# Patient Record
Sex: Female | Born: 1965 | Race: White | Hispanic: No | Marital: Married | Smoking: Former smoker
Health system: Southern US, Academic
[De-identification: ages and names within clinical notes are randomized; demographics above are authoritative.]

## PROBLEM LIST (undated history)

## (undated) DIAGNOSIS — E079 Disorder of thyroid, unspecified: Secondary | ICD-10-CM

## (undated) DIAGNOSIS — N649 Disorder of breast, unspecified: Secondary | ICD-10-CM

## (undated) DIAGNOSIS — N6009 Solitary cyst of unspecified breast: Secondary | ICD-10-CM

## (undated) DIAGNOSIS — M3500 Sicca syndrome, unspecified: Secondary | ICD-10-CM

## (undated) DIAGNOSIS — K219 Gastro-esophageal reflux disease without esophagitis: Secondary | ICD-10-CM

## (undated) DIAGNOSIS — Z658 Other specified problems related to psychosocial circumstances: Secondary | ICD-10-CM

## (undated) DIAGNOSIS — I38 Endocarditis, valve unspecified: Secondary | ICD-10-CM

## (undated) DIAGNOSIS — M1990 Unspecified osteoarthritis, unspecified site: Secondary | ICD-10-CM

## (undated) DIAGNOSIS — E039 Hypothyroidism, unspecified: Secondary | ICD-10-CM

## (undated) HISTORY — DX: Solitary cyst of unspecified breast: N60.09

## (undated) HISTORY — DX: Disorder of breast, unspecified: N64.9

## (undated) HISTORY — DX: Hypothyroidism, unspecified: E03.9

## (undated) HISTORY — DX: Other specified problems related to psychosocial circumstances: Z65.8

## (undated) HISTORY — DX: Sjogren syndrome, unspecified (CMS HCC): M35.00

## (undated) HISTORY — PX: HX DILATION AND CURETTAGE: SHX78

## (undated) HISTORY — PX: HX CYST INCISION AND DRAINAGE: SHX14

## (undated) HISTORY — PX: HX BREAST BIOPSY: SHX20

---
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
## 1994-08-27 ENCOUNTER — Ambulatory Visit (HOSPITAL_COMMUNITY): Payer: Self-pay | Admitting: Internal Medicine

## 1996-12-21 ENCOUNTER — Other Ambulatory Visit: Payer: Self-pay

## 1999-10-22 DIAGNOSIS — N879 Dysplasia of cervix uteri, unspecified: Secondary | ICD-10-CM

## 1999-10-22 HISTORY — DX: Dysplasia of cervix uteri, unspecified: N87.9

## 1999-11-29 ENCOUNTER — Ambulatory Visit (INDEPENDENT_AMBULATORY_CARE_PROVIDER_SITE_OTHER): Payer: Self-pay

## 1999-12-04 ENCOUNTER — Ambulatory Visit (INDEPENDENT_AMBULATORY_CARE_PROVIDER_SITE_OTHER): Payer: Self-pay | Admitting: RHEUMATOLOGY

## 1999-12-27 ENCOUNTER — Ambulatory Visit (INDEPENDENT_AMBULATORY_CARE_PROVIDER_SITE_OTHER): Payer: Self-pay | Admitting: RHEUMATOLOGY

## 2000-06-19 ENCOUNTER — Ambulatory Visit (HOSPITAL_BASED_OUTPATIENT_CLINIC_OR_DEPARTMENT_OTHER): Payer: Self-pay

## 2001-06-01 ENCOUNTER — Ambulatory Visit (HOSPITAL_BASED_OUTPATIENT_CLINIC_OR_DEPARTMENT_OTHER): Payer: Self-pay

## 2001-06-01 ENCOUNTER — Other Ambulatory Visit: Payer: Self-pay

## 2001-06-01 ENCOUNTER — Ambulatory Visit (INDEPENDENT_AMBULATORY_CARE_PROVIDER_SITE_OTHER): Payer: Self-pay

## 2001-06-11 ENCOUNTER — Ambulatory Visit (HOSPITAL_BASED_OUTPATIENT_CLINIC_OR_DEPARTMENT_OTHER): Payer: Self-pay

## 2002-03-22 ENCOUNTER — Ambulatory Visit (INDEPENDENT_AMBULATORY_CARE_PROVIDER_SITE_OTHER): Payer: Self-pay

## 2002-03-23 ENCOUNTER — Other Ambulatory Visit: Payer: Self-pay

## 2002-03-23 ENCOUNTER — Ambulatory Visit (HOSPITAL_BASED_OUTPATIENT_CLINIC_OR_DEPARTMENT_OTHER): Payer: Self-pay

## 2002-04-26 ENCOUNTER — Ambulatory Visit (HOSPITAL_BASED_OUTPATIENT_CLINIC_OR_DEPARTMENT_OTHER): Payer: Self-pay

## 2002-04-29 ENCOUNTER — Ambulatory Visit (HOSPITAL_BASED_OUTPATIENT_CLINIC_OR_DEPARTMENT_OTHER): Payer: Self-pay

## 2003-05-27 ENCOUNTER — Ambulatory Visit (INDEPENDENT_AMBULATORY_CARE_PROVIDER_SITE_OTHER): Payer: Self-pay

## 2003-06-06 ENCOUNTER — Ambulatory Visit (INDEPENDENT_AMBULATORY_CARE_PROVIDER_SITE_OTHER): Payer: Self-pay

## 2003-06-15 ENCOUNTER — Ambulatory Visit (INDEPENDENT_AMBULATORY_CARE_PROVIDER_SITE_OTHER): Payer: Self-pay

## 2003-09-29 ENCOUNTER — Ambulatory Visit (INDEPENDENT_AMBULATORY_CARE_PROVIDER_SITE_OTHER): Payer: Self-pay

## 2003-10-12 ENCOUNTER — Ambulatory Visit (INDEPENDENT_AMBULATORY_CARE_PROVIDER_SITE_OTHER): Payer: Self-pay

## 2003-10-12 HISTORY — PX: BIOPSY BREAST: PRO8

## 2004-06-06 ENCOUNTER — Ambulatory Visit (INDEPENDENT_AMBULATORY_CARE_PROVIDER_SITE_OTHER): Payer: Self-pay

## 2004-12-10 ENCOUNTER — Other Ambulatory Visit (INDEPENDENT_AMBULATORY_CARE_PROVIDER_SITE_OTHER): Payer: Self-pay

## 2004-12-28 ENCOUNTER — Other Ambulatory Visit (INDEPENDENT_AMBULATORY_CARE_PROVIDER_SITE_OTHER): Payer: Self-pay

## 2005-09-09 ENCOUNTER — Ambulatory Visit (INDEPENDENT_AMBULATORY_CARE_PROVIDER_SITE_OTHER): Payer: Self-pay

## 2006-08-25 ENCOUNTER — Ambulatory Visit (HOSPITAL_COMMUNITY): Payer: Self-pay

## 2006-09-04 ENCOUNTER — Ambulatory Visit (HOSPITAL_BASED_OUTPATIENT_CLINIC_OR_DEPARTMENT_OTHER): Payer: Self-pay

## 2007-08-12 ENCOUNTER — Other Ambulatory Visit (INDEPENDENT_AMBULATORY_CARE_PROVIDER_SITE_OTHER): Payer: Self-pay

## 2007-09-28 ENCOUNTER — Ambulatory Visit (HOSPITAL_BASED_OUTPATIENT_CLINIC_OR_DEPARTMENT_OTHER): Payer: No Typology Code available for payment source

## 2007-09-28 ENCOUNTER — Ambulatory Visit (INDEPENDENT_AMBULATORY_CARE_PROVIDER_SITE_OTHER)
Admission: RE | Admit: 2007-09-28 | Discharge: 2007-09-28 | Disposition: A | Discharge status: Home / Self Care | Payer: No Typology Code available for payment source | Source: Ambulatory Visit

## 2007-09-28 ENCOUNTER — Ambulatory Visit (INDEPENDENT_AMBULATORY_CARE_PROVIDER_SITE_OTHER): Payer: No Typology Code available for payment source

## 2007-09-28 ENCOUNTER — Ambulatory Visit
Admission: RE | Admit: 2007-09-28 | Discharge: 2007-09-28 | Disposition: A | Discharge status: Home / Self Care | Payer: No Typology Code available for payment source

## 2007-09-28 DIAGNOSIS — Z124 Encounter for screening for malignant neoplasm of cervix: Secondary | ICD-10-CM

## 2007-09-28 DIAGNOSIS — N6009 Solitary cyst of unspecified breast: Secondary | ICD-10-CM

## 2007-09-28 DIAGNOSIS — Z01419 Encounter for gynecological examination (general) (routine) without abnormal findings: Secondary | ICD-10-CM

## 2007-10-01 LAB — CYTOPATHOLOGY, GYN +/- HIGH RISK HPV

## 2007-11-03 ENCOUNTER — Ambulatory Visit (HOSPITAL_BASED_OUTPATIENT_CLINIC_OR_DEPARTMENT_OTHER): Payer: No Typology Code available for payment source | Admitting: GENERAL SURGERY

## 2008-01-27 ENCOUNTER — Encounter (EMERGENCY_DEPARTMENT_HOSPITAL): Payer: No Typology Code available for payment source | Admitting: Internal Medicine

## 2008-01-27 ENCOUNTER — Observation Stay
Admission: EM | Admit: 2008-01-27 | Discharge: 2008-01-28 | Disposition: A | Discharge status: Home / Self Care | Payer: No Typology Code available for payment source | Source: Emergency Department | Attending: Internal Medicine | Admitting: Internal Medicine

## 2008-01-27 ENCOUNTER — Encounter (HOSPITAL_COMMUNITY): Payer: Self-pay

## 2008-01-27 ENCOUNTER — Emergency Department (EMERGENCY_DEPARTMENT_HOSPITAL): Payer: No Typology Code available for payment source

## 2008-01-27 DIAGNOSIS — Z87891 Personal history of nicotine dependence: Secondary | ICD-10-CM | POA: Insufficient documentation

## 2008-01-27 DIAGNOSIS — I471 Supraventricular tachycardia, unspecified: Secondary | ICD-10-CM | POA: Insufficient documentation

## 2008-01-27 DIAGNOSIS — E039 Hypothyroidism, unspecified: Secondary | ICD-10-CM | POA: Insufficient documentation

## 2008-01-27 DIAGNOSIS — M3500 Sicca syndrome, unspecified: Secondary | ICD-10-CM | POA: Insufficient documentation

## 2008-01-27 DIAGNOSIS — Z7982 Long term (current) use of aspirin: Secondary | ICD-10-CM | POA: Insufficient documentation

## 2008-01-27 DIAGNOSIS — E785 Hyperlipidemia, unspecified: Secondary | ICD-10-CM | POA: Insufficient documentation

## 2008-01-27 DIAGNOSIS — R072 Precordial pain: Principal | ICD-10-CM | POA: Insufficient documentation

## 2008-01-27 LAB — CBC/DIFF
BASOPHILS: 1 % (ref 0–1)
BASOS ABS: 0.054 THOU/uL (ref 0.0–0.2)
EOS ABS: 0.112 THOU/uL (ref 0.1–0.3)
EOSINOPHIL: 2 % (ref 1–6)
HCT: 39.8 % (ref 33.5–45.2)
HGB: 13.9 g/dL (ref 11.2–15.2)
LYMPHOCYTES: 24 % (ref 20–45)
LYMPHS ABS: 1.27 THOU/uL (ref 1.0–4.8)
MCH: 30.5 pg (ref 27.4–33.0)
MCHC: 34.8 g/dL (ref 31.6–35.5)
MCV: 87.6 fL (ref 78–100)
MONOCYTES: 9 % (ref 4–13)
MONOS ABS: 0.508 10*3/uL (ref 0.1–0.9)
MPV: 9.1 FL (ref 7.4–10.4)
PLATELET COUNT: 212 10*3/uL (ref 140–450)
PMN ABS: 3.42 THOU/uL (ref 1.3–7.7)
PMN'S: 64 % (ref 40–75)
RBC: 4.55 MIL/uL (ref 3.84–5.04)
RDW: 11.8 % (ref 11.5–14.5)
WBC: 5.4 THOU/UL (ref 3.5–11.0)

## 2008-01-27 LAB — THYROID STIMULATING HORMONE (SENSITIVE TSH): TSH: 6.597 u[IU]/mL — ABNORMAL HIGH (ref 0.300–5.900)

## 2008-01-27 LAB — BASIC METABOLIC PANEL
BUN/CREAT RATIO: 13 (ref 6–22)
BUN: 11 mg/dL (ref 6–20)
POTASSIUM: 3 mmol/L — ABNORMAL LOW (ref 3.5–5.1)

## 2008-01-27 LAB — PT/INR
INR: 0.9 (ref 0.8–1.2)
PROTHROMBIN TIME: 9.6 s (ref 9.1–11.2)

## 2008-01-27 LAB — CREATINE KINASE (CK), TOTAL, SERUM OR PLASMA: CREATINE KINASE (CK): 47 U/L (ref 24–170)

## 2008-01-27 LAB — PTT (PARTIAL THROMBOPLASTIN TIME): APTT: 24.7 s (ref 22.5–32.0)

## 2008-01-27 LAB — LIPID PANEL
CHOLESTEROL: 223 mg/dL — ABNORMAL HIGH (ref <200)
LDL (CALCULATED): 149 mg/dL — ABNORMAL HIGH (ref <100)
TRIGLYCERIDES: 97 mg/dL (ref <150)

## 2008-01-27 LAB — CREATINE KINASE (CK), MB FRACTION, SERUM
CK-MB: 0.6 ng/mL (ref <6.4)
MB INDEX: 1.3 % (ref 0.0–5.0)

## 2008-01-27 LAB — TROPONIN-I (FOR ED ONLY): TROPONIN-I: <0.01 ng/mL (ref <0.050)

## 2008-01-27 MED ORDER — ACETAMINOPHEN 325 MG TABLET
325.00 mg | ORAL_TABLET | ORAL | Status: DC | PRN
Start: 2008-01-27 — End: 2008-01-29

## 2008-01-27 MED ORDER — ASPIRIN 81 MG CHEWABLE TABLET
81.0000 mg | CHEWABLE_TABLET | Freq: Every day | ORAL | Status: DC
Start: 2008-01-28 — End: 2008-01-29
  Administered 2008-01-28: 81 mg via ORAL
  Filled 2008-01-27: qty 1

## 2008-01-27 MED ORDER — CYCLOSPORINE 0.05 % EYE DROPS IN A DROPPERETTE
1.0000 [drp] | Freq: Two times a day (BID) | OPHTHALMIC | Status: DC
Start: 2008-01-27 — End: 2008-01-29
  Administered 2008-01-28 (×2): 1 [drp] via OPHTHALMIC
  Filled 2008-01-27 (×3): qty 32

## 2008-01-27 MED ORDER — SODIUM CHLORIDE 0.9 % INTRAVENOUS SOLUTION
INTRAVENOUS | Status: DC
Start: 2008-01-27 — End: 2008-01-29

## 2008-01-27 MED ORDER — LEVOTHYROXINE 25 MCG TABLET
25.0000 ug | ORAL_TABLET | Freq: Every morning | ORAL | Status: DC
Start: 2008-01-28 — End: 2008-01-28
  Administered 2008-01-28 (×2): 0 ug via ORAL
  Filled 2008-01-27 (×2): qty 1

## 2008-01-27 MED ORDER — SIMVASTATIN 20 MG TABLET
40.0000 mg | ORAL_TABLET | Freq: Every evening | ORAL | Status: DC
Start: 2008-01-28 — End: 2008-01-29
  Administered 2008-01-28: 0 mg via ORAL
  Filled 2008-01-27 (×2): qty 2

## 2008-01-27 MED ORDER — POTASSIUM CHLORIDE ER 20 MEQ TABLET,EXTENDED RELEASE(PART/CRYST)
60.00 meq | ORAL_TABLET | Freq: Once | ORAL | Status: AC
Start: 2008-01-27 — End: 2008-01-27
  Administered 2008-01-27: 60 meq via ORAL
  Filled 2008-01-27: qty 3

## 2008-01-27 MED ORDER — ESOMEPRAZOLE MAGNESIUM 40 MG CAPSULE,DELAYED RELEASE
40.0000 mg | DELAYED_RELEASE_CAPSULE | Freq: Every morning | ORAL | Status: DC
Start: 2008-01-28 — End: 2008-01-29
  Administered 2008-01-28: 40 mg via ORAL
  Filled 2008-01-27 (×2): qty 1

## 2008-01-27 MED ORDER — FLECAINIDE 100 MG TABLET
50.0000 mg | ORAL_TABLET | Freq: Two times a day (BID) | ORAL | Status: DC
Start: 2008-01-28 — End: 2008-01-29
  Administered 2008-01-28: 50 mg via ORAL
  Filled 2008-01-27 (×2): qty 0.5

## 2008-01-27 MED ORDER — NITROGLYCERIN 0.4 MG SUBLINGUAL TABLET
0.4000 mg | SUBLINGUAL_TABLET | SUBLINGUAL | Status: DC | PRN
Start: 2008-01-27 — End: 2008-01-29

## 2008-01-27 MED ORDER — PILOCARPINE 5 MG TABLET
7.50 mg | ORAL_TABLET | Freq: Two times a day (BID) | ORAL | Status: DC
Start: 2008-01-28 — End: 2008-01-29
  Administered 2008-01-28 (×2): 7.5 mg via ORAL
  Filled 2008-01-27 (×4): qty 5

## 2008-01-27 MED ORDER — HEPARIN (PORCINE) 5,000 UNIT/ML INJECTION SOLUTION
5000.0000 [IU] | Freq: Three times a day (TID) | INTRAMUSCULAR | Status: DC
Start: 2008-01-28 — End: 2008-01-29
  Administered 2008-01-28: 5000 [IU] via SUBCUTANEOUS
  Administered 2008-01-28: 0 [IU] via SUBCUTANEOUS
  Filled 2008-01-27 (×4): qty 1

## 2008-01-27 MED ORDER — ALUMINUM-MAG HYDROXIDE-SIMETHICONE 400 MG-400 MG-40 MG/5 ML ORAL SUSP
10.00 mL | ORAL | Status: DC | PRN
Start: 2008-01-27 — End: 2008-01-29

## 2008-01-27 MED ORDER — VITAMIN E 268 MG (400 UNIT) CAPSULE
400.0000 [IU] | ORAL_CAPSULE | Freq: Two times a day (BID) | ORAL | Status: DC
Start: 2008-01-28 — End: 2008-01-29
  Administered 2008-01-28: 11:00:00 400 [IU] via ORAL
  Administered 2008-01-28: 0 [IU] via ORAL
  Filled 2008-01-27 (×3): qty 1

## 2008-01-27 NOTE — ED Attending Handoff Note (Signed)
40 female CP    Admittted to medicine

## 2008-01-27 NOTE — ED Nurses Note (Signed)
2000 #20 heplock inserted in LAC.  Labs drawn and urine specimen obtained and sent to lab.  States shaking started but now no longer shaking.  Some numbness on left jaw starting at 1630.  Now going away.  Had 2 81 mg ASA.  Family at bsd.  Also had dry cough, started when numbness started.  Has non-producing tear ducts, saliva glands.    2100  Dr in to evaluate pt who is awaiting further disposition.  Family with pt.  No further shaking and pt denies distress or pain at this time.    2300  Awaiting transfer to floor.    Note Created by Lorri Frederick, RN - Pended Note filed incomplete by provider for HIM Michelyn Scullin.

## 2008-01-27 NOTE — ED Resident Handoff Note (Signed)
CP left sided with associated syx  No previous work up  Flecainide for arrythmia  Most likely CP admission  (find out PCP)    Medicine consult in progress      Admitted to medicine    I have monitored the events of this patient with no acute events.  Management plan executed as indicated from primary resident plan.

## 2008-01-27 NOTE — ED Provider Notes (Signed)
HPI  CC: 42 y.o. female presents to the ED on 01/27/2008 w/ Chest Pain (Angina)      BP 140/80   Pulse 56   Temp 36.8 C (98.2 F)   Resp 18   Ht 1.702 m (5\' 7" )   Wt 74.844 kg (165 lb)   SpO2 100%   LMP 01/27/2008     HPI: Patient states that today while she was at work at 4:30 PM she developed a left-sided chest pressure with some sharp component going back into her left scapula as well as in her left arm. She also developed some nausea and some lightheadedness. She took some baby aspirin at home and took a flecainide pill that she is prescribed for a heart arrhythmia. Her husband then brought her to the emergency department. She has had the pressure component her chest before but never the sharp component. She does have a strong family history of coronary artery disease at young age mostly in the men.  He just states at one point she developed some numbness in her left face. She wasn't sure if this was related to her TMJ or Sjogren's syndrome.        ROS  Constitutional: no fever, no chills.   Skin: Negative for rash and itching.   HENT: no headaches, no nosebleeds.    Eyes: no blurred vision, no double vision.   Cardiovascular: + chest pain, no palpitations.   Respiratory: + shortness of breath, no wheezing.   Gastrointestinal: + nausea, no vomiting, no abdominal pain.   Musculoskeletal: no neck pain, no back pain.   All other systems reviewed and are negative, except those noted in HPI.        History:  Past Medical History   Diagnosis Date    Chest Pain 01/28/2008     Some sort of arrhythmia which she cannot identify.    Past Surgical History   Procedure Date    Hx tonsillectomy           MEDS: Previous Medications    ALPHA-LIPOIC ACID PO    take by mouth.     AMILORIDE-HYDROCHLOROTHIAZIDE 5-50 MG TAB    take 1 Tab by mouth Once a day.     ASPIRIN 81 MG CHEW    take 81 mg by mouth Once a day.     FLECAINIDE (TAMBOCOR) 50 MG TAB    take 50 mg by mouth Twice daily.     KLOR-CON M20 ORAL    take by mouth.     PILOCARPINE HCL 7.5 MG TAB    take by mouth.     SUPER B COMPLEX PO    take by mouth.     VITAMIN E (AQUASOL E) 400 UNIT CAP    take 400 Units by mouth Once a day.     ZINC 50 MG TAB    take by mouth.             ALLERGIES: Tetracycline     SocHx: History   Substance Use Topics    Tobacco Use: Never    Alcohol Use: No                  Physical Exam  Nursing chart/notes reviewed.    Blood pressure 91/53, pulse 58, temperature 36.6 C (97.9 F), resp. rate 20, height 1.753 m (5\' 9" ), weight 76.9 kg (169 lb 8.5 oz), last menstrual period 01/27/2008, SpO2 99%.   Constitutional: Pt is oriented. Pt appears well-developed and well-nourished.  HENT:    Head: Normocephalic and atraumatic.     Ears: External ears normal.    Eyes: Conjunctivae and extraocular motions are normal. Pupils are equal, round, and   reactive to light.   Neck: Normal range of motion. Neck supple.   Cardiovascular: Normal rate, regular rhythm, normal heart sounds and intact distal pulses.    NoO. murmur heard.  Pulmonary/Chest: Pt has no respiratory distress. Pt has no wheezes. Pt has no rales.   Abdominal: Bowel sounds are normal. Pt exhibits no distension.  no tenderness to palpation. no rebound/guarding.  Musculoskeletal: Normal range of motion. Pt exhibits no LE edema. noa TTP.    Neurological: Pt is alert and oriented. Cranial nerves intact.  Motor and sensory intact.  Skin: Skin is warm and dry.   Psychiatric: Pt has a normal mood and affect. Their behavior is normal.        Course  Eval/tests/treatment:     Cardiac protocol, patient has already receive aspirin.  Meds that were given are noted on the [facility].    Results:  All test results reviewed.  Troponin negative, EKG normal, chest x-ray normal, potassium 3.0    Re-eval and treatment included:     60 mEq oral potassium    MDM:  Patient is a 42 year old female with chest pain is concerning for cardiac cause. She has a strong family history, but lacks any other risk factors. Due to the strong  family history believe that it admission at this time would be the appropriate disposition. She has a ready received aspirin, do to her low risk we will not begin anticoagulation at this time.    medicine called regarding admission to their service for following reasons:  Chest pain

## 2008-01-27 NOTE — ED Attending Note (Signed)
Note begun by:  Serina Cowper, MD 01/27/2008, 8:43 PM    I was physically present and directly supervised this patient's care.  Patient seen and examined with Dr Baltazar Apo.  Resident history and exam reviewed.   Key elements in addition to and/or correction of that documentation are as follows:    HPI :    42 y.o. female presents with chief complaint of chest pain. This is described as a tightness associated with nausea and shortness of breath. Radiates to L arm. Family history of CAD.  No other risk factors.     PE :   VS on presentation: Blood pressure 140/80, pulse 56, temperature 36.8 C (98.2 F), resp. rate 18, height 1.702 m (5\' 7" ), weight 74.844 kg (165 lb), last menstrual period 01/27/2008, SpO2 100%.   I have seen and examined with Dr Baltazar Apo and agree with his exam    Data/Test :    EKG : see ECG  Images Review by me : CXR  Image Reports Review by me : As above  Labs : see list    Review of Prior Data :       Prior Images : None  Prior EKG : None  Online Medical Records : [facility]  Transfer Docs/Images : None    Clinical Impression :   1. Chest pain.        ED Course :   Per Dr Colonel Bald note     Plan :   Per Dr Otho Perl note    Dispo :   Per Dr Otho Perl note    CRITICAL CARE : None

## 2008-01-28 ENCOUNTER — Encounter (HOSPITAL_COMMUNITY): Payer: Self-pay

## 2008-01-28 ENCOUNTER — Inpatient Hospital Stay (HOSPITAL_COMMUNITY): Payer: No Typology Code available for payment source

## 2008-01-28 DIAGNOSIS — R079 Chest pain, unspecified: Secondary | ICD-10-CM

## 2008-01-28 HISTORY — DX: Chest pain, unspecified: R07.9

## 2008-01-28 LAB — CBC/DIFF
BASOPHILS: 0 % (ref 0–1)
BASOS ABS: 0.023 THOU/uL (ref 0.0–0.2)
EOS ABS: 0.114 THOU/uL (ref 0.1–0.3)
EOSINOPHIL: 2 % (ref 1–6)
HCT: 35.4 % (ref 33.5–45.2)
HGB: 12.1 g/dL (ref 11.2–15.2)
LYMPHOCYTES: 26 % (ref 20–45)
LYMPHS ABS: 1.39 THOU/uL (ref 1.0–4.8)
MCH: 30.1 pg (ref 27.4–33.0)
MCHC: 34.2 g/dL (ref 31.6–35.5)
MCV: 88 fL (ref 78–100)
MONOCYTES: 10 % (ref 4–13)
MONOS ABS: 0.562 THOU/uL (ref 0.1–0.9)
MPV: 9.7 FL (ref 7.4–10.4)
PLATELET COUNT: 203 THO/UL (ref 140–450)
PMN ABS: 3.32 THOU/uL (ref 1.3–7.7)
PMN'S: 62 % (ref 40–75)
RBC: 4.02 MIL/uL (ref 3.84–5.04)
RDW: 11.8 % (ref 11.5–14.5)
WBC: 5.4 THOU/UL (ref 3.5–11.0)

## 2008-01-28 LAB — ELECTROLYTES
CARBON DIOXIDE: 28 mmol/L (ref 22–32)
CHLORIDE: 109 mmol/L (ref 96–111)
SODIUM: 142 mmol/L (ref 136–145)

## 2008-01-28 LAB — THYROID STIMULATING HORMONE WITH FREE T4 REFLEX: THYROID STIMULATING HORMONE WITH FREE T4 REFLEX: 6.749 u[IU]/mL — ABNORMAL HIGH (ref 0.300–5.900)

## 2008-01-28 LAB — H & H: HGB: 12.2 g/dL (ref 11.2–15.2)

## 2008-01-28 LAB — CREATINE KINASE (CK), MB FRACTION, SERUM: CK-MB: 0.4 ng/mL (ref <6.4)

## 2008-01-28 LAB — PHOSPHORUS: PHOSPHORUS: 3.8 mg/dL (ref 2.4–4.7)

## 2008-01-28 LAB — T UPTAKE: THYROID UPTAKE: 35.5 % (ref 32.0–49.0)

## 2008-01-28 LAB — CREATININE
CREATININE: 0.96 mg/dL (ref 0.49–1.10)
ESTIMATED GLOMERULAR FILTRATION RATE: >59 mL/min/{1.73_m2} (ref >59)

## 2008-01-28 LAB — CREATINE KINASE (CK), TOTAL, SERUM: CREATINE KINASE (CK): 39 U/L (ref 24–170)

## 2008-01-28 LAB — PTT (PARTIAL THROMBOPLASTIN TIME): APTT: 24.6 s (ref 22.5–32.0)

## 2008-01-28 LAB — BUN: BUN/CREAT RATIO: 13 (ref 6–22)

## 2008-01-28 LAB — TROPONIN-I: TROPONIN-I: <0.01 ng/mL (ref <0.050)

## 2008-01-28 LAB — MAGNESIUM: MAGNESIUM: 2.1 mg/dL (ref 1.7–2.5)

## 2008-01-28 LAB — CALCIUM: CALCIUM: 8.7 mg/dL (ref 8.5–10.4)

## 2008-01-28 MED ORDER — IBUPROFEN 400 MG TABLET
400.0000 mg | ORAL_TABLET | Freq: Four times a day (QID) | ORAL | Status: DC | PRN
Start: 2008-01-28 — End: 2008-01-29
  Filled 2008-01-28 (×2): qty 1

## 2008-01-28 NOTE — Nurses Notes (Signed)
Pt discharged to home, accompanied by husband.  Discharge instructions given and pt questions answered.

## 2008-01-29 NOTE — Discharge Summary (Signed)
[facility]                                 DEPARTMENT OF MEDICINE                                         DISCHARGE SUMMARY    PATIENT NAME: Tracie [name], Tracie [name]  HOSPITAL BJYNWG:[id number]  DATE OF BIRTH: 1966-08-16    ADMISSION DATE:01/27/2008  DISCHARGE DATE:01/28/2008    PRIMARY CARE PHYSICIAN:  Hayden Rasmussen MD    REFERRING PHYSICIAN:  Jackey Loge MD    DISCHARGE DIAGNOSES:  1.  Chest pain, ruled out for myocardial infarction with negative cardiac enzymes.  2.  Sjogren syndrome.  3.  Hyperlipidemia.  4.  Paroxysmal atrial tachycardia.    DISCHARGE MEDICATIONS:   No new medications were added to the patient's regimen:  1.. Amiloride/hydrochlorothiazide 5/50 one tablet once a day.    2.  Aspirin 81 mg p.o. daily.  3.  Flecainide 50 mg p.o. daily.  4.  Pilocarpine 7.5 mg p.o. daily.  5.  Klor-Con 20 mg p.o. daily.  6.  Vitamin E 400 units.  7.  Zinc.   8.  Super B.   9.  Alpha-Lipoic acid p.o.    KEY TESTS:    1.  Nuclear medicine MPS.   2.  D-dimer.     REASON FOR HOSPITALIZATION AND HOSPITAL COURSE:  The patient is a pleasant 42 year old white female with hyperlipidemia and positive family history of coronary artery disease who presented with substernal stabbing chest pain that has been going on for a while off and on but was not that severe.  This was not relieved by nitroglycerin in the Emergency Department. She was ruled out and admitted for an MI with serial negative cardiac enzymes.  Her EKG did not show any evidence of ischemia.  Her lipid panel showed cholesterol 223, LDL 149, however it did not look like it was a pure fasting study so we would suggest repeat the evaluation.  She underwent a myocardial perfusion scan the next day which showed a normal MPS without evidence of stress-induced ischemia, normal left ventricular ejection fraction.  Her TSH was 6.5 with a normal T4 and T3 uptake and hence was consistent with sick euthyroid.   She also underwent d-dimer since she had some shortness of breath and she is relatively young with a history of autoimmune disorders.  It was 1.1, has a high negative predictive value we considered it safe enough to rule out PE.  She will follow up with Dr. Seward Grater, appointment is already scheduled in the middle of May.  She did not exactly remember the date.  She is going to see Dr. Jackey Loge, her primary cardiologist who is planning to take her off of flecainide and see the response.  When she was admitted here, she was in normal sinus rhythm.      Danisha Brassfield, MD  Resident  [facility]    Genella Mech, MD  Associate Professor  [facility]    AN/lel/[id number];D: 01/28/2008 17:54:31; T: 01/29/2008 10:38:19    cc: Quentin Angst MD      [facility] [address]      [place], [place] [address]  Hayden Rasmussen MD      [address]       [place], [place] [address]

## 2008-05-18 ENCOUNTER — Ambulatory Visit
Admission: RE | Admit: 2008-05-18 | Discharge: 2008-05-18 | Disposition: A | Discharge status: Home / Self Care | Payer: No Typology Code available for payment source

## 2008-05-18 ENCOUNTER — Ambulatory Visit (INDEPENDENT_AMBULATORY_CARE_PROVIDER_SITE_OTHER): Payer: No Typology Code available for payment source

## 2008-05-18 ENCOUNTER — Other Ambulatory Visit (INDEPENDENT_AMBULATORY_CARE_PROVIDER_SITE_OTHER): Payer: Self-pay

## 2008-05-18 ENCOUNTER — Ambulatory Visit (INDEPENDENT_AMBULATORY_CARE_PROVIDER_SITE_OTHER): Payer: Self-pay

## 2008-05-18 DIAGNOSIS — N920 Excessive and frequent menstruation with regular cycle: Secondary | ICD-10-CM

## 2008-05-18 LAB — CBC
MCH: 29.5 pg (ref 27.4–33.0)
MCHC: 34.4 g/dL (ref 31.6–35.5)
MCV: 85.6 fL (ref 78–100)

## 2008-05-18 LAB — PROLACTIN: PROLACTIN: 4.9 ng/mL (ref 3–27)

## 2008-05-18 LAB — THYROID STIMULATING HORMONE (SENSITIVE TSH): TSH: 2.781 u[IU]/mL (ref 0.300–5.900)

## 2008-05-18 NOTE — Telephone Encounter (Signed)
Triage Queue message copied by Jackelyn Poling on Wed May 18, 2008 3:18 PM  ------   Message from: Barkley Bruns   Created: Wed May 18, 2008 3:12 PM    >> Barkley Bruns Wed May 18, 2008 3:12 pm  [place]. Patient needs to have a mammogram order sent over to [facility]. She isn't due until December but has a specific date she needs to request.

## 2008-05-18 NOTE — Telephone Encounter (Signed)
Called pt and informed her that the order was already sent to the [facility] care center.

## 2008-05-19 ENCOUNTER — Telehealth (HOSPITAL_BASED_OUTPATIENT_CLINIC_OR_DEPARTMENT_OTHER): Payer: Self-pay

## 2008-05-19 ENCOUNTER — Other Ambulatory Visit (HOSPITAL_BASED_OUTPATIENT_CLINIC_OR_DEPARTMENT_OTHER): Payer: Self-pay

## 2008-05-19 LAB — THYROXINE, FREE (FREE T4): THYROXINE, FREE (FREE T4): 0.87 ng/dL (ref 0.60–1.10)

## 2008-05-19 NOTE — Telephone Encounter (Signed)
Called patient and reviewed lab results:  TSH elevated.    Plan:  Add Free T 4 test to blood in lab            Start Synthroid one po daily #30 RF #1  (called at [facility] pharmacy in [place] at 1:10pm)            Lab slip maile to home address for repeat labs in 4 weeks (06/20/08)    Pc.

## 2008-05-19 NOTE — Progress Notes (Signed)
Cannot order - access denied.  Pc.

## 2008-06-02 ENCOUNTER — Other Ambulatory Visit (INDEPENDENT_AMBULATORY_CARE_PROVIDER_SITE_OTHER): Payer: No Typology Code available for payment source

## 2008-06-08 ENCOUNTER — Other Ambulatory Visit (INDEPENDENT_AMBULATORY_CARE_PROVIDER_SITE_OTHER): Payer: No Typology Code available for payment source

## 2008-06-08 ENCOUNTER — Ambulatory Visit (INDEPENDENT_AMBULATORY_CARE_PROVIDER_SITE_OTHER): Payer: Self-pay

## 2008-06-17 ENCOUNTER — Telehealth (INDEPENDENT_AMBULATORY_CARE_PROVIDER_SITE_OTHER): Payer: Self-pay

## 2008-06-17 ENCOUNTER — Ambulatory Visit
Admission: RE | Admit: 2008-06-17 | Discharge: 2008-06-17 | Disposition: A | Discharge status: Home / Self Care | Payer: No Typology Code available for payment source

## 2008-06-17 LAB — THYROID STIMULATING HORMONE (SENSITIVE TSH): TSH: 2.758 u[IU]/mL (ref 0.300–5.900)

## 2008-06-17 NOTE — Telephone Encounter (Signed)
Menometrorrhagia history; pelvic USG shows complex hemorrhagic cyst on left with thickened endo metrium 10.96.    Plan:  Endometrial biopsy next available.  Pc.

## 2008-06-20 ENCOUNTER — Ambulatory Visit (INDEPENDENT_AMBULATORY_CARE_PROVIDER_SITE_OTHER): Payer: Self-pay

## 2008-06-20 NOTE — Telephone Encounter (Signed)
Triage Queue message copied by Milas Kocher on Mon Jun 20, 2008 9:14 AM  ------   Message from: Holly Bodily   Created: Mon Jun 20, 2008 9:02 AM    >> Holly Bodily JWJ Jun 20, 2008 9:02 am  [facility] pt - The patient stated she had blood work done on 8.28.09 to check her thyroid. She is wanting to know if Pam wants her to continue taking the medication because she is going out of town on Wednesday and she will need a refill called into [facility] in [place]. Please advise pt at [phone]. Thank you

## 2008-06-20 NOTE — Telephone Encounter (Signed)
Reviewed all lab results since 01/27/08 with patient; Synthroid . started after 05/18/08 draw (WNL); last draw 06/17/08 - WNL.  Patient symptoms fatique and vasomotor sx's improved?    Plan:  D/C Synthroid for now;  Re-evaluate at next visit (07/13/08).    Carman Ching, NP

## 2008-07-13 ENCOUNTER — Other Ambulatory Visit (INDEPENDENT_AMBULATORY_CARE_PROVIDER_SITE_OTHER): Payer: Self-pay

## 2008-07-13 ENCOUNTER — Ambulatory Visit (INDEPENDENT_AMBULATORY_CARE_PROVIDER_SITE_OTHER): Payer: No Typology Code available for payment source

## 2008-07-13 ENCOUNTER — Other Ambulatory Visit
Admission: RE | Admit: 2008-07-13 | Discharge: 2008-07-13 | Disposition: A | Discharge status: Home / Self Care | Payer: No Typology Code available for payment source

## 2008-07-13 DIAGNOSIS — N920 Excessive and frequent menstruation with regular cycle: Secondary | ICD-10-CM | POA: Insufficient documentation

## 2008-07-13 DIAGNOSIS — N83209 Unspecified ovarian cyst, unspecified side: Secondary | ICD-10-CM

## 2008-07-13 NOTE — Progress Notes (Addendum)
Addended by: Carman Ching on: 07/13/2008 1:18:30 PM     Modules accepted: Orders

## 2008-07-13 NOTE — Progress Notes (Addendum)
Addended by: Carman Ching on: 07/13/2008 1:24:12 PM     Modules accepted: Orders, SmartSet

## 2008-07-13 NOTE — Progress Notes (Signed)
Subjective:     Patient ID:  Anyah Swallow is an 42 y.o. female   Chief Complaint:  Chief Complaint   Patient presents with   . Biopsy     endometrial           HPI  Menometrorrhgia HX in 5/09  06/03/08 Pelvic USG:  Endometrium 10.96; LOV complex cyst 1.5x2. X1.4 (no pelvic pain)  06/17/08 Labs:  TSH, CBC, PRL - all WNL.  Marland Kitchen      ROS  Objective:   Physical Exam  .Endometrial biopsy consent signed.  Exocervix cleansed with betadine solution x 3  Pipelle passed to 8cm without difficulty  Patient tolerated.  Assessment & Plan:     Encounter Diagnoses   Code Name Primary? Qualifier   . 626.2G Menometrorrhagia      Plan: ENDOMETRIAL BIOPSY, PATHOLOGY, INITIAL SURGICAL SPECIMEN         Specimen obtained and sent to pathology - call results.  Continue menses diary  Follow-up Pelvic USG 07/28/08.      Carman Ching, NP

## 2008-07-13 NOTE — Procedures (Addendum)
See progress note.

## 2008-07-14 LAB — HISTORICAL SURGICAL PATHOLOGY SPECIMEN

## 2008-07-15 ENCOUNTER — Telehealth (INDEPENDENT_AMBULATORY_CARE_PROVIDER_SITE_OTHER): Payer: Self-pay

## 2008-07-15 NOTE — Telephone Encounter (Signed)
Reviewed endometrial bx:  Secretory phase; no hyperplasia or atypia.    Plan:  Monitor menses pattern; no meds intervention            F/U USG for cyst.    Carman Ching, NP

## 2008-07-20 ENCOUNTER — Telehealth (INDEPENDENT_AMBULATORY_CARE_PROVIDER_SITE_OTHER): Payer: Self-pay

## 2008-07-20 ENCOUNTER — Ambulatory Visit (INDEPENDENT_AMBULATORY_CARE_PROVIDER_SITE_OTHER): Payer: No Typology Code available for payment source

## 2008-07-20 NOTE — Telephone Encounter (Signed)
Message says that she started her menses early and needed to R/S pelvic USG to 07/25/08.  Message did not tell me LMP.    Plan:  Called work # and CP# - left message on CP# that pelvic USG should be menstrual cycle day 7-10.  If this is correct, keep appt.    Call if questions at [facility] Office on Thursday.    Carman Ching, NP

## 2008-07-21 ENCOUNTER — Telehealth (HOSPITAL_BASED_OUTPATIENT_CLINIC_OR_DEPARTMENT_OTHER): Payer: Self-pay

## 2008-07-21 NOTE — Telephone Encounter (Signed)
Had to cancel F/U Pelvic USG for left small left complex cyst appt since Oct. 5 would be MC day 11.  Will wait until next menses and  R/S pelvic USG. Called patient to confirm I agree with plan.    Carman Ching, NP

## 2008-07-25 ENCOUNTER — Other Ambulatory Visit (INDEPENDENT_AMBULATORY_CARE_PROVIDER_SITE_OTHER): Payer: No Typology Code available for payment source

## 2008-07-28 ENCOUNTER — Other Ambulatory Visit (INDEPENDENT_AMBULATORY_CARE_PROVIDER_SITE_OTHER): Payer: No Typology Code available for payment source

## 2008-10-03 ENCOUNTER — Ambulatory Visit (INDEPENDENT_AMBULATORY_CARE_PROVIDER_SITE_OTHER): Payer: No Typology Code available for payment source | Admitting: GENERAL SURGERY

## 2008-10-05 ENCOUNTER — Encounter (INDEPENDENT_AMBULATORY_CARE_PROVIDER_SITE_OTHER): Payer: Self-pay

## 2008-10-05 ENCOUNTER — Ambulatory Visit (INDEPENDENT_AMBULATORY_CARE_PROVIDER_SITE_OTHER): Payer: No Typology Code available for payment source

## 2008-10-05 ENCOUNTER — Ambulatory Visit
Admission: RE | Admit: 2008-10-05 | Discharge: 2008-10-05 | Disposition: A | Discharge status: Home / Self Care | Payer: No Typology Code available for payment source

## 2008-10-05 VITALS — BP 110/70 | Ht 67.5 in | Wt 166.4 lb

## 2008-10-05 DIAGNOSIS — N6019 Diffuse cystic mastopathy of unspecified breast: Secondary | ICD-10-CM

## 2008-10-05 DIAGNOSIS — Z01419 Encounter for gynecological examination (general) (routine) without abnormal findings: Secondary | ICD-10-CM

## 2008-10-05 DIAGNOSIS — Z1231 Encounter for screening mammogram for malignant neoplasm of breast: Secondary | ICD-10-CM | POA: Insufficient documentation

## 2008-10-05 DIAGNOSIS — N63 Unspecified lump in unspecified breast: Secondary | ICD-10-CM | POA: Insufficient documentation

## 2008-10-05 DIAGNOSIS — N905 Atrophy of vulva: Secondary | ICD-10-CM

## 2008-10-05 MED ORDER — ESTRADIOL 0.01% (0.1 MG/GRAM) VAGINAL CREAM
2.00 g | TOPICAL_CREAM | VAGINAL | Status: DC
Start: 2008-10-05 — End: 2012-11-17

## 2008-10-05 NOTE — Progress Notes (Signed)
Subjective:     Patient ID:  Tracie [name] is an 42 y.o. female   Chief Complaint:  Chief Complaint   Patient presents with   . 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  IMB: none; PCB none  +vaginal dryness - used KYLiquibeads helped  Contraception: Vasectomy  Condoms: none    Other concerns: none  Refill request: none    Health Maintenance Labs: 2008 normal  Exercise: Stationary Bike daily  Calcium with Vit. D.: 1-2/day plus MVI  Caffeine intake: none    Occupation: Geologist, engineering     Review of Systems   All other systems reviewed and are negative.      Objective:   Physical Exam   Constitutional: She appears well-developed and well-nourished.   Pulm:  Effort normal.    Breast exam:    Right normal breast;  with no masses, skin changes or nipple discharge.  Left normal breast;  with no masses, skin changes or nipple discharge.  Left breast asymmetry.  There are fibrocystic changes (2 cysts 3.5cm, 2.0cm) to the right breast.  There are fibrocystic changes (3 cysts 3.0cm, 4.0cm) to the left breast.  No mass within the left breast.  No mass within the right breast.  No tenderness to right breast.  No tenderness to left breast.  No right axillary lymph nodes are palpable.  No left axillary lymph nodes are palpable.    Abdomen:   Soft.   GU:    External Exam:    External exam normal (mild- moderate atrophy).    Urethral hypermobility present.  No genital lesions present.      Vaginal Exam:     Vagina normal (+ menses) to exam.  Cystocele present.  Cystocele stage +2.  Rectocele present.  Rectocele stage +1.  Positive for vaginal prolapse (grade 1-2).  No vaginal discharge present.  No vaginal odor present.  Cervix is normal to exam.      Bladder:    Bladder is normal to exam.      Uterus:   Uterus is normal to exam.     Position:  Anteverted.        Adenexal findings:    No masses and no tenderness.        Rectal Exam:   Rectum normal to exam.     Psychiatric: She has a normal mood and affect. Her behavior is normal. Judgment and thought content normal.     .  Filed Vitals:    10/05/2008  1:58 PM   BP: 110/70   Height: 1.715 m (5' 7.5")   Weight: 75.479 kg (166 lb 6.4 oz)         Assessment & Plan:     Pap not indicated; next Pap 2011  SBE,CBE, Mammogram 2010  Start Estrace Cream 2.0 grams BIW RX  Kegel's ~50/day  Add Ca+ and MG+ with Vit D daily  Continue Menses diary        Carman Ching, NP

## 2009-01-03 ENCOUNTER — Ambulatory Visit (INDEPENDENT_AMBULATORY_CARE_PROVIDER_SITE_OTHER): Payer: No Typology Code available for payment source | Admitting: Urology

## 2009-01-03 DIAGNOSIS — R809 Proteinuria, unspecified: Secondary | ICD-10-CM

## 2009-01-03 NOTE — H&P (Signed)
 CC:  "Protein in my urine."    HPI:  43 yo WF referred for evaluation of "proteinuria".  Patient stated that she did a 24 hour urine at Dr. Sherida Dimmer office and it showed "abnormal protein".    On review of faxed notes, it stated that her 24 hour urine total protein was 91 (nl 50-100 mg/ 24 hrs.).     Case was discussed with patient. I did not intepret the faxed information to indicate that she had proteinuria. If she did have proteinuria, she would need to be evaluated by a nephrologist, not a urologist.  Patient perturbed by her visit here today.    PMHx:  "Irregular heartbeat"    PSHx:  D&C x 2    ALLERGIES:  TCN    CURRENT MEDICATIONS:   None    SHx:  Secretary, non-smoker, no ETOH    FHx:   HTN-father    Heart Dz- father   DM-GP    ROS:                        General/Constitutional-+Weight gain      Skin/Breast-+Fibrocystic dz       Eyes/Ears/Nose/Mouth/Throat-Neg.      Cardiovascular-Neg.      Respiratory -Neg.                        Gastrointestinal-+Abdominal bloating , +chronic abdominal pains                        Genitourinary -See HPI            Ob-Gyn- Neg.             Hematologic -Neg.       Endocrine -Neg.                         Musculoskeletal -Neg.                         Neurologic-Neg.                         Psychiatric -Neg.                         Allergic/Immunologic -Neg.      OBJECTIVE:               Vital Signs-   BP             P              RR            T    vitals 01/03/2009   BP 110/66   Temp 99.4   Temp src 2   Pulse 60   Resp 20   SpO2    Height 68 in.   Wt - Scale 174 lb   BODY MASS INDEX 26.85                           General- WDWN lady  in NAD                         HEENT- Grossly intact  Chest- Lungs CTA bilaterally                         Heart- RRR without murmurs or gallops noted                         Abdomen- Soft & NT                             Back- No CVAT                         GU- Deferred                         Rectal- Deferred               Extremities: Grossly intact                         Neurologic- Grossly intact    LABS:          U/A-    POCT RESULTS 01/03/2009   Time collected  3:48 PM   Glucose Negative   Bilirubin Negative   Ketones Negative   Specific Gravity 1.010   Blood Negative   pH 6.5   Protein Negative   Urobilinogen Normal    Nitrite Negative   Leukocytes Negative       ASSESSMENT:    Proteinuria by history-not substantiated by faxed 24 hr. Urine results and U/A today    PLAN:   1) Referral to nephrologist for true proteinuria.                2) RTC prn.    Amos Balint., MD, FACS  Assistant Professor   Division of Urology

## 2009-09-22 ENCOUNTER — Ambulatory Visit (INDEPENDENT_AMBULATORY_CARE_PROVIDER_SITE_OTHER): Payer: Self-pay

## 2009-09-22 NOTE — Telephone Encounter (Signed)
~   1 month of NS nightly; no HF, + vaginal dryness.  Menses: monthly/4-5 days/ moderate  TSH/choles Panel - labs to PCP    Plan:   Discussed Hormonal versus Non hormonal options.  Get Lipid + TSH labs results.  Annual 10/2009.    Carman Ching, NP

## 2009-09-22 NOTE — Telephone Encounter (Signed)
Triage Queue message copied by Carman Ching on Fri Sep 22, 2009 1:07 PM  ------   Message from: Milas Kocher   Created: Fri Sep 22, 2009 12:25 PM    >> Baldwin Crown Fri Sep 22, 2009 12:17 pm  This is a pt of Golden Pop- pt called in stating that she is having some menopausal symptoms and wanted to know if Pam could prescribe her something until she is seen on 1/12. Pt didn't say what type of symptoms she is having and asked that someone call her back at work [phone] until 5pm and after 5:30 at home [phone]. thanks

## 2009-11-01 ENCOUNTER — Ambulatory Visit (INDEPENDENT_AMBULATORY_CARE_PROVIDER_SITE_OTHER): Payer: Managed Care, Other (non HMO)

## 2009-11-01 ENCOUNTER — Ambulatory Visit (HOSPITAL_COMMUNITY): Payer: Managed Care, Other (non HMO)

## 2009-12-06 ENCOUNTER — Ambulatory Visit (INDEPENDENT_AMBULATORY_CARE_PROVIDER_SITE_OTHER): Payer: Managed Care, Other (non HMO)

## 2009-12-06 ENCOUNTER — Ambulatory Visit (HOSPITAL_COMMUNITY): Payer: Managed Care, Other (non HMO)

## 2010-01-03 ENCOUNTER — Ambulatory Visit
Admission: RE | Admit: 2010-01-03 | Discharge: 2010-01-03 | Disposition: A | Discharge status: Home / Self Care | Payer: Managed Care, Other (non HMO)

## 2010-01-03 ENCOUNTER — Other Ambulatory Visit (INDEPENDENT_AMBULATORY_CARE_PROVIDER_SITE_OTHER)
Admission: RE | Admit: 2010-01-03 | Discharge: 2010-01-03 | Disposition: A | Discharge status: Home / Self Care | Payer: Managed Care, Other (non HMO) | Source: Ambulatory Visit

## 2010-01-03 ENCOUNTER — Ambulatory Visit (INDEPENDENT_AMBULATORY_CARE_PROVIDER_SITE_OTHER): Payer: Managed Care, Other (non HMO)

## 2010-01-03 ENCOUNTER — Encounter (INDEPENDENT_AMBULATORY_CARE_PROVIDER_SITE_OTHER): Payer: Self-pay

## 2010-01-03 ENCOUNTER — Other Ambulatory Visit (INDEPENDENT_AMBULATORY_CARE_PROVIDER_SITE_OTHER): Payer: Self-pay

## 2010-01-03 ENCOUNTER — Ambulatory Visit (HOSPITAL_COMMUNITY): Payer: Managed Care, Other (non HMO)

## 2010-01-03 VITALS — BP 130/80 | Ht 67.5 in | Wt 157.0 lb

## 2010-01-03 DIAGNOSIS — Z1231 Encounter for screening mammogram for malignant neoplasm of breast: Secondary | ICD-10-CM | POA: Insufficient documentation

## 2010-01-03 DIAGNOSIS — Z01419 Encounter for gynecological examination (general) (routine) without abnormal findings: Secondary | ICD-10-CM

## 2010-01-03 NOTE — Progress Notes (Addendum)
Subjective:     Patient ID:  Tracie [name] is an 44 y.o. female   Chief Complaint:  Chief Complaint   Patient presents with   . Gyn Exam     Annual Exam          HPI    Partner status:  married  Last Pap:   09/28/07 Negative; random check - HR negative.  Abnormal Pap:  Yes  2001 LSIL  2001 Colpo, CIN 1.    Last Mammogram: 10/05/08 negative  + hx of breast cysts needing aspiration.  Last bilateral breast USG - 09/28/07 simple cysts 2.9 -3.2 cm.    Contraception: vasectomy  Menses: mostly monthly/3-4 days/moderate+  2009 menometrorrhagia with EMBX - secretory; no malignancy.  Vasomotor Symptoms:  HF - none; NS - rare; VD - some >uses Estrace Cream PRN.  IMB:  none  PCB:  none  Smoking:  None  UI:  None.    Exercise:  Biking x3/week (decreased due to weather)  Calcium:  Foods:  X2 ser/day; MVI daily   Caffeine:  none  Vaccines:  Influenza 2010 - yes.    GYN Concerns:  none    Occupation:  Geologist, engineering at [employer].    Review of Systems   Constitutional: Negative.    Skin: Negative.    HENT: Negative.    Eyes: Negative.    Cardiovascular: Negative.    Respiratory: Positive for cough.         URI x 10 days with dose pak RX.   Gastrointestinal: Negative.    Genitourinary: Negative.    Musculoskeletal: Negative.    Endo/Heme/Allergies: Negative.    Neurological: Negative.    Psychiatric: Negative.        Objective:   Physical Exam   Constitutional: She is oriented. She appears well-developed and well-nourished.   Pulm:  Effort normal.    Breast exam:     Right normal breast;  with no masses, skin changes or nipple discharge.  Left normal breast;  with no masses, skin changes or nipple discharge.  There are fibrocystic changes (multiple cysts - WNL) to the right breast.  There are fibrocystic changes (multiple cysts - WNL) to the left breast.  No mass within the left breast.  No mass within the right breast.  No tenderness to right breast.  No tenderness to left breast.  No right axillary lymph nodes are palpable.  No left axillary lymph nodes are palpable.    Abdomen:   Soft.   GU:    External Exam:    External exam normal.    No genital lesions present.      Vaginal Exam:    Vagina normal (+ brown menses noted) to exam.  Cystocele present.  Cystocele stage +2 (2+ grade; no leak).  Rectocele present.  Rectocele stage +1.  No vaginal prolapse.  No vaginal discharge present.  No vaginal odor present.  Cervix is normal to exam.      Bladder:    Bladder is normal to exam.      Uterus:      Position:  Anteverted.    Contour:  Regular.    Mobility:  Mobile.          Adenexal findings:    No masses and no tenderness.        Rectal Exam:   Rectum normal to exam.   No external hemorrhoid(s) present.  Kegel 1/4  Neurological: She is alert and oriented.   Skin: Skin  is warm and dry.   Psychiatric: She has a normal mood and affect. Her behavior is normal. Judgment and thought content normal.     .BP 130/80   Ht 1.715 m (5' 7.5")   Wt 71.215 kg (157 lb)   LMP 12/27/2009  Body mass index is 24.23 kg/(m^2).      Assessment & Plan:     Pap - no HR  BSE, CBE, Mammogram 2012  Work on [employer] ~50/day  Discussed need to control cough - see PCP.  Call for Estrace Cream refill.  RTN 1 year.    Carman Ching, NP    I reviewed the note and agree with the findings/plan of care as documented. Any exceptions/additions are edited/noted.    Sophia Bennett Scrape, MD 01/04/2010, 11:04 AM

## 2010-01-05 LAB — HISTORICAL CYTOPATHOLOGY-GYN (PAP AND HPV TESTS)

## 2010-11-01 ENCOUNTER — Ambulatory Visit (HOSPITAL_BASED_OUTPATIENT_CLINIC_OR_DEPARTMENT_OTHER): Payer: Self-pay

## 2010-11-01 DIAGNOSIS — N6019 Diffuse cystic mastopathy of unspecified breast: Secondary | ICD-10-CM

## 2010-11-01 NOTE — Telephone Encounter (Signed)
Ordered Bilateral Diagnostic Mammogram and right breast USG for breast cyst (Hx of breast cysts that require aspiration/drainage if large).    Marcelino Duster, Please call her and tell her that I ordered the Diagnostic Mammogram/right breast USG - she needs to call/schedule and she may need to schedule appt with Dr. Dorcas Carrow as well if if needs drained.    Carman Ching, NP 11/01/2010, 1:06 PM

## 2010-11-01 NOTE — Telephone Encounter (Signed)
Message copied by Carman Ching on Thu Nov 01, 2010  1:04 PM  ------       Message from: Milas Kocher       Created: Thu Nov 01, 2010  8:38 AM         >> Holly Bodily 11/01/2010 08:34 AM       [facility] pt         The patient stated that she has her mammogram scheduled for March.  She stated that she has a problem with cysts in her breasts but now she has a cyst in her right breast that is more prominent.   The patient asked if Pam would want her to have her mammogram sooner.   Please advise the patient at work at [phone].    Thank you

## 2010-11-13 ENCOUNTER — Ambulatory Visit (HOSPITAL_BASED_OUTPATIENT_CLINIC_OR_DEPARTMENT_OTHER)
Admission: RE | Admit: 2010-11-13 | Discharge: 2010-11-13 | Disposition: A | Discharge status: Home / Self Care | Payer: Managed Care, Other (non HMO) | Source: Ambulatory Visit

## 2010-11-13 ENCOUNTER — Ambulatory Visit
Admission: RE | Admit: 2010-11-13 | Discharge: 2010-11-13 | Disposition: A | Discharge status: Home / Self Care | Payer: Managed Care, Other (non HMO) | Source: Ambulatory Visit

## 2010-11-13 DIAGNOSIS — N6009 Solitary cyst of unspecified breast: Secondary | ICD-10-CM

## 2010-11-13 DIAGNOSIS — N63 Unspecified lump in unspecified breast: Secondary | ICD-10-CM

## 2010-11-16 ENCOUNTER — Telehealth (INDEPENDENT_AMBULATORY_CARE_PROVIDER_SITE_OTHER): Payer: Self-pay

## 2010-11-16 DIAGNOSIS — N6001 Solitary cyst of right breast: Secondary | ICD-10-CM

## 2010-11-16 NOTE — Telephone Encounter (Signed)
LVM message that 11/13/2010 Diagnostic Mammogram + right Breast USG:  Ordered these tests in response to Telephone Encounter message of 11/01/2010:  She stated that she has a problem with cysts in her breasts but now she has a cyst in her right breast that is more prominent.    Mammogram IMPRESSION:   Finding 1: Masses in both breasts are benign in appearance.   Finding 2: Palpable abnormality in the right breast requires additional evaluation. An ultrasound exam is recommended.   BI-RADS Category 0: Incomplete: Needs Additional Evaluation    RIGHT BREAST ULTRASOUND FINDINGS   There is a complicated cyst measuring 15 x 23 x 20 mm seen in the right breast at 10 o'clock. Finding correlates to the palpable abnormality in the right breast at 10 o'clock.   IMPRESSION:   Complicated cyst in the right breast is probably benign. Follow-up ultrasound in 6 months is recommended.   BI-RADS Category 3: Probably Benign     Plan:   LVM message:  Tests ordered in anticipation that she may need to see Dr. Dorcas Carrow for draining of right breast cyst but no appt.  Repeat Right Breast USG ordered ~ 05/14/2011.  Call if questions.    Carman Ching, NP 11/16/2010, 1:05 PM

## 2010-11-29 ENCOUNTER — Ambulatory Visit: Payer: Managed Care, Other (non HMO) | Attending: Otolaryngology | Admitting: Otolaryngology

## 2010-11-29 ENCOUNTER — Encounter (INDEPENDENT_AMBULATORY_CARE_PROVIDER_SITE_OTHER): Payer: Self-pay | Admitting: Otolaryngology

## 2010-11-29 VITALS — BP 125/53 | HR 77 | Temp 98.1°F | Ht 67.5 in | Wt 151.0 lb

## 2010-11-29 DIAGNOSIS — M3500 Sicca syndrome, unspecified: Secondary | ICD-10-CM | POA: Insufficient documentation

## 2010-11-29 DIAGNOSIS — M26609 Unspecified temporomandibular joint disorder, unspecified side: Secondary | ICD-10-CM | POA: Insufficient documentation

## 2010-11-29 DIAGNOSIS — M2669 Other specified disorders of temporomandibular joint: Secondary | ICD-10-CM

## 2010-11-29 MED ORDER — SORBITOL 0.3 GRAM-SALIVA STIMULANT NO1-MALIC ACID-CALCIUM PHOS LOZENGE
1.00 | LOZENGE | Freq: Three times a day (TID) | Status: DC | PRN
Start: 2010-11-29 — End: 2011-09-11

## 2010-11-29 MED ORDER — DOXEPIN 10 MG CAPSULE
10.00 mg | ORAL_CAPSULE | Freq: Every evening | ORAL | Status: DC
Start: 2010-11-29 — End: 2012-11-17

## 2010-11-30 NOTE — Progress Notes (Signed)
I saw and evaluated the patient. I reviewed the resident's note. I agree with the findings and plan of care as documented in the resident's note. Any exceptions/additions are noted.      Alford Highland, MD 11/30/2010, 7:25 AM

## 2010-11-30 NOTE — H&P (Signed)
PATIENT NAME:  Tracie [name]  MRN:  [id number]  DOB:  07/28/1966  DATE OF SERVICE: 11/29/2010    Chief Complaint:  Barotitis Media      HPI:  Tracie [name] is a 45 y.o. female who presents to our clinic for evaluation of left aural pressure for the past 6 months.  She has been told that she has fluid in the left middle ear and was treated with antibiotics without relief of symptoms.  She describes her hearing as muffled.  She denies any fluctuation of hearing or dizziness.  She has been told by her dentist that she has temporomandibular joint problems and has been given an oral splint to use, however, she also has a history of Sjogren's disease and the dry mouth is exacerbated with the splint.  She reportedly saw Dr. Ramadan at [facility] over a year ago with similar problems.  An audiogram was performed at the time which showed symmetrical high frequency SNHL.      Past Medical History:  Past Medical History   Diagnosis Date   . Chest pain 01/28/2008   . Abnormal Pap smear      2001 LSIL   . Unspecified breast disorder      large breast cysts   . Dysplasia of cervix      2001 colpo CIN 1         Past Surgical History:  Past Surgical History   Procedure Date   . Hx tonsillectomy    . Biopsy breast      breast aspirations x 2; last 2008         Family History:  Family History   Problem Relation Age of Onset   . Heart Disease Paternal Grandfather    . Hypertension Paternal Grandfather    . Heart Disease Father    . Hypertension Father    . Uterine Fibroids Mother    . Diabetes Paternal Grandmother    . Healthy Neg Hx    . Anesth Problems Neg Hx    . Bleeding Prob Neg Hx    . Cancer Neg Hx    . Other Neg Hx          Social History:  History   Smoking status   . Former Smoker -- 0.2 packs/day for 2 years   . Quit date: 10/22/1991   Smokeless tobacco   . Never Used       History   Alcohol Use No       Social History   Occupational History   . 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   .            Medications:  Outpatient prescriptions marked as taking for the 11/29/10 encounter (Office Visit) with Alford Highland   Medication Sig   . doxepin (SINEQUAN) 10 mg Oral Capsule take 1 Cap by mouth every night.   . Sorbitol-Sal St#1-Mal Ac-Ca Ph (NUMOISYN) 0.3 g Mucous Membrane Lozenge 1 Lozenge by Mucous Membrane route Three times a day as needed.   . Amiloride-Hydrochlorothiazide 5-50 mg Tab take by mouth. 1 daily   . Potassium 99 mg Tab take by mouth. 2 daily   . naproxen (NAPROSYN) 500 mg Tab take by mouth. As needed   . Pilocarpine HCl 7.5 mg Tab take by mouth. Sustitition for [facility] 7.5mg   Take one tab by mouth three times daily   . Multivitamin Cap    . Flecainide (TAMBOCOR) 50  mg Tab take 50 mg by mouth Twice daily.    . Zinc 50 mg Tab take by mouth.    . SUPER B COMPLEX PO take by mouth.          Allergies:  Allergies   Allergen Reactions   . Tetracycline          Review of Systems:  Do you have any fevers: no   Any weight change: no   Change in your vision: no    Chest Pain: no   Shortness of Breath: no   Stomach pain: no   Urinary difficulity: no   Joint Pain: no   Skin Problems: no   Weakness or Numbness: no   Easy Bruising or Bleeding: no   Excessive Thirst: yes Explain Excessive Thirst: pt states "I have Jsogrens". Seasonal Allergies: no    All other systems reviewed and found to be negative.    Physical Exam:  Blood pressure 125/53, pulse 77, temperature 36.7 C (98.1 F), height 1.715 m (5' 7.5"), weight 68.493 kg (151 lb).  Body mass index is 23.30 kg/(m^2).  General Appearance: Pleasant, cooperative, healthy, and in no acute distress.  Eyes: Conjunctivae/corneas clear, PERRLA, EOM's intact.  Head and Face: Normocephalic, atraumatic.  Face symmetric, no obvious lesions.   Pinnae: Normal shape and position.   External auditory canals:  Patent without inflammation.  Tympanic membranes:  Intact, translucent, midposition, middle ear aerated.   Nose:  External pyramid midline. Septum midline. Mucosa normal. No purulence, polyps, or crusts.   Oral Cavity/Oropharynx: No mucosal lesions, masses, or pharyngeal asymmetry.  Dry mucous membranes.  There is crepitus at the temporomandibular joint.  Pterygoid and SCM musculature are tender to palpation.    Tonsils: s/p tonsillectomy  Hypopharynx/Larynx: Indirect mirror laryngoscopy revealed no hypopharyngeal or laryngeal masses or lesions, normal laryngeal mobility, and voice normal.  Neck:  No palpable thyroid, salivary gland, or neck masses.  Heme/Lymph:  No cervical adenopathy.  Cardiovascular:  Good perfusion of upper extremities.  No cyanosis of the hands or fingers.  Lungs: No apparent stridorous breathing. No acute distress.  Skin: Skin warm and dry.  Neurologic: Cranial nerves:  grossly intact.  Psychiatric:  Alert and oriented x 3.    Data Reviewed:  Audiogram 02/28/09 at [facility] - Normal hearing, AU, up to [facility] then slight SNHL, AU - symmetric.  Type A tympanograms.    Assessment:  Costen's syndrome  Sjogren's disease      Plan:  Orders Placed This Encounter   . doxepin (SINEQUAN) 10 mg Oral Capsule   . 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  We will prescribe a course of Doxepin to help with sleep.  We will also prescribe Numoisyn to help with her dry mouth.  Return in 3 months.    Remo Lipps, MD  Resident  [address] Department of Otolaryngology    Alford Highland, MD

## 2010-12-05 ENCOUNTER — Ambulatory Visit (INDEPENDENT_AMBULATORY_CARE_PROVIDER_SITE_OTHER): Payer: Self-pay | Admitting: Otolaryngology

## 2010-12-05 NOTE — Telephone Encounter (Signed)
Message copied by Lauro Regulus on Wed Dec 05, 2010  9:20 AM  ------       Message from: [employer]       Created: Wed Dec 05, 2010  9:15 AM         >> [employer]key [employer] 12/05/2010 09:15 AM       Tracie [name] pt              Patient was prescribed doxepin (SINEQUAN) 10 mg Oral Capsule.  Patient is very upset.  She was told that this med was a muscle relaxer and when she picked it up it states that it is an antidepressant.  She IS NOT going to take this medication.  Please call her at work.  You can leave a message if needed.  (her cell is [phone])              [facility] - [place], [place] - [address] AT [address]       Phone: [phone] Fax: [phone]

## 2010-12-05 NOTE — Telephone Encounter (Signed)
Called patients cell left message explaining that messages work in different ways. He prescribed it to help her  sleep. Not as an antidepressant.  She would have to take 15 or 20 times that does to make it an antidepressant.

## 2011-01-09 ENCOUNTER — Ambulatory Visit (HOSPITAL_COMMUNITY): Payer: Managed Care, Other (non HMO)

## 2011-01-09 ENCOUNTER — Ambulatory Visit (INDEPENDENT_AMBULATORY_CARE_PROVIDER_SITE_OTHER): Payer: Managed Care, Other (non HMO)

## 2011-01-15 ENCOUNTER — Ambulatory Visit (INDEPENDENT_AMBULATORY_CARE_PROVIDER_SITE_OTHER): Payer: Managed Care, Other (non HMO)

## 2011-02-14 ENCOUNTER — Encounter (INDEPENDENT_AMBULATORY_CARE_PROVIDER_SITE_OTHER): Payer: Managed Care, Other (non HMO) | Admitting: Otolaryngology

## 2011-03-04 ENCOUNTER — Ambulatory Visit (INDEPENDENT_AMBULATORY_CARE_PROVIDER_SITE_OTHER): Payer: Managed Care, Other (non HMO) | Admitting: Otolaryngology

## 2011-03-22 ENCOUNTER — Ambulatory Visit (INDEPENDENT_AMBULATORY_CARE_PROVIDER_SITE_OTHER): Payer: Managed Care, Other (non HMO)

## 2011-03-22 ENCOUNTER — Ambulatory Visit (INDEPENDENT_AMBULATORY_CARE_PROVIDER_SITE_OTHER): Payer: Self-pay

## 2011-03-22 DIAGNOSIS — Z1231 Encounter for screening mammogram for malignant neoplasm of breast: Secondary | ICD-10-CM

## 2011-03-22 NOTE — Telephone Encounter (Signed)
Message copied by Milas Kocher on Fri Mar 22, 2011 11:44 AM  ------       Message from: Milas Kocher       Created: Fri Mar 22, 2011 11:23 AM         >> Julaine Fusi 03/22/2011 10:36 AM       If she can't get her in after July 24th, she will keep the appt in June but she prefers to do it on the same day so she doesn't have to miss more work.                      >> [employer] 03/22/2011 10:35 AM       Toni Amend pt              Pt is calling because she is due for another mamo in July.  She needs Pam to put in order in but she also needs to know if it is possible for her to change her June appt to after July 24th so she can see Pam the same day as her mammo.  I don't have any appts with Pam until October.  Please advise.               Thank you.

## 2011-03-22 NOTE — Telephone Encounter (Signed)
Appointment scheduled for July 25.  Patient informed.

## 2011-03-22 NOTE — Telephone Encounter (Signed)
Chart reviewed:  Last Mammogram ordered 01/03/2010 > expires 04/06/2011.    Plan:  Marcelino Duster, I ordered new Mammogram order and you helped her change her appt to 05/15/2011.  Please call her and let her know I put new order in [place].    Carman Ching, NP 03/22/2011, 6:17 PM

## 2011-03-22 NOTE — Telephone Encounter (Signed)
Message copied by Carman Ching on Fri Mar 22, 2011  6:04 PM  ------       Message from: Milas Kocher       Created: Fri Mar 22, 2011 10:47 AM         >> Julaine Fusi 03/22/2011 10:36 AM       If she can't get her in after July 24th, she will keep the appt in June but she prefers to do it on the same day so she doesn't have to miss more work.                      >> [employer] 03/22/2011 10:35 AM       Toni Amend pt              Pt is calling because she is due for another mamo in July.  She needs Pam to put in order in but she also needs to know if it is possible for her to change her June appt to after July 24th so she can see Pam the same day as her mammo.  I don't have any appts with Pam until October.  Please advise.               Thank you.

## 2011-04-10 ENCOUNTER — Ambulatory Visit (INDEPENDENT_AMBULATORY_CARE_PROVIDER_SITE_OTHER): Payer: Managed Care, Other (non HMO)

## 2011-05-15 ENCOUNTER — Ambulatory Visit (INDEPENDENT_AMBULATORY_CARE_PROVIDER_SITE_OTHER): Payer: Managed Care, Other (non HMO)

## 2011-05-15 ENCOUNTER — Ambulatory Visit (HOSPITAL_COMMUNITY): Payer: Managed Care, Other (non HMO)

## 2011-08-30 ENCOUNTER — Encounter (FREE_STANDING_LABORATORY_FACILITY)
Admit: 2011-08-30 | Discharge: 2011-08-30 | Disposition: A | Discharge status: Home / Self Care | Payer: Managed Care, Other (non HMO) | Attending: Family Medicine | Admitting: Family Medicine

## 2011-08-31 LAB — THYROID STIMULATING HORMONE (SENSITIVE TSH): TSH: 2.697 u[IU]/mL (ref 0.300–5.900)

## 2011-08-31 LAB — THYROXINE, FREE (FREE T4): THYROXINE, FREE (FREE T4): 0.97 ng/dL (ref 0.60–1.10)

## 2011-09-11 ENCOUNTER — Encounter (INDEPENDENT_AMBULATORY_CARE_PROVIDER_SITE_OTHER): Payer: Self-pay

## 2011-09-11 ENCOUNTER — Ambulatory Visit (HOSPITAL_BASED_OUTPATIENT_CLINIC_OR_DEPARTMENT_OTHER)
Admission: RE | Admit: 2011-09-11 | Discharge: 2011-09-11 | Disposition: A | Discharge status: Home / Self Care | Payer: Managed Care, Other (non HMO) | Source: Ambulatory Visit

## 2011-09-11 ENCOUNTER — Ambulatory Visit (HOSPITAL_BASED_OUTPATIENT_CLINIC_OR_DEPARTMENT_OTHER): Payer: Managed Care, Other (non HMO)

## 2011-09-11 ENCOUNTER — Ambulatory Visit
Admission: RE | Admit: 2011-09-11 | Discharge: 2011-09-11 | Disposition: A | Discharge status: Home / Self Care | Payer: Managed Care, Other (non HMO) | Source: Ambulatory Visit

## 2011-09-11 VITALS — BP 120/68 | Ht 67.5 in | Wt 148.4 lb

## 2011-09-11 DIAGNOSIS — R6889 Other general symptoms and signs: Secondary | ICD-10-CM | POA: Insufficient documentation

## 2011-09-11 DIAGNOSIS — N6009 Solitary cyst of unspecified breast: Secondary | ICD-10-CM | POA: Insufficient documentation

## 2011-09-11 DIAGNOSIS — Z01419 Encounter for gynecological examination (general) (routine) without abnormal findings: Secondary | ICD-10-CM | POA: Insufficient documentation

## 2011-09-11 DIAGNOSIS — Z87891 Personal history of nicotine dependence: Secondary | ICD-10-CM | POA: Insufficient documentation

## 2011-09-11 DIAGNOSIS — N8111 Cystocele, midline: Secondary | ICD-10-CM | POA: Insufficient documentation

## 2011-09-11 DIAGNOSIS — N6019 Diffuse cystic mastopathy of unspecified breast: Secondary | ICD-10-CM | POA: Insufficient documentation

## 2011-09-11 DIAGNOSIS — Z1231 Encounter for screening mammogram for malignant neoplasm of breast: Secondary | ICD-10-CM | POA: Insufficient documentation

## 2011-09-11 NOTE — Progress Notes (Addendum)
Subjective:     Patient ID:  Tracie [name] is an 45 y.o. female   Chief Complaint:    Chief Complaint   Patient presents with   . Gyn Exam     Annual       HPI  Return Patient    Partner status: married   Last Pap: 01/03/2010 Negative; random check - 12/08 HR negative.   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   Last Mammogram: Diagnostic bilateral - 11/13/2010 Multiple masses in both breasts; right breast USG - complicated cyst.   + hx of breast cysts needing aspiration.   Last bilateral breast USG - 09/28/07 simple cysts 2.9 -3.2 cm.     Contraception: vasectomy   Menses: mostly monthly/3-4 days/moderate+ flow; had x 2 menses in 02/2011 .  2009 menometrorrhagia with EMBX - secretory; no malignancy/hyperplasia.   Vasomotor Symptoms: HF - none; NS - none; VD - some >used Estrace Cream in past  IMB: none PCB: none   Smoking: None   UI: None.     HML:  06/2011 Lipids/Glu - WNL; TSH - elevated with repeat TSH 2 mos -WNL  Exercise: none  Calcium: Foods: X2 ser/day; MVI daily; Vit  D 1000 IU /day   Caffeine: none   Vaccines: UTD, Influenza 2012 - yes.     GYN Concerns: Constipation hx -recently used Miralax/Mineral oil.     Occupation: Geologist, engineering at [employer].    History     Social History   . Marital Status: Married     Spouse Name: Kendell Bane     Number of Children: 3   . Years of Education: 12     Occupational History   . 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   .       Social History Main Topics   . Smoking status: Former Smoker -- 0.2 packs/day for 2 years     Quit date: 10/22/1991   . Smokeless tobacco: Never Used   . Alcohol Use: No   . Drug Use: No   . Sexually Active: Yes -- Female partner(s)     Birth Control/ Protection: Vasectomy     Other Topics Concern   . Abuse/Domestic Violence No   . Breast Self Exam Yes   . Caffeine Concern No   . Calcium Intake Adequate No   . Computer Use Yes   . Exercise Concern No   . Helmet Use Yes   . Seat Belt Yes   . Special Diet Yes     low sodium diet   . Sunscreen Used No    . Uses Gait Assitive Device (Cane, Walker, Etc) No   . Right Hand Dominant Yes   . Left Hand Dominant No   . Ambidextrous No     Social History Narrative   . No narrative on file     Past Medical History   Diagnosis Date   . Chest pain 01/28/2008     work-up negative   . Abnormal Pap smear 2001     LSIL   . Unspecified breast disorder chronic     large breast cysts   . Dysplasia of cervix 2001     Colpo CIN 1     Past Surgical History   Procedure Date   . Hx tonsillectomy 1974   . Biopsy breast      breast aspirations x 2; last 2008  Family History   Problem Relation Age of Onset   . Heart Disease Paternal Grandfather    . Hypertension Paternal Grandfather    . Heart Disease Father    . Hypertension Father    . Uterine Fibroids Mother    . Diabetes Paternal Grandmother    . Healthy Neg Hx    . Anesth Problems Neg Hx    . Bleeding Prob Neg Hx    . Cancer Neg Hx    . Other Neg Hx        Outpatient Prescriptions Prior to Visit:  Amiloride-Hydrochlorothiazide 5-50 mg Tab take by mouth. 1 daily   Potassium 99 mg Tab take by mouth. 2 daily   naproxen (NAPROSYN) 500 mg Tab take by mouth. As needed   Pilocarpine HCl 7.5 mg Tab take by mouth. Sustitition for [facility] 7.5mg   Take one tab by mouth three times daily   Multivitamin Cap    Flecainide (TAMBOCOR) 50 mg Tab take 50 mg by mouth Twice daily.    Zinc 50 mg Tab take by mouth.    SUPER B COMPLEX PO take by mouth.    doxepin (SINEQUAN) 10 mg Oral Capsule take 1 Cap by mouth every night.   Sorbitol-Sal St#1-Mal Ac-Ca Ph (NUMOISYN) 0.3 g Mucous Membrane Lozenge 1 Lozenge by Mucous Membrane route Three times a day as needed.   Estradiol 0.01 % (0.1 mg/g) Crea 2 g by Vaginal route. 2.0 grams externally twice weekly      Allergies   Allergen Reactions   . Tetracycline      Review of Systems   All other systems reviewed and are negative.        Objective:   Physical Exam    Constitutional: She is oriented to person, place, and time. She appears well-developed and well-nourished. No distress.   Pulm:  Effort normal.    Breast exam:    Right normal breast;  with no masses, skin changes or nipple discharge.  Left normal breast;  with no masses, skin changes or nipple discharge.  Right breast asymmetry.  There are fibrocystic changes to the right breast.  There are fibrocystic changes to the left breast.  No tenderness to right breast.  No tenderness to left breast.  No right axillary lymph nodes are palpable.  No left axillary lymph nodes are palpable.  Left breast mass (1) is 3 cm cm., located at 10 O'Clock (10 -12 o'clock), irregular in shape, mobile, soft in consistency .  Left breast mass (2) is 2 cm  cm., located at 2 O'Clock (2 - 4 o'clock), irregular in shape, mobile, soft in consistency.  Right breast mass (1) is 3 cm cm., located at 8 O'Clock (8 - 12 ), irregular in shape, mobile, soft in consistency .  Right breast mass (2) is 1 cm  cm., located at tail, irregular in shape, fixed, soft in consistency .  Left - 1 cm - tail - irregular - soft - mobile. Left breast mass (3) is 1 cm cm., located at 7 O'Clock, irregular in shape, mobile, soft in consistency.    Abdomen:   Soft.   GU:    External Exam:    External exam normal (gaping introitus).    No genital lesions present.      Vaginal Exam:    Vagina normal to exam.  Cystocele present.  Cystocele stage +2.  Rectocele present.  Rectocele stage +1.  No vaginal prolapse.  No vaginal discharge present.  No vaginal  odor present.  Cervix is normal (multiple nabothian cysts. ) to exam.      Bladder:    Bladder is normal to exam.      Uterus:   Uterus is normal to exam.     Position:  Anteverted.    Contour:  Regular.    Mobility:  Mobile.            Adenexal findings:    No tenderness.          Rectal Exam:   Rectum normal to exam.   No external hemorrhoid(s) present.  Kegel 0-1/4   Neurological: She is alert and oriented to person, place, and time.   Skin: Skin is warm and dry.   Psychiatric: She has a normal mood and affect. Her behavior is normal. Judgment and thought content normal.     .BP 120/68   Ht 1.715 m (5' 7.5")   Wt 67.314 kg (148 lb 6.4 oz)   BMI 22.90 kg/m2   LMP 08/26/2011      Assessment & Plan:     V72.31 Routine gynecological examination  (primary encounter diagnosis)  Pap not indicated; next Pap 2014  STI Screen:  declines  BSA, CBE, Diagnostic Bilateral Mammogram 08/2012.  Contraception:  Vasectomy  Kegel's ~50/day, Pelvic Support handout reviewed/given.   Re-start exercise program x3/week   Constipation Card recipe  Monitor menses pattern.   RTN 1 year.    Carman Ching, NP 09/11/2011, 3:14 PM  Patient seen independently with co-signing physician present in clinic.

## 2012-03-13 ENCOUNTER — Encounter (FREE_STANDING_LABORATORY_FACILITY)
Admit: 2012-03-13 | Discharge: 2012-03-13 | Disposition: A | Discharge status: Home / Self Care | Payer: Managed Care, Other (non HMO) | Attending: Family | Admitting: Family

## 2012-03-13 DIAGNOSIS — E039 Hypothyroidism, unspecified: Secondary | ICD-10-CM | POA: Diagnosis present

## 2012-03-13 DIAGNOSIS — IMO0001 Reserved for inherently not codable concepts without codable children: Secondary | ICD-10-CM | POA: Diagnosis present

## 2012-03-13 LAB — COMPREHENSIVE METABOLIC PANEL, NON-FASTING
ALBUMIN: 4.2 g/dL (ref 3.5–4.8)
ALKALINE PHOSPHATASE: 52 U/L (ref 38–126)
ALT (SGPT): 16 U/L (ref 7–45)
ANION GAP: 10 mmol/L (ref 5–16)
AST (SGOT): 24 U/L (ref 8–41)
BILIRUBIN, TOTAL: 0.7 mg/dL (ref 0.3–1.3)
BUN/CREAT RATIO: 25 — ABNORMAL HIGH (ref 6–22)
BUN: 19 mg/dL (ref 6–20)
CALCIUM: 9.6 mg/dL (ref 8.5–10.4)
CARBON DIOXIDE: 28 mmol/L (ref 22–32)
CHLORIDE: 103 mmol/L (ref 96–111)
CREATININE: 0.77 mg/dL (ref 0.49–1.10)
ESTIMATED GLOMERULAR FILTRATION RATE: >59 ml/min/1.73m2 (ref >59)
GLUCOSE,NONFAST: 79 mg/dL (ref 65–139)
POTASSIUM: 3.6 mmol/L (ref 3.5–5.1)
SODIUM: 141 mmol/L (ref 136–145)
TOTAL PROTEIN: 6.9 g/dL (ref 6.4–8.3)

## 2012-03-13 LAB — CBC/DIFF
BASOPHILS: 0 %
BASOS ABS: 0 THOU/uL (ref 0.0–0.2)
EOS ABS: 0.1 THOU/uL (ref 0.0–0.5)
EOSINOPHIL: 1 %
HCT: 42.3 % (ref 33.5–45.2)
HGB: 14.3 g/dL (ref 11.2–15.2)
LYMPHOCYTES: 19 %
LYMPHS ABS: 1.4 THOU/uL — ABNORMAL LOW (ref 1.5–3.5)
MCH: 30.1 pg (ref 27.4–33.0)
MCHC: 33.8 g/dL (ref 32.5–35.8)
MCV: 89.1 fL (ref 78–100)
MONOCYTES: 5 %
MONOS ABS: 0.3 THOU/uL (ref 0.3–1.0)
MPV: 11.2 fL (ref 7.5–11.5)
PLATELET COUNT: 159 THOU/uL (ref 140–450)
PMN ABS: 5.7 THOU/uL (ref 2.0–6.5)
PMN'S: 75 %
RBC: 4.75 MIL/uL (ref 3.63–4.92)
RDW: 13.6 % (ref 12.0–15.0)
WBC: 7.6 THOU/uL (ref 3.5–11.0)

## 2012-03-14 LAB — THYROID STIMULATING HORMONE (SENSITIVE TSH): TSH: 3.431 u[IU]/mL (ref 0.300–5.900)

## 2012-03-18 LAB — VITAMIN D, SERUM (25 HYDROXYVITAMIN D2 AND D3 BY MS)
25 HYDROXYVITAMIN D2/D3,total: 34.6 ng/mL
25 HYDROXYVITAMIN D2: <4 ng/mL
25 HYDROXYVITAMIN D3: 34.6 ng/mL

## 2012-04-15 ENCOUNTER — Ambulatory Visit (INDEPENDENT_AMBULATORY_CARE_PROVIDER_SITE_OTHER): Payer: Self-pay

## 2012-04-15 NOTE — Telephone Encounter (Signed)
Tracie [name], please offer her the following:  [facility] office 04/16/2012 @ 11 AM or POC 04/22/2012 @ 12:30 PM for CBE/breast lump.    She has [employer]  + Hx of Breast Cysts.     Carman Ching, NP 04/15/2012, 4:51 PM

## 2012-04-15 NOTE — Telephone Encounter (Addendum)
Tracie [name] called in to request an appointment because she has found a lump in her breast.  There are no available appointment until August.  Her numbers are [phone] (w) or [phone] (c).  Please call with a sooner appointment.  Her last mammogram was 09-11-11.

## 2012-04-16 NOTE — Telephone Encounter (Signed)
Pt notified & she said this morning was too short of notice & would take the July 3 at 12:30 pm

## 2012-04-22 ENCOUNTER — Ambulatory Visit
Admission: RE | Admit: 2012-04-22 | Discharge: 2012-04-22 | Disposition: A | Discharge status: Home / Self Care | Payer: Managed Care, Other (non HMO) | Source: Ambulatory Visit | Attending: OBSTETRICS/GYNECOLOGY | Admitting: OBSTETRICS/GYNECOLOGY

## 2012-04-22 ENCOUNTER — Ambulatory Visit (HOSPITAL_COMMUNITY)
Admission: RE | Admit: 2012-04-22 | Discharge: 2012-04-22 | Disposition: A | Discharge status: Home / Self Care | Payer: Managed Care, Other (non HMO) | Source: Ambulatory Visit

## 2012-04-22 ENCOUNTER — Encounter (INDEPENDENT_AMBULATORY_CARE_PROVIDER_SITE_OTHER): Payer: Self-pay

## 2012-04-22 ENCOUNTER — Ambulatory Visit (HOSPITAL_BASED_OUTPATIENT_CLINIC_OR_DEPARTMENT_OTHER): Payer: Managed Care, Other (non HMO)

## 2012-04-22 ENCOUNTER — Telehealth (INDEPENDENT_AMBULATORY_CARE_PROVIDER_SITE_OTHER): Payer: Self-pay

## 2012-04-22 VITALS — BP 110/62 | Ht 67.5 in | Wt 148.8 lb

## 2012-04-22 DIAGNOSIS — N63 Unspecified lump in unspecified breast: Secondary | ICD-10-CM | POA: Insufficient documentation

## 2012-04-22 DIAGNOSIS — N6009 Solitary cyst of unspecified breast: Secondary | ICD-10-CM

## 2012-04-22 DIAGNOSIS — N6310 Unspecified lump in the right breast, unspecified quadrant: Secondary | ICD-10-CM

## 2012-04-22 NOTE — Progress Notes (Addendum)
Subjective:     Patient ID:  Tracie [name] is an 46 y.o. female   Chief Complaint:    Chief Complaint   Patient presents with   . Breast Lump     right        HPI  Problem Visit:    46 yo G5 P3023 Married - received 04/15/2012 telephone message - Tracie [name] called in to request an appointment because she has found a lump in her breast. There are no available appointment until August. Her numbers are [phone] (w) or [phone] (c). Please call with a sooner appointment. Her last mammogram was 09-11-11.    C/O's of  ~ 2 weeks of right breast mass that she could see through skin - hard that has softened/decreased in size over time that felt sore/achy.  No nipple d/c or discolorations.     Last Mammogram: 09/11/2011 Bilateral Screening - masses in both breast are benign; right USG - simple cysts in right are benign.  11/13/2010 Right USG: complicated cyst at 10 o'clock 1.5 x 2.3 x 2.0 cm  11/13/2010 Bilateral Diagnostic - masses in both breasts are benign.  01/03/2010 Bilateral Screening - masses in both breasts benign.  10/05/08 Negative   09/28/07 bilateral breast USG: multiple cysts with simple cysts 2.9 -3.2 cm  + hx of left breast cysts needing aspiration - 2004 ([facility]) - 15 ml turbid; 2009 ([facility]) 3 ml.     Caffeine intake: occas soda rare chocolate.  Vit E:  None  Family History   Problem Relation Age of Onset   . Heart Disease Paternal Grandfather    . Hypertension Paternal Grandfather    . Heart Disease Father    . Hypertension Father    . Uterine Fibroids Mother    . Diabetes Paternal Grandmother    . Anesth Problems Neg Hx    . Bleeding Prob Neg Hx    . Cancer Neg Hx    . Other Mother      and x2 maternal aunts with  breast cysts - non cancerous.    Marland Kitchen Healthy Mother        Past Medical History   Diagnosis Date   . Chest pain 01/28/2008     work-up negative   . Abnormal Pap smear 2001     LSIL   . Unspecified breast disorder 29562 - present      large breast cysts    . Dysplasia of cervix 2001      Colpo CIN 1     Past Surgical History   Procedure Date   . Hx tonsillectomy 1974   . Biopsy breast 10/12/03; 11/03/07      left breast aspirations x 2 - 2004 - 15 ml turbid and 2009 - 3 ml        Family History   Problem Relation Age of Onset   . Heart Disease Paternal Grandfather    . Hypertension Paternal Grandfather    . Heart Disease Father    . Hypertension Father    . Uterine Fibroids Mother    . Diabetes Paternal Grandmother    . Anesth Problems Neg Hx    . Bleeding Prob Neg Hx    . Cancer Neg Hx    . Other Neg Hx    . Healthy Mother        Outpatient Prescriptions Prior to Visit:  Amiloride-Hydrochlorothiazide 5-50 mg Tab take by mouth. 1 daily   naproxen (NAPROSYN) 500 mg Tab take by mouth.  As needed   Pilocarpine HCl 7.5 mg Tab take by mouth. Sustitition for [facility] 7.5mg   Take one tab by mouth three times daily   Multivitamin Cap Prenatal   Flecainide (TAMBOCOR) 50 mg Tab take 50 mg by mouth Twice daily.    Zinc 50 mg Tab take by mouth.    SUPER B COMPLEX PO take by mouth.    doxepin (SINEQUAN) 10 mg Oral Capsule take 1 Cap by mouth every night.   Potassium 99 mg Tab take by mouth. 2 daily   Estradiol 0.01 % (0.1 mg/g) Crea 2 g by Vaginal route. 2.0 grams externally twice weekly      Allergies   Allergen Reactions   . Tetracycline        Review of Systems   Genitourinary:        Right breast mass - feels weird and raised up through skin           Objective:   Physical Exam   Constitutional: She appears well-developed and well-nourished. No distress.   Breast exam:     Right normal breast;  with no masses, skin changes or nipple discharge.  Left normal breast;  with no masses, skin changes or nipple discharge.  There are fibrocystic changes to the right breast.  There are fibrocystic changes to the left breast.  Tenderness (at 12 o'clock ) to right breast.  No tenderness to left breast.  No right axillary lymph nodes are palpable.  No left axillary lymph nodes are palpable.  Left breast mass (1) is 5.5 cm cm., located at 2 O'Clock, round in shape, mobile, soft in consistency .  Left breast mass (2) is 2  cm., located at 8 O'Clock, oblong in shape, mobile, soft in consistency.  Right breast mass (1) is 3.75  cm., located at 12 O'Clock, irregular in shape, mobile, hard in consistency .  Right breast mass (2) is 2.5  cm., located at 9 O'Clock, mobile, soft in consistency .  2.25 cm in right axilla/tail of Spence; mobile; soft    1.5 cm left distal 11/UOQ; mobile; soft.         Neurological: She is alert.   Skin: Skin is warm.   Psychiatric: She has a normal mood and affect. Her behavior is normal. Judgment and thought content normal.     .  BP 110/62   Ht 1.715 m (5' 7.5")   Wt 67.5 kg (148 lb 13 oz)   BMI 22.96 kg/m2   LMP 03/21/2012    Assessment & Plan:     611.72 Lump of breast, right  (primary encounter diagnosis)    Plan:  Start Vit E 400 IU/day; low to none caffeine intake  Diagnostic Bilateral Mammogram and right breast USG ordered - call results.       Carman Ching, NP 04/22/2012, 1:43 PM  Patient seen independently with co-signing physician present in clinic.  Jola Babinski, MD 04/23/2012, 9:50 AM

## 2012-04-22 NOTE — Telephone Encounter (Signed)
04/22/2012 RIGHT BREAST ULTRASOUND FINDINGS:   There is a complex cyst measuring 24 millimeters seen in the right breast at 12 o'clock located 2 centimeters from the nipple. Finding correlates to the palpable abnormality in the right breast at 12 o'clock.   IMPRESSION:   Complex cyst in the right breast is suspicious. This finding is amenable to minimally invasive Ultrasound guided breast biopsy.   BI-RADS Category 4A: Suspicious Abnormality (Low Suspicion for Malignancy)    Plan:  Ordered Right USG guided breast biopsy.   Called patient to review right USG results - has USG Guided biopsy scheduled on 04/28/2012 at 10 AM in the [facility] per patient  Carman Ching, NP 04/22/2012, 6:46 PM

## 2012-04-28 ENCOUNTER — Ambulatory Visit
Admission: RE | Admit: 2012-04-28 | Discharge: 2012-04-28 | Disposition: A | Discharge status: Home / Self Care | Payer: Managed Care, Other (non HMO) | Source: Ambulatory Visit

## 2012-04-28 ENCOUNTER — Other Ambulatory Visit (HOSPITAL_COMMUNITY): Payer: Self-pay | Admitting: Diagnostic Radiology

## 2012-04-28 DIAGNOSIS — N641 Fat necrosis of breast: Secondary | ICD-10-CM | POA: Insufficient documentation

## 2012-04-28 DIAGNOSIS — N6489 Other specified disorders of breast: Secondary | ICD-10-CM | POA: Insufficient documentation

## 2012-04-28 MED ORDER — LIDOCAINE HCL 10 MG/ML (1 %) INJECTION SOLUTION
2.00 mL | Freq: Once | INTRAMUSCULAR | Status: AC
Start: 2012-04-28 — End: 2012-04-28
  Administered 2012-04-28: 20 mg via INTRADERMAL

## 2012-04-28 MED ORDER — LIDOCAINE 1 %-EPINEPHRINE 1:100,000 INJECTION SOLUTION
5.00 mL | Freq: Once | INTRAMUSCULAR | Status: AC
Start: 2012-04-28 — End: 2012-04-28
  Administered 2012-04-28: 50 mg via INTRAMUSCULAR

## 2012-04-28 NOTE — Discharge Instructions (Signed)
Minimally Invasive Breast Biopsy Care Instructions     You may take a shower today.Let the water run over the incision. Pat it dry. No bathing or soaking your breast in water until the glue flakes off.   If the incision comes open, place a dry band-aid on it. DO NOT apply ointments to the incision.   Check your incision frequently (every hour for the next four hours). If there would be bleeding present, apply firm pressure against the incision. If after 10 minutes of pressure, the area does not stop bleeding, go to the nearest E.R or Urgent Care Center.   Wear your bra continuously for the next 48 hours.   Keep ice on the incision until bedtime tonight. It is important that the ice pack does not touch your bare skin as it can cause frostbite.   You may have mild to moderate discomfort and a small to moderate amount of bruising. This is normal.   If you need medication for discomfort, take non-aspirin products (such as Tylenol, Motrin, Advil, or Ibuprofen), as directed by the manufacture for pain. DO NOT USE ASPIRIN for 48 hours.   Please avoid strenuous activities, such as, heavy cleaning, sweeping, vacuuming, yard work, tennis, golf, aerobics, skiing, weight lifting for 72 hours. You cannot lift anything with the affected side greater than 5 pounds for the next 72 hours.   Watch for signs of infection. Excessive bleeding, drainage, swelling, redness, heat, pain, or fever. If that should happen, please call [phone], or your physician. If it would be on a weekend please go to an Urgent Care Center or E.R.   For several days or even a couple weeks, you may have tenderness, "twinges", and a bump. This can be bothersome, but is not abnormal. Hot, moist packs using a wet washcloth heated in a microwave for 10 seconds and placed in a plastic bag may make this feel better. Do not use a heating pad. Usually this will disappear completely with a little time.   We will call you once we receive your pathology  report.  Approximate time is seven to fourteen days.   If you should have any questions or complications, please call Brevon Dewald, [facility] Nurse at [phone]

## 2012-04-28 NOTE — Procedures (Signed)
[facility]    [name]  Breast Biopsy Pre/Post Assessment       Tracie [name],Tracie [name]  Date of Service: 04/28/2012    Family Present in Biopsy Room:no  Emotional Status:Anxious       TIME OUT DOCUMENTATION:  Correct patient: Yes  Correct patient position: Yes  Correct procedure: Yes  Correct side and site are marked: Yes  Correct equipment: Yes  Name of technologist present for time out: [name]  Name of physician present for time out: Burnard Hawthorne, MD  Name of resident present for time out: Arna Medici, MD  Name of nurse present for time out: [name]  Time out - Site1: 1025        Allergies   Allergen Reactions   . Tetracycline      Patient Medications :Patient is not currently taking Aspirin, Plavix or Coumadin  Consent signed: Arna Medici, MD                          Site Marked: Arna Medici, MD   Patient Postioning Time: 1025    PROCEDURE:  Procedure:RUBX  Procedure Start: 1025  Dressing Applied: Dermabond  Complications: no  Procedure Completed Time: 1050  Patient Tolerated Procedure:Good  Clip Manufacture: [employer]                         Lot #:[id number]    POST PROCEDURE CARE:  Ace Wrap Applied:no  Ice Applied to Site:yes  Evidence of Bleeding:no  Condition at Discharge: Good  Written Discharge Instructions:yes  Follow Up With Pathology Report [facility]:yes    Time of Discharge:1100    Present During Procedure:  Physician:[name], MD  Technologist:[name]  Nurse:[name]  Resident: Arna Medici, MD    Comments/notes:     [name], RN, 04/28/2012 10:15 AM                                        [name], RN 04/28/2012, 10:15 AM

## 2012-04-29 LAB — HISTORICAL SURGICAL PATHOLOGY SPECIMEN

## 2012-04-30 ENCOUNTER — Other Ambulatory Visit (INDEPENDENT_AMBULATORY_CARE_PROVIDER_SITE_OTHER): Payer: Self-pay

## 2012-04-30 ENCOUNTER — Institutional Professional Consult (permissible substitution) (HOSPITAL_COMMUNITY): Payer: Self-pay

## 2012-05-01 ENCOUNTER — Telehealth (INDEPENDENT_AMBULATORY_CARE_PROVIDER_SITE_OTHER): Payer: Self-pay

## 2012-05-01 DIAGNOSIS — N6009 Solitary cyst of unspecified breast: Secondary | ICD-10-CM

## 2012-05-01 DIAGNOSIS — Z1231 Encounter for screening mammogram for malignant neoplasm of breast: Secondary | ICD-10-CM

## 2012-05-01 NOTE — Telephone Encounter (Signed)
MAMMOTOME STEREOTACTIC BIOPSY FOLLOW-UP CONSULT  As per phone conversation with the patient. The patient has experienced   no difficulty since the stereotactic mammotome breast biopsy performed on   04/28/2012. The incision is closed with no redness, swelling, drainage or   bleeding. There is minimal ecchymosis. The patient has no complaints.    HISTOLOGY:  Pathology obtained shows breast tissue with abundant histiocytes and   inflammatory cells.    FOLLOW-UP:  The patient was advised to schedule for a follow up five month bilateral   mammogram to get patient back on her yearly schedule     Plan: Lawson Fiscal, please call her and tell her that I ordered her Screening Mammogram for 10/06/2012 - she can call for appt.     Carman Ching, NP 05/01/2012, 5:18 PM

## 2012-05-04 NOTE — Telephone Encounter (Signed)
Notified pt & her mammogram was already scheduled.  She wanted to make Pam's appt the same day as she lives 1.5 hrs away.  Transferred out to psr to reschedule her appt. with Elita Quick

## 2012-09-15 ENCOUNTER — Ambulatory Visit (INDEPENDENT_AMBULATORY_CARE_PROVIDER_SITE_OTHER): Payer: Managed Care, Other (non HMO)

## 2012-09-16 ENCOUNTER — Ambulatory Visit (INDEPENDENT_AMBULATORY_CARE_PROVIDER_SITE_OTHER): Payer: Managed Care, Other (non HMO)

## 2012-09-16 ENCOUNTER — Ambulatory Visit (HOSPITAL_COMMUNITY): Payer: Self-pay

## 2012-10-21 DIAGNOSIS — M542 Cervicalgia: Secondary | ICD-10-CM

## 2012-10-21 DIAGNOSIS — R1013 Epigastric pain: Secondary | ICD-10-CM

## 2012-10-21 HISTORY — DX: Cervicalgia: M54.2

## 2012-10-21 HISTORY — DX: Epigastric pain: R10.13

## 2012-11-17 ENCOUNTER — Encounter (INDEPENDENT_AMBULATORY_CARE_PROVIDER_SITE_OTHER): Payer: Self-pay

## 2012-11-17 ENCOUNTER — Ambulatory Visit
Admission: RE | Admit: 2012-11-17 | Discharge: 2012-11-17 | Disposition: A | Discharge status: Home / Self Care | Payer: Managed Care, Other (non HMO) | Source: Ambulatory Visit

## 2012-11-17 ENCOUNTER — Ambulatory Visit (HOSPITAL_BASED_OUTPATIENT_CLINIC_OR_DEPARTMENT_OTHER): Payer: Managed Care, Other (non HMO)

## 2012-11-17 ENCOUNTER — Other Ambulatory Visit (INDEPENDENT_AMBULATORY_CARE_PROVIDER_SITE_OTHER): Payer: Self-pay

## 2012-11-17 ENCOUNTER — Ambulatory Visit (HOSPITAL_BASED_OUTPATIENT_CLINIC_OR_DEPARTMENT_OTHER)
Admission: RE | Admit: 2012-11-17 | Discharge: 2012-11-17 | Disposition: A | Discharge status: Home / Self Care | Payer: Managed Care, Other (non HMO) | Source: Ambulatory Visit

## 2012-11-17 VITALS — BP 114/68 | Ht 67.0 in | Wt 152.1 lb

## 2012-11-17 DIAGNOSIS — N952 Postmenopausal atrophic vaginitis: Secondary | ICD-10-CM

## 2012-11-17 DIAGNOSIS — N905 Atrophy of vulva: Secondary | ICD-10-CM | POA: Insufficient documentation

## 2012-11-17 DIAGNOSIS — Z1151 Encounter for screening for human papillomavirus (HPV): Secondary | ICD-10-CM | POA: Insufficient documentation

## 2012-11-17 DIAGNOSIS — Z87891 Personal history of nicotine dependence: Secondary | ICD-10-CM | POA: Insufficient documentation

## 2012-11-17 DIAGNOSIS — R1013 Epigastric pain: Secondary | ICD-10-CM | POA: Insufficient documentation

## 2012-11-17 DIAGNOSIS — Z01419 Encounter for gynecological examination (general) (routine) without abnormal findings: Secondary | ICD-10-CM

## 2012-11-17 DIAGNOSIS — Z658 Other specified problems related to psychosocial circumstances: Secondary | ICD-10-CM | POA: Insufficient documentation

## 2012-11-17 DIAGNOSIS — Z1231 Encounter for screening mammogram for malignant neoplasm of breast: Secondary | ICD-10-CM | POA: Insufficient documentation

## 2012-11-17 DIAGNOSIS — N8111 Cystocele, midline: Secondary | ICD-10-CM | POA: Insufficient documentation

## 2012-11-17 MED ORDER — ESTRADIOL 0.01% (0.1 MG/GRAM) VAGINAL CREAM
TOPICAL_CREAM | VAGINAL | Status: DC
Start: 2012-11-17 — End: 2013-07-06

## 2012-11-17 NOTE — Progress Notes (Addendum)
Subjective:     Patient ID:  Tracie [name] is an 47 y.o. female   Chief Complaint:    Chief Complaint   Patient presents with   . Gyn Exam     Annual       HPI  Return Patient     + family stressors - multiple  + somatic neck pain > muscle relaxant med  + heartburn > Zantac.     Partner status: Married   Last Pap: 01/03/2010 Negative; random check - 12/08 HR Negative.     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     + Breast Cysts Hx   Last Mammogram: Today - Negative  04/28/2012 RIGHT BREAST 12 O'CLOCK, CORE NEEDLE BIOPSY:  - Breast tissue with abundant histiocytes and inflammatory cells  (lymphocytes, scattered neutrophils and   scattered eosinophils). The changes are compatible with fat necrosis.   - Rare microcalcification.  - No malignancy identified.    09/11/2011 Bilateral Screening - masses in both breast are benign; right USG - simple cysts in right are benign.  11/13/2010 Right USG: complicated cyst at 10 o'clock 1.5 x 2.3 x 2.0 cm  11/13/2010 Bilateral Diagnostic - masses in both breasts are benign.  01/03/2010 Bilateral Screening - masses in both breasts benign.  10/05/08 Negative   09/28/07 bilateral breast USG: multiple cysts with simple cysts 2.9 -3.2 cm   + hx of left breast cysts needing aspiration - 2004 ([facility]) - 15 ml turbid; 2009 ([facility]) 3 ml.     Contraception: Vasectomy   Menses: mostly monthly until 08/2012 -- x 2 /month then no menses in 09/2012/4 days/heavy MC Day 2-3 heavy > moderate to light flow; had x 2 menses in 02/2011 .  2009 menometrorrhagia with EMBX - secretory; no malignancy/hyperplasia.   Vasomotor Symptoms: HF - none; NS - nightly; VD - + recently > used Estrace Cream in past  insomnia - yes, recently.   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    History     Social History   . Marital Status: Married     Spouse Name: Kendell Bane     Number of Children: 3   . Years of Education: 12     Occupational History   . 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     Social History Main Topics   . Smoking status: Former Smoker -- 0.25 packs/day for 2 years     Types: Cigarettes     Quit date: 10/22/1991   . Smokeless tobacco: Never Used   . Alcohol Use: No   . Drug Use: No   . Sexually Active: Yes -- Female partner(s)     Birth Control/ Protection: Vasectomy     Other Topics Concern   . Abuse/Domestic Violence No   . Breast Self Exam Yes   . Caffeine Concern No   . Calcium Intake Adequate  No   . Computer Use Yes   . Exercise Concern No   . Helmet Use Yes   . Seat Belt Yes   . Special Diet Yes     low sodium diet   . Sunscreen Used No   . Uses Gait Assitive Device (Cane, Walker, Etc) No   . Right Hand Dominant Yes   . Left Hand Dominant No   . Ambidextrous No     Social History Narrative   . No narrative on file     Past Medical History   Diagnosis Date   . Chest pain 01/28/2008     work-up negative   . Abnormal Pap smear 2001     LSIL   . Unspecified breast disorder 16109 - present      large breast cysts    . Dysplasia of cervix 2001     Colpo CIN 1     Past Surgical History   Procedure Laterality Date   . Hx tonsillectomy  1974   . Biopsy breast  10/12/03; 11/03/07      left breast aspirations x 2 - 2004 - 15 ml turbid and 2009 - 3 ml      Family History   Problem Relation Age of Onset   . Heart Disease Paternal Grandfather    . Hypertension Paternal Grandfather    . Heart Disease Father    . Hypertension Father    . Uterine Fibroids Mother    . Diabetes Paternal Grandmother    . Anesth Problems Neg Hx    . Bleeding Prob Neg Hx    . Cancer Neg Hx    . Other Mother       and x2 maternal aunts with  breast cysts - non cancerous.    Marland Kitchen Healthy Mother        Outpatient Prescriptions Prior to Visit:  Amiloride-Hydrochlorothiazide 5-50 mg Tab take by mouth. 1 daily   Coenzyme Q10 (CO Q-10) 100 mg Oral Capsule Take 100 mg by mouth Once a day   Flecainide (TAMBOCOR) 50 mg Tab take 50 mg by mouth Twice daily.    Lutein 20 mg Oral Capsule Take by mouth   Multivitamin Cap Prenatal   naproxen (NAPROSYN) 500 mg Tab take by mouth. As needed   Pilocarpine HCl 7.5 mg Tab take by mouth. Sustitition for [facility] 7.5mg   Take one tab by mouth three times daily   Potassium 99 mg Tab take by mouth. 2 daily   SUPER B COMPLEX PO take by mouth.    Zinc 50 mg Tab take by mouth.    doxepin (SINEQUAN) 10 mg Oral Capsule take 1 Cap by mouth every night.   Estradiol 0.01 % (0.1 mg/g) Crea 2 g by Vaginal route. 2.0 grams externally twice weekly      No facility-administered medications prior to visit.  Allergies   Allergen Reactions   . Tetracycline      Review of Systems   Constitutional: Negative.    HENT: Positive for neck pain.    Eyes: Negative.    Respiratory: Negative.    Cardiovascular: Negative.    Gastrointestinal: Positive for heartburn.   Genitourinary: Negative.    Skin: Negative.    Neurological: Negative.    Endo/Heme/Allergies: Negative.    Psychiatric/Behavioral: The patient has insomnia.        Objective:   Physical Exam   Constitutional: She is oriented to person, place, and time. She appears well-developed and well-nourished. No distress.  Pulm:  Effort normal.    Breast exam:    Right normal breast;  with no masses, skin changes or nipple discharge.  Left normal breast;  with no masses, skin changes or nipple discharge.  Incision(s)  to right breast: well healed (prior bx site).   There are fibrocystic changes (varying sized cysts) to the right breast.  There are fibrocystic changes (varying sized cysts) to the left breast.  No mass within the left breast.  No mass within the right breast.   No tenderness to right breast.  No right axillary lymph nodes are palpable.  No left axillary lymph nodes are palpable.    Abdomen:   Soft.   GU:    External Exam:    External exam normal (moderate atrophy).    No genital lesions present.      Vaginal Exam:    Vagina normal (moderate atrophy; increased laxity; + menses) to exam.  Cystocele present.  Cystocele stage +3 (no leak with cough).  Rectocele present.  Rectocele stage +1.  No vaginal discharge present.  No vaginal odor present.  Cervix is normal (bulbous with multiple nabothian cysts.) to exam.      Bladder:    Bladder is normal to exam.      Uterus:   Uterus is normal to exam.     Position:  Anteverted.    Contour:  Regular.    Mobility:  Mobile.            Adenexal findings:    No masses and no tenderness.          Rectal Exam:   Rectum normal to exam.   No external hemorrhoid(s) present.  Kegel 1/4  Neurological: She is alert and oriented to person, place, and time.   Skin: Skin is warm and dry.   Psychiatric: She has a normal mood and affect. Her behavior is normal. Judgment and thought content normal.     .  BP 114/68  Ht 1.702 m (5\' 7" )  Wt 69 kg (152 lb 1.9 oz)  BMI 23.82 kg/m2  LMP 11/13/2012       Assessment & Plan:     1. Routine gynecological examination (V72.31)  CYTOPATHOLOGY-GYN (PAP AND HPV TESTS)   2. Other screening mammogram (V76.12)  MAMMO BILATERAL SCREENING   3. Atrophy of vulva (624.1)  Estradiol 0.01 % (0.1 mg/gram) Vaginal Cream   4. Vaginal atrophy (627.3)  Estradiol 0.01 % (0.1 mg/gram) Vaginal Cream     Pap with HR testing today  STI Screen:  declines  BSA, CBE yearly   Contraception: Vasectomy  Kegel's ~25/day.  Constipation recipe card given to trial - better bowel hygiene  Encouraged CBT with local therapist for family stressors  Menses Diary Card to monitor menses; may need repeat EMBX  Discussed non hormonal options for VMS  Continue Vit E 400 IU/day for breast cyst tenderness.  RTN 6 mos F/U irregular menses and VMS  RTN  1 year.    Carman Ching, NP 11/17/2012, 11:15 AM  Patient seen independently with co-signing physician present in clinic.            Note signed by me for administrative purposes only.    Petronela Meszaros,MD

## 2012-11-18 LAB — HISTORICAL CYTOPATHOLOGY-GYN (PAP AND HPV TESTS)

## 2012-11-23 ENCOUNTER — Encounter (INDEPENDENT_AMBULATORY_CARE_PROVIDER_SITE_OTHER): Payer: Self-pay

## 2012-12-02 ENCOUNTER — Encounter (INDEPENDENT_AMBULATORY_CARE_PROVIDER_SITE_OTHER): Payer: Self-pay

## 2013-05-20 ENCOUNTER — Ambulatory Visit (INDEPENDENT_AMBULATORY_CARE_PROVIDER_SITE_OTHER): Payer: Self-pay

## 2013-05-20 NOTE — Telephone Encounter (Signed)
Message copied by Milas Kocher on Thu May 20, 2013  8:27 AM  ------       Message from: Rolan Bucco CATHERINE       Created: Wed May 19, 2013 11:01 AM         >> [employer] 05/17/2013 11:53 AM       COURTNEY PT              Patient states that her menses has gotten better.       She wants to know if she still needs to be seen or if she can just schedule her annual and mammo.  ------

## 2013-05-20 NOTE — Telephone Encounter (Signed)
Appointment scheduled for annual and letter mailed to patient.

## 2013-05-21 ENCOUNTER — Ambulatory Visit (INDEPENDENT_AMBULATORY_CARE_PROVIDER_SITE_OTHER): Payer: Managed Care, Other (non HMO)

## 2013-06-11 ENCOUNTER — Ambulatory Visit (INDEPENDENT_AMBULATORY_CARE_PROVIDER_SITE_OTHER): Payer: Self-pay

## 2013-06-11 DIAGNOSIS — R102 Pelvic and perineal pain: Secondary | ICD-10-CM

## 2013-06-11 NOTE — Telephone Encounter (Signed)
Message copied by Carman Ching on Fri Jun 11, 2013  4:14 PM  ------       Message from: Zada Finders       Created: Fri Jun 11, 2013  1:17 PM         >> Georgina Peer 06/11/2013 01:09 PM       Message is for [employer]. Pt called and wanted seen before October for pain and pressure.  We had nothing available.  Has ask that a message be sent and I have placed her on a wait list also.  Thank you  ------

## 2013-06-11 NOTE — Telephone Encounter (Signed)
-----   Message -----   Pt cancelled an appt on 05-21-13  From: Georgina Peer Sent: 06/11/2013 1:09 PM To: [facility]    Called patient to triage:  LMP: 05/27/2013 x 7 days  Heavy x 2 days then light flow  PMP:  7/22-26/2014   Light     Plan: Lawson Fiscal, Please call her to schedule her on 9/16 at 3 PM for pelvic pressure/irregular menses and help her get USG prior to my appt that same afternoon (I ordered).  Carman Ching, NP 06/11/2013, 4:27 PM

## 2013-06-14 NOTE — Telephone Encounter (Signed)
 I have this pt scheduled but had to leave a message for pt to return call for her appt dates/times. Olam Connor, psr

## 2013-07-06 ENCOUNTER — Ambulatory Visit (HOSPITAL_BASED_OUTPATIENT_CLINIC_OR_DEPARTMENT_OTHER)
Admission: RE | Admit: 2013-07-06 | Discharge: 2013-07-06 | Disposition: A | Discharge status: Home / Self Care | Payer: Managed Care, Other (non HMO) | Source: Ambulatory Visit

## 2013-07-06 ENCOUNTER — Encounter (INDEPENDENT_AMBULATORY_CARE_PROVIDER_SITE_OTHER): Payer: Self-pay

## 2013-07-06 ENCOUNTER — Ambulatory Visit
Admission: RE | Admit: 2013-07-06 | Discharge: 2013-07-06 | Disposition: A | Discharge status: Home / Self Care | Payer: Managed Care, Other (non HMO) | Source: Ambulatory Visit | Attending: OBSTETRICS/GYNECOLOGY | Admitting: OBSTETRICS/GYNECOLOGY

## 2013-07-06 ENCOUNTER — Ambulatory Visit (HOSPITAL_BASED_OUTPATIENT_CLINIC_OR_DEPARTMENT_OTHER): Payer: Managed Care, Other (non HMO)

## 2013-07-06 VITALS — BP 110/58 | Ht 67.0 in | Wt 159.8 lb

## 2013-07-06 DIAGNOSIS — N920 Excessive and frequent menstruation with regular cycle: Secondary | ICD-10-CM | POA: Insufficient documentation

## 2013-07-06 DIAGNOSIS — Z87410 Personal history of cervical dysplasia: Secondary | ICD-10-CM | POA: Insufficient documentation

## 2013-07-06 DIAGNOSIS — N6009 Solitary cyst of unspecified breast: Secondary | ICD-10-CM | POA: Insufficient documentation

## 2013-07-06 DIAGNOSIS — N921 Excessive and frequent menstruation with irregular cycle: Secondary | ICD-10-CM

## 2013-07-06 DIAGNOSIS — N9489 Other specified conditions associated with female genital organs and menstrual cycle: Secondary | ICD-10-CM

## 2013-07-06 DIAGNOSIS — N8111 Cystocele, midline: Secondary | ICD-10-CM

## 2013-07-06 DIAGNOSIS — R102 Pelvic and perineal pain: Secondary | ICD-10-CM

## 2013-07-06 MED ORDER — IBUPROFEN 800 MG TABLET
ORAL_TABLET | ORAL | Status: DC
Start: 2013-07-06 — End: 2013-12-29

## 2013-07-06 NOTE — Progress Notes (Signed)
07/06/13 1600   Urine test  (Siemens Multistix 10 SG)   Time collected 1625   Color Yellow   Glucose Negative   Bilirubin Negative   Ketones Trace (5 mg/dl)   Specific Gravity 1.610   Blood (urine) Negative   pH 6.0   Protein Negative   Urobilinogen Normal    Nitrite Negative   Leukocytes Negative   Bottle Number   (Siemens Multistix 10 SG) a24   Lot # [id number]   Expiration Date 05/20/14   Initials lks

## 2013-07-06 NOTE — Progress Notes (Addendum)
Subjective:     Patient ID:  Tracie [name] is an 47 y.o. female   Chief Complaint:    Chief Complaint   Patient presents with   . Irregular Menses   . Pelvic Pain       HPI  Problem Visit:    47 yo G5 P36 Married presents with complaints of:  ~ 1 month of pelvic pressure with pain and ~ 7 mos of irregular menses.     Onset: 1 month  Location:  B/L pelvis  Duration:  intermittent  Character:  Rates 4  Alleviating/Aggravating:  Lying supine; ??  Radiating:  Focal   Treatment:  Tylenol ES relieves.  Bladder:  No UTI sx's  No UI  Bowel:  + constipation  x2/week    Menses Diary:  Every 16-32 days/4-8 days/light to heavy (super tampon every 2 hrs.)  VMS:  HF - none; NS - none; VD - none but prescribed Estrace Cream (doesn't use);   + Mood swings (PCP tried some meds)  Labs: States she just got labs done at PCP office 05/2013 TSH - WNL - has been fluctuating    Past Medical History   Diagnosis Date   . Chest pain 01/28/2008     work-up negative   . Abnormal Pap smear 2001     LSIL   . Unspecified breast disorder 16109 - present      large breast cysts    . Dysplasia of cervix 2001     Colpo CIN 1   . Psychosocial stressors 2013 - 2014    . Acute neck pain 2014      muscle relaxant med   . Dyspepsia 2014     Zantac med   . Breast cyst      bilateral, multiple, variable sizes.      Past Surgical History   Procedure Laterality Date   . Hx tonsillectomy  1974   . Biopsy breast  10/12/03; 11/03/07,  04/28/2012      left breast aspirations x 2 - 2004 - 15 ml turbid and 2009 - 3 ml ; 04/28/2012 Right with rare calcifications.     Family History   Problem Relation Age of Onset   . Heart Disease Paternal Grandfather    . Hypertension Paternal Grandfather    . Heart Disease Father    . Hypertension Father    . Uterine Fibroids Mother      Hysterectomy at age 51   . Diabetes Paternal Grandmother    . Anesth Problems Neg Hx    . Bleeding Prob Neg Hx    . Cancer Neg Hx    . Other Mother       and x2 maternal aunts with  breast cysts - non cancerous.    Marland Kitchen Healthy Mother    . Other Sister      Menorrhagia with Ablation        Outpatient Prescriptions Prior to Visit:  Coenzyme Q10 (CO Q-10) 100 mg Oral Capsule Take 100 mg by mouth Once a day   cycloSPORINE (RESTASIS) 0.05 % Ophthalmic Dropperette Instill 1 Drop into both eyes Every 12 hours   Lutein 20 mg Oral Capsule Take by mouth   Multivitamin Cap Prenatal   naproxen (NAPROSYN) 500 mg Tab take by mouth. As needed   Pilocarpine HCl 7.5 mg Tab take by mouth. Sustitition for [facility] 7.5mg   Take one tab by mouth three times daily   Potassium 99 mg Tab take by mouth.  2 daily   Ranitidine HCl (ZANTAC) 300 mg Oral Tablet Take 300 mg by mouth Every evening   SUPER B COMPLEX PO take by mouth.    Zinc 50 mg Tab take by mouth.    Amiloride-Hydrochlorothiazide 5-50 mg Tab take by mouth. 1 daily   Estradiol 0.01 % (0.1 mg/gram) Vaginal Cream 2 grams into vagina and externally twice weekly  RXA 1416 Expiration 04/19/2014   Flecainide (TAMBOCOR) 50 mg Tab take 50 mg by mouth Twice daily.    methocarbamol (ROBAXIN) 500 mg Oral Tablet Take 500 mg by mouth Once a day 1/2 to 1 tab at bedtime. RXA 1416 Expiration 04/19/2014     No facility-administered medications prior to visit.  Allergies   Allergen Reactions   . Tetracycline        Review of Systems   Genitourinary:        Pelvic pressure with pain for 1 month  Irregular menses ?         Objective:   Physical Exam   Constitutional: She is oriented to person, place, and time. She appears well-developed and well-nourished. No distress.   Abdomen:   Soft.   GU:    External Exam:    External exam normal.    No genital lesions present.      Vaginal Exam:     Vagina normal to exam.  Cystocele present.  Cystocele stage +3 (no leak with valsalva).  Rectocele present.  Rectocele stage +1.  Positive for vaginal prolapse (Mild).  No vaginal discharge present.  No vaginal odor present.  Cervix is normal (multiple nabothian cysts on exocervix. ) to exam.      Bladder:    Bladder is normal to exam.      Uterus:   Uterus is normal to exam.     Position:  Anteverted.    Contour:  Regular.    Mobility:  Mobile.            Adenexal findings:    No masses and no tenderness.          Rectal Exam:   Rectum normal to exam.   Kegel 1/4  Neurological: She is alert and oriented to person, place, and time.   Skin: Skin is warm and dry.   Psychiatric: She has a normal mood and affect. Her behavior is normal. Judgment and thought content normal.     . BP 110/58   Ht 1.702 m (5\' 7" )   Wt 72.5 kg (159 lb 13.3 oz)   BMI 25.03 kg/m2   LMP 06/25/2013    Urine Dipstick:  See Nurses Note - all negative except small ketones.     Assessment & Plan:       ICD-9-CM    1. Pelvic pressure in female 625.8 POCT URINE DIPSTICK   2. Menometrorrhagia 626.2 ENDOMETRIAL BIOPSY (PIPELLE) (AMB ONLY)     PATHOLOGY, INITIAL SURGICAL SPECIMEN     FSH     ESTRADIOL   3. Menorrhagia 626.2 ENDOMETRIAL BIOPSY (PIPELLE) (AMB ONLY)     PATHOLOGY, INITIAL SURGICAL SPECIMEN     FSH     ESTRADIOL   4. Cystocele, midline 618.01        Plan:  Urine Dipstick  Discussion of menses pattern - recommended EMBX today since irregular  Check FSH, Estradiol labs; Release of Info for PCP labs.   Menses Diary Card given  Discussion of cystocele and management options - do nothing, pessary, surgery  RTN 08/03/2013 at 3:40  PM for F/U [facility] and possible pessary fitting.       Carman Ching, NP 07/06/2013, 4:44 PM  Patient seen independently with co-signing physician present in clinic.

## 2013-07-06 NOTE — Procedures (Addendum)
UBHCG:  none  Pre-medicated:  Motrin 800 mg po  Endometrial biopsy consent signed.  Time out performed.    Exocervix cleansed with Hibiclens x 3  Pipelle advanced to 7 cm .  Specimen obtained x 2 passes (small amount specimen) and sent to pathology.  Patient tolerated.    Carman Ching, NP

## 2013-07-08 ENCOUNTER — Ambulatory Visit (INDEPENDENT_AMBULATORY_CARE_PROVIDER_SITE_OTHER): Payer: Self-pay

## 2013-07-08 LAB — HISTORICAL SURGICAL PATHOLOGY SPECIMEN

## 2013-07-08 NOTE — Telephone Encounter (Signed)
Message copied by Zada Finders on Thu Jul 08, 2013  9:58 AM  ------       Message from: [employer]       Created: Wed Jul 07, 2013  2:29 PM         >> Mikey [employer] 07/07/2013 02:29 PM       [facility] pt              Patient was seen yesterday and labs from her PCP (Dr. George Ina) were to be faxed to Pam's attention.       She wants to know if Pam received those.  If so, she wants to know if they were the same labs that she wanted her to get drawn.       If they were, does she still need to have the labs done that the orders were written for yesterday?              After 5pm [phone]         ------

## 2013-07-08 NOTE — Telephone Encounter (Signed)
Pam looked at the labs from her PCP & she does not need to have any further labs drawn.  Pt notified

## 2013-07-16 ENCOUNTER — Ambulatory Visit (HOSPITAL_BASED_OUTPATIENT_CLINIC_OR_DEPARTMENT_OTHER): Payer: Self-pay

## 2013-07-16 NOTE — Telephone Encounter (Signed)
Reviewed emb result.  Pt is going to go to a provider that is closer to home that her sister goes to & won't have to drive to [place].

## 2013-07-16 NOTE — Telephone Encounter (Signed)
Message copied by Carman Ching on Fri Jul 16, 2013  6:43 AM  ------       Message from: Zada Finders       Created: Thu Jul 15, 2013  2:46 PM         >> Holly Bodily 07/15/2013 02:29 PM       [facility] pt          The patient asked if Pam would call her with the results of her biopsy that was done on Sept 16th.   Please advise the patient at [phone].   Thank you  ------

## 2013-07-16 NOTE — Telephone Encounter (Signed)
Plan at last visit 07/06/2013:  Release of Info for PCP labs - received outside office labs (in my mailbox)  Menses Diary Card given   Discussion of cystocele and management options - do nothing, pessary, surgery   RTN 08/03/2013 at 3:40 PM for F/U [facility] and possible pessary fitting.      ----- Message -----   From: Holly Bodily Sent: 07/15/2013 2:29 PM   To: [facility] Nurses  Pt cancelled her 10-14 appt with you. Ok to tell her negative emb? Zada Finders, RN    07/06/2013 ENDOMETRIUM, BIOPSY:  1. Proliferative endometrium.  2. No evidence of chronic endometritis, hyperplasia, or neoplasia is seen.      Plan:  Lawson Fiscal, See plan above - ask if she declines pessary fitting and menorrhagia treatment ?    Yes, you can release her results - Negative for cancer.   Offer to re-schedule - she had pelvic pain with pelvic organ prolapse GR 3 +cystocele/GR 1+ rectocele.      Carman Ching, NP 07/16/2013, 6:48 AM

## 2013-07-20 ENCOUNTER — Encounter (INDEPENDENT_AMBULATORY_CARE_PROVIDER_SITE_OTHER): Payer: Self-pay

## 2013-07-20 NOTE — Progress Notes (Addendum)
Received outside labs from [facility] dated:  06/09/2013    Estrogens = 84 pg/ml  Vit D, 25-hydroxy = 40.6 ng.ml  TSH = 3.330  T4 = 1.10  FSH = 12.2 mIU/ml  LH = 4.6 mIU/ml  CBC = 13.7/40.6 MCV = 88  PLT = 210  Vit B12 and Folate:  1365 pg/ml (elevated)/>19.9 ng/ml  TIBC = 356 ug/dL  Iron, Serum = 86 ug/dL  Iron saturation = 96%    Glu = 66  Metabolic panel - all WNL      Carman Ching, NP 07/20/2013, 9:41 AM

## 2013-08-03 ENCOUNTER — Ambulatory Visit (INDEPENDENT_AMBULATORY_CARE_PROVIDER_SITE_OTHER): Payer: Managed Care, Other (non HMO)

## 2013-11-19 ENCOUNTER — Ambulatory Visit (INDEPENDENT_AMBULATORY_CARE_PROVIDER_SITE_OTHER): Payer: Managed Care, Other (non HMO)

## 2013-12-13 ENCOUNTER — Ambulatory Visit (INDEPENDENT_AMBULATORY_CARE_PROVIDER_SITE_OTHER): Payer: Managed Care, Other (non HMO) | Admitting: Obstetrics & Gynecology

## 2013-12-29 ENCOUNTER — Encounter (INDEPENDENT_AMBULATORY_CARE_PROVIDER_SITE_OTHER): Payer: Self-pay | Admitting: Obstetrics & Gynecology

## 2013-12-29 ENCOUNTER — Ambulatory Visit (INDEPENDENT_AMBULATORY_CARE_PROVIDER_SITE_OTHER): Payer: Managed Care, Other (non HMO) | Admitting: Obstetrics & Gynecology

## 2013-12-29 VITALS — BP 122/60 | Ht 67.0 in | Wt 162.0 lb

## 2013-12-29 DIAGNOSIS — Z01419 Encounter for gynecological examination (general) (routine) without abnormal findings: Secondary | ICD-10-CM

## 2013-12-29 DIAGNOSIS — Z1239 Encounter for other screening for malignant neoplasm of breast: Secondary | ICD-10-CM

## 2013-12-29 NOTE — Progress Notes (Signed)
Subjective:     Patient ID:  Tracie PigeonDianne M Primeau is an 48 y.o. female   Chief Complaint:    Chief Complaint   Patient presents with    Gyn Exam       HPI  48 yo [phone] spouse s/p vasectomy. Has monthly menses, maybe missed one period this year.  Denies hot flashes, vaginal dryness.  Neg pain.  Abnormal pap 2001, all nl since.  H/o asymptomatic cystocele.    Past Medical History   Diagnosis Date    Chest pain 01/28/2008     work-up negative    Abnormal Pap smear 2001     LSIL    Unspecified breast disorder [id number] - present      large breast cysts     Dysplasia of cervix 2001     Colpo CIN 1    Psychosocial stressors 2013 - 2014     Acute neck pain 2014      muscle relaxant med    Dyspepsia 2014     Zantac med    Breast cyst      bilateral, multiple, variable sizes.     Irregular heart beat     Sjogren's disease      Past Surgical History   Procedure Laterality Date    Hx tonsillectomy  1974    Biopsy breast  10/12/03; 11/03/07,  04/28/2012      left breast aspirations x 2 - 2004 - 15 ml turbid and 2009 - 3 ml ; 04/28/2012 Right with rare calcifications.     Family History   Problem Relation Age of Onset    Heart Disease Paternal Grandfather     Hypertension Paternal Grandfather     Heart Disease Father     Hypertension Father     Uterine Fibroids Mother      Hysterectomy at age 50    Diabetes Paternal Grandmother     Anesth Problems Neg Hx     Bleeding Prob Neg Hx     Other Mother      and x2 maternal aunts with  breast cysts - non cancerous.     Healthy Mother     Other Sister      Menorrhagia with Ablation     Cancer Son      testicular    Leukemia Maternal Uncle     Breast Cancer Maternal Aunt      postmenopausal     History     Social History    Marital Status: Married     Spouse Name: Kendell Baneroy     Number of Children: 3    Years of Education: 12     Occupational History    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          sp:  Programmer, multimediaostal service spvr     Social History Main Topics    Smoking status:  Former Smoker -- 0.25 packs/day for 2 years     Types: Cigarettes     Quit date: 10/22/1991    Smokeless tobacco: Never Used    Alcohol Use: No    Drug Use: No    Sexual Activity: Yes     Partners: Male     Pharmacist, hospitalBirth Control/ Protection: Vasectomy     Other Topics Concern    Abuse/Domestic Violence No    Breast Self Exam Yes    Caffeine Concern No    Calcium Intake Adequate No    Computer Use  Yes    Exercise Concern No    Helmet Use Yes    Seat Belt Yes    Special Diet Yes     low sodium diet    Sunscreen Used No    Uses Gait Assitive Device (Cane, Environmental consultant, Etc) No    Right Hand Dominant Yes    Left Hand Dominant No    Ambidextrous No     Social History Narrative    No narrative on file     Current Outpatient Prescriptions   Medication Sig    aMILoride-hydrochlorothiazide (MODURETIC) 5-50 mg Oral Tablet Take 1 Tab by mouth Once a day    cholecalciferol, vitamin D3, 1,000 unit Oral Tablet Take 1,000 Units by mouth Once a day Patient takes 2000 IU nightly    cyanocobalamin (VITAMIN B12) 250 mcg Oral Tablet Take 250 mcg by mouth Four times daily with food    cycloSPORINE (RESTASIS) 0.05 % Ophthalmic Dropperette Instill 1 Drop into both eyes Every 12 hours    flecainide (TAMBOCOR) 50 mg Oral Tablet Take 50 mg by mouth Twice daily    Green Tea Leaf Extract (GREEN TEA) Oral Capsule Take by mouth    linaclotide (LINZESS) 145 mcg Oral Capsule Take 145 mcg by mouth Once a day Takes this 2 x a week    magnesium citrate (CITROMA) Oral Solution Take 296 mL by mouth One time Takes 2 x day and also takes epi burr prd daily    Multivitamin Cap Prenatal    naproxen (NAPROSYN) 500 mg Tab take by mouth. As needed    Pilocarpine HCl 7.5 mg Tab take by mouth. Sustitition for [facility] 7.5mg   Take one tab by mouth three times daily    Potassium 99 mg Tab take by mouth. 2 daily    Ranitidine HCl (ZANTAC) 300 mg Oral Tablet Take 300 mg by mouth Every evening    VIT E-TOCOTRIENOL GAM,ALPH,DEL ORAL Take by mouth  Patient takes 400 IU daily    Zinc 50 mg Tab take by mouth.      Allergies   Allergen Reactions    Tetracycline        Review of Systems   Cardiovascular: Positive for leg swelling.   Gastrointestinal: Positive for constipation.   Genitourinary:        Denies SUI   All other systems reviewed and are negative.      Objective:   Physical Exam  .  BP 122/60   Ht 1.702 m (5\' 7" )   Wt 73.483 kg (162 lb)   BMI 25.37 kg/m2   LMP 12/07/2013  HEENT unremarkable  Breasts:  Symmetrical, no nipple retraction, skin dimpling, no dominant masses, nipple discharge or lymphadenopathy bilaterally   abd soft NT, w/o organomegaly  EG w/o lesions  Cvx parous w/o lesions. Gr II cystocele noted, Gr I uterine prolapse  Corpus upper limits of normal size w/o palpable adnexal masses     Assessment & Plan:     48 yo [id number] with asymptomatic cystocele  Plan:  Annual mammogram. Discussed pelvic relaxation.  She does not feel like it is interfering with her life in any way and prefers to manage expectantly for now.  Calcium with D 500-600 mg bid. Monthly SBE.

## 2014-02-19 ENCOUNTER — Encounter (INDEPENDENT_AMBULATORY_CARE_PROVIDER_SITE_OTHER): Payer: Self-pay | Admitting: Obstetrics & Gynecology

## 2014-02-23 ENCOUNTER — Ambulatory Visit (HOSPITAL_BASED_OUTPATIENT_CLINIC_OR_DEPARTMENT_OTHER): Payer: Self-pay

## 2014-03-29 ENCOUNTER — Ambulatory Visit
Admission: RE | Admit: 2014-03-29 | Discharge: 2014-03-29 | Disposition: A | Discharge status: Home / Self Care | Payer: Managed Care, Other (non HMO) | Source: Ambulatory Visit | Attending: Obstetrics & Gynecology | Admitting: Obstetrics & Gynecology

## 2014-03-29 DIAGNOSIS — Z1231 Encounter for screening mammogram for malignant neoplasm of breast: Secondary | ICD-10-CM | POA: Insufficient documentation

## 2014-03-29 DIAGNOSIS — N63 Unspecified lump in unspecified breast: Secondary | ICD-10-CM | POA: Insufficient documentation

## 2014-03-29 DIAGNOSIS — Z1239 Encounter for other screening for malignant neoplasm of breast: Secondary | ICD-10-CM

## 2014-04-11 ENCOUNTER — Ambulatory Visit
Admission: RE | Admit: 2014-04-11 | Discharge: 2014-04-11 | Disposition: A | Discharge status: Home / Self Care | Payer: Managed Care, Other (non HMO) | Source: Ambulatory Visit | Attending: Surgical | Admitting: Surgical

## 2014-04-11 ENCOUNTER — Ambulatory Visit (HOSPITAL_BASED_OUTPATIENT_CLINIC_OR_DEPARTMENT_OTHER): Payer: Managed Care, Other (non HMO) | Admitting: Rheumatology

## 2014-04-11 ENCOUNTER — Encounter (INDEPENDENT_AMBULATORY_CARE_PROVIDER_SITE_OTHER): Payer: Self-pay | Admitting: Rheumatology

## 2014-04-11 VITALS — BP 118/67 | HR 59 | Temp 98.6°F | Resp 18 | Ht 66.54 in | Wt 160.1 lb

## 2014-04-11 DIAGNOSIS — M3500 Sicca syndrome, unspecified: Secondary | ICD-10-CM | POA: Insufficient documentation

## 2014-04-11 DIAGNOSIS — Z87891 Personal history of nicotine dependence: Secondary | ICD-10-CM | POA: Insufficient documentation

## 2014-04-11 DIAGNOSIS — E039 Hypothyroidism, unspecified: Secondary | ICD-10-CM | POA: Insufficient documentation

## 2014-04-11 DIAGNOSIS — M26609 Unspecified temporomandibular joint disorder, unspecified side: Secondary | ICD-10-CM | POA: Insufficient documentation

## 2014-04-11 DIAGNOSIS — Z8249 Family history of ischemic heart disease and other diseases of the circulatory system: Secondary | ICD-10-CM | POA: Insufficient documentation

## 2014-04-11 DIAGNOSIS — Z8043 Family history of malignant neoplasm of testis: Secondary | ICD-10-CM | POA: Insufficient documentation

## 2014-04-11 DIAGNOSIS — Z806 Family history of leukemia: Secondary | ICD-10-CM | POA: Insufficient documentation

## 2014-04-11 DIAGNOSIS — Z803 Family history of malignant neoplasm of breast: Secondary | ICD-10-CM | POA: Insufficient documentation

## 2014-04-11 DIAGNOSIS — Z833 Family history of diabetes mellitus: Secondary | ICD-10-CM | POA: Insufficient documentation

## 2014-04-11 DIAGNOSIS — M25569 Pain in unspecified knee: Secondary | ICD-10-CM | POA: Insufficient documentation

## 2014-04-11 LAB — COMPREHENSIVE METABOLIC PANEL, NON-FASTING
ALBUMIN: 4.1 g/dL (ref 3.5–5.0)
ALBUMIN: 4.1 g/dL (ref 3.5–5.0)
ALKALINE PHOSPHATASE: 73 U/L (ref <150)
ALT (SGPT): 25 U/L (ref <55)
ANION GAP: 8 mmol/L (ref 4–13)
AST (SGOT): 23 U/L (ref 8–41)
BILIRUBIN, TOTAL: 0.6 mg/dL (ref 0.3–1.3)
BUN/CREAT RATIO: 22 (ref 6–22)
BUN: 17 mg/dL (ref 8–25)
CALCIUM: 9.5 mg/dL (ref 8.5–10.4)
CARBON DIOXIDE: 29 mmol/L (ref 22–32)
CHLORIDE: 102 mmol/L (ref 96–111)
CREATININE: 0.78 mg/dL (ref 0.49–1.10)
ESTIMATED GLOMERULAR FILTRATION RATE: >59 ml/min/1.73m2 (ref >59)
GLUCOSE,NONFAST: 105 mg/dL (ref 65–139)
POTASSIUM: 3.4 mmol/L — ABNORMAL LOW (ref 3.5–5.1)
SODIUM: 139 mmol/L (ref 136–145)
TOTAL PROTEIN: 7.2 g/dL (ref 6.4–8.3)
TOTAL PROTEIN: 7.2 g/dL (ref 6.4–8.3)

## 2014-04-11 LAB — CBC/DIFF
BASOPHILS: 0 %
BASOS ABS: 0.029 10*3/uL (ref 0.000–0.200)
EOS ABS: 0.022 THOU/uL (ref 0.000–0.500)
EOSINOPHIL: 0 %
HCT: 41.7 % (ref 33.5–45.2)
HGB: 14.2 g/dL (ref 11.2–15.2)
LYMPHOCYTES: 20 %
LYMPHS ABS: 1.812 10*3/uL (ref 1.000–4.800)
MCH: 30.1 pg (ref 27.4–33.0)
MCHC: 34.1 g/dL (ref 32.5–35.8)
MCV: 88.3 fL (ref 78–100)
MONOCYTES: 5 %
MONOS ABS: 0.409 10*3/uL (ref 0.300–1.000)
MPV: 9.8 fL (ref 7.5–11.5)
PLATELET COUNT: 209 10*3/uL (ref 140–450)
PLATELET COUNT: 209 THOU/uL (ref 140–450)
PMN ABS: 6.641 10*3/uL (ref 1.500–7.700)
PMN'S: 75 %
RBC: 4.72 MIL/uL (ref 3.63–4.92)
RDW: 13.2 % (ref 12.0–15.0)
WBC: 8.9 10*3/uL (ref 3.5–11.0)

## 2014-04-11 LAB — RHEUMATOID FACTOR, SERUM: RHEUMATOID FACTOR, SERUM: <15 IU/mL (ref <30)

## 2014-04-11 LAB — SEDIMENTATION RATE: SEDIMENTATION RATE: 9 mm/h (ref 0–30)

## 2014-04-12 LAB — SS-A AND SS-B ANTIBODIES, IGG, SERUM
SS-A/RO ANTIBODIES, IGG, SERUM: >8 U — ABNORMAL HIGH
SS-B/LA ANTIBODIES, IGG, SERUM: 0.5 U

## 2014-04-12 NOTE — H&P (Addendum)
Requesting Physician: Royston CowperHawkins, Erin C, CFNP  History of Present Illness  Tracie PigeonDianne M [name] is a 48 y.o. female who presents with a chief complaint of New Patient   to clinic. Patient presents with previous diagnosis of sjogrens followed by Dr Lytle ButteKafka. Initially found by PCP then referred to kafka. Has had dry eyes and mouth for years. She reports her eye doctor feels her eye dryness if improving. She currently is following with Dr Jonetta OsgoodSally Dee for an immunity issue. She also has been following with [facility] for left foot trouble and right knee pain with Dr Lubertha SouthZervos and Dr Acquanetta Sitarmelio. She has had MRI left foot and Right knee. Had cortisone shot in knee with some improvement. Reports history of TMJ and over past month a right sided headache radiating into neck that feels like vice grip. Noticing fatigue recently. Although it might be contributed to hypothyroidism and recently started on armour thyroid.  Denies red, hot swollen joints, rashes, sores in mouth, difficulty swallowing, hair loss, dry eyes or mouth, symptoms of raynauds, fevers, nausea, vomiting, diarrhea, chest pain, or shortness of breath. Denies history of pleurisy, pericarditis, psoriasis, inflammatory eye or bowel disease.  Past History  Current Outpatient Prescriptions   Medication Sig   . aMILoride-hydrochlorothiazide (MODURETIC) 5-50 mg Oral Tablet Take 1 Tab by mouth Once a day   . Ascorbic Acid (VITAMIN C) 1,000 mg Oral Tablet Take 1,000 mg by mouth Once a day   . cholecalciferol, vitamin D3, 1,000 unit Oral Tablet Take 1,000 Units by mouth Once a day Patient takes 2000 IU nightly   . cyanocobalamin (VITAMIN B12) 250 mcg Oral Tablet Take 250 mcg by mouth Four times daily with food   . cycloSPORINE (RESTASIS) 0.05 % Ophthalmic Dropperette Instill 1 Drop into both eyes Every 12 hours   . flecainide (TAMBOCOR) 50 mg Oral Tablet Take 50 mg by mouth Twice daily   . Green Tea Leaf Extract (GREEN TEA) Oral Capsule Take by mouth   . magnesium citrate  (CITROMA) Oral Solution Take 296 mL by mouth One time Takes 2 x day and also takes epi burr prd daily   . Miscellaneous Medical Supply Misc by Does not apply route TRI Chromolean supplement   . Pilocarpine HCl 7.5 mg Tab Take by mouth Twice daily [facility]   Take one tab by mouth three times daily   . Potassium 99 mg Tab take by mouth. 2 daily   . prenatal vitamin-iron-folate Tablet Take 1 Tab by mouth Once a day   . VIT E-TOCOTRIENOL GAM,ALPH,DEL ORAL Take by mouth Patient takes 400 IU daily   . Zinc 50 mg Tab take by mouth.      Allergies   Allergen Reactions   . Tetracycline      Past Medical History   Diagnosis Date   . Chest pain 01/28/2008     work-up negative   . Abnormal Pap smear 2001     LSIL   . Unspecified breast disorder [id number] - present      large breast cysts    . Dysplasia of cervix 2001     Colpo CIN 1   . Psychosocial stressors 2013 - 2014    . Acute neck pain 2014      muscle relaxant med   . Dyspepsia 2014     Zantac med   . Breast cyst      bilateral, multiple, variable sizes.    . Irregular heart beat    . Sjogren's  disease      Past Surgical History   Procedure Laterality Date   . Hx tonsillectomy  1974   . Biopsy breast  10/12/03; 11/03/07,  04/28/2012      left breast aspirations x 2 - 2004 - 15 ml turbid and 2009 - 3 ml ; 04/28/2012 Right with rare calcifications.     Family History  Family History   Problem Relation Age of Onset   . Heart Disease Paternal Grandfather    . Hypertension Paternal Grandfather    . Heart Disease Father    . Hypertension Father    . Uterine Fibroids Mother      Hysterectomy at age 50   . Diabetes Paternal Grandmother    . Anesth Problems Neg Hx    . Bleeding Prob Neg Hx    . Other Mother      and x2 maternal aunts with  breast cysts - non cancerous.    Marland Kitchen. Healthy Mother    . Other Sister      Menorrhagia with Ablation    . Cancer Son      testicular   . Leukemia Maternal Uncle    . Breast Cancer Maternal Aunt      postmenopausal     Social  History  History     Social History   . Marital Status: Married     Spouse Name: Kendell Baneroy     Number of Children: 3   . Years of Education: 12     Occupational History   . 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   .       sp:  Postal service spvr     Social History Main Topics   . Smoking status: Former Smoker -- 0.25 packs/day for 2 years     Types: Cigarettes     Quit date: 10/22/1991   . Smokeless tobacco: Never Used   . Alcohol Use: No   . Drug Use: No   . Sexual Activity:     Partners: Male     Birth Control/ Protection: Vasectomy     Other Topics Concern   . Abuse/Domestic Violence No   . Breast Self Exam Yes   . Caffeine Concern No   . Calcium Intake Adequate No   . Computer Use Yes   . Exercise Concern No   . Helmet Use Yes   . Seat Belt Yes   . Special Diet Yes     low sodium diet   . Sunscreen Used No   . Uses Gait Assitive Device (Cane, Walker, Etc) No   . Right Hand Dominant Yes   . Left Hand Dominant No   . Ambidextrous No     Social History Narrative     Review of Systems  Other than ROS in the HPI, all other systems were negative.  Examination  Vitals: BP 118/67   Pulse 59   Temp(Src) 37 C (98.6 F) (Tympanic)   Resp 18   Ht 1.69 m (5' 6.53")   Wt 72.6 kg (160 lb 0.9 oz)   BMI 25.42 kg/m2     SpO2 100%   General: appears in good health, appears stated age and no distress  Eyes: Conjunctiva clear., Pupils equal and round.   HENT:Mouth without ulceration.  Neck: No thyromegaly or lymphadenopathy  Cardiovascular: Normal.  Lungs: clear to auscultation bilaterally.   Abdomen: soft, non-tender and bowel sounds normal  Extremities: no cyanosis or edema  Joints:  Full ROM and no synovitis of the upper or lower extremities  Skin: No rashes or lesions  Neurologic: Up to exam table without assistance, Proximal muscle strength 5, Grip strength  5, Grossly intact.  Lymphatics: no lymphadenopathy  Psychiatric: AOx3  Diagnosis and Plan  1. Sjogren's syndrome - previous diagnosis, following with Dr Lytle Butte. Will collect  those records to review. Today we will complete work up to further evaluate as well.      Follow up in 2 weeks to review results and records received.   Orders Placed This Encounter   . Ana   . Rheumatoid Factor   . SS-A AND SS-B ANTIBODIES, IGG, SERUM   . Sedimentation Rate   . Comprehensive Metabolic Panel, Nf   . Cbc/Diff     The patient was seen and examined by Dr Herby Abraham, who agrees with the above note. The plan of care was developed by Dr Herby Abraham.    [place], [place]    I personally saw and examined the patient. See mid-level's note which I have reviewed and agree with. I have confirmed the history of present illness, past medical history, social history, family history and review of systems. My findings are as follows:  Requesting Physician: Royston Cowper, CFNP  History of Present Illness  Tracie [name] is a 48 y.o. female who presents with a chief complaint of New Patient   to clinic.  Pt with dx sjogrens previously followed with Dr. Ovidio Hanger how dx made- no biopsy  Has long h/o dry eye and mouth  Seeing ortho for left foot and right knee pain  See an allergist  Past History  Current Outpatient Prescriptions   Medication Sig   . aMILoride-hydrochlorothiazide (MODURETIC) 5-50 mg Oral Tablet Take 1 Tab by mouth Once a day   . Ascorbic Acid (VITAMIN C) 1,000 mg Oral Tablet Take 1,000 mg by mouth Once a day   . cholecalciferol, vitamin D3, 1,000 unit Oral Tablet Take 1,000 Units by mouth Once a day Patient takes 2000 IU nightly   . cyanocobalamin (VITAMIN B12) 250 mcg Oral Tablet Take 250 mcg by mouth Four times daily with food   . cycloSPORINE (RESTASIS) 0.05 % Ophthalmic Dropperette Instill 1 Drop into both eyes Every 12 hours   . flecainide (TAMBOCOR) 50 mg Oral Tablet Take 50 mg by mouth Twice daily   . Green Tea Leaf Extract (GREEN TEA) Oral Capsule Take by mouth   . magnesium citrate (CITROMA) Oral Solution Take 296 mL by mouth One time Takes 2 x day and also takes epi burr prd  daily   . Miscellaneous Medical Supply Misc by Does not apply route TRI Chromolean supplement   . Pilocarpine HCl 7.5 mg Tab Take by mouth Twice daily Sustitition for [facility] 7.5mg   Take one tab by mouth three times daily   . Potassium 99 mg Tab take by mouth. 2 daily   . prenatal vitamin-iron-folate Tablet Take 1 Tab by mouth Once a day   . VIT E-TOCOTRIENOL GAM,ALPH,DEL ORAL Take by mouth Patient takes 400 IU daily   . Zinc 50 mg Tab take by mouth.      Allergies   Allergen Reactions   . Tetracycline      Past Medical History   Diagnosis Date   . Chest pain 01/28/2008     work-up negative   . Abnormal Pap smear 2001     LSIL   . Unspecified breast disorder  20004 - present      large breast cysts    . Dysplasia of cervix 2001     Colpo CIN 1   . Psychosocial stressors 2013 - 2014    . Acute neck pain 2014      muscle relaxant med   . Dyspepsia 2014     Zantac med   . Breast cyst      bilateral, multiple, variable sizes.    . Irregular heart beat    . Sjogren's disease      Past Surgical History   Procedure Laterality Date   . Hx tonsillectomy  1974   . Biopsy breast  10/12/03; 11/03/07,  04/28/2012      left breast aspirations x 2 - 2004 - 15 ml turbid and 2009 - 3 ml ; 04/28/2012 Right with rare calcifications.     Family History  Family History   Problem Relation Age of Onset   . Heart Disease Paternal Grandfather    . Hypertension Paternal Grandfather    . Heart Disease Father    . Hypertension Father    . Uterine Fibroids Mother      Hysterectomy at age 50   . Diabetes Paternal Grandmother    . Anesth Problems Neg Hx    . Bleeding Prob Neg Hx    . Other Mother      and x2 maternal aunts with  breast cysts - non cancerous.    Marland Kitchen Healthy Mother    . Other Sister      Menorrhagia with Ablation    . Cancer Son      testicular   . Leukemia Maternal Uncle    . Breast Cancer Maternal Aunt      postmenopausal     Social History  History     Social History   . Marital Status: Married     Spouse Name: Kendell Bane     Number of  Children: 3   . Years of Education: 12     Occupational History   . 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   .       sp:  Postal service spvr     Social History Main Topics   . Smoking status: Former Smoker -- 0.25 packs/day for 2 years     Types: Cigarettes     Quit date: 10/22/1991   . Smokeless tobacco: Never Used   . Alcohol Use: No   . Drug Use: No   . Sexual Activity:     Partners: Male     Birth Control/ Protection: Vasectomy     Other Topics Concern   . Abuse/Domestic Violence No   . Breast Self Exam Yes   . Caffeine Concern No   . Calcium Intake Adequate No   . Computer Use Yes   . Exercise Concern No   . Helmet Use Yes   . Seat Belt Yes   . Special Diet Yes     low sodium diet   . Sunscreen Used No   . Uses Gait Assitive Device (Cane, Walker, Etc) No   . Right Hand Dominant Yes   . Left Hand Dominant No   . Ambidextrous No     Social History Narrative     Review of Systems  Other than ROS in the HPI, all other review of systems were negative except for: as noted in Mrs. Weghorst's note  Examination  Vitals: BP 118/67   Pulse  59   Temp(Src) 37 C (98.6 F) (Tympanic)   Resp 18   Ht 1.69 m (5' 6.53")   Wt 72.6 kg (160 lb 0.9 oz)   BMI 25.42 kg/m2     SpO2 100%   General: no acute distress  Eyes: Conjunctiva clear.  HENT:Mouth without ulceration.  Neck: no adenopathy  Cardiovascular: RRR without murmur.  Lungs: clear to auscultation bilaterally.   Abdomen: bowel sounds normal  Extremities: no cyanosis or edema  Joints:  Full ROM and no synovitis of the upper or lower extremities  Skin: No rashes or lesions  Neurologic: Grossly intact.  Psychiatric: AOx3  Diagnosis and Plan  1. Prior dx of sjogrensSjogren's syndrome    Check labs to further evaluate  Get records  Orders Placed This Encounter   . Ana   . Rheumatoid Factor   . SS-A AND SS-B ANTIBODIES, IGG, SERUM   . Sedimentation Rate   . Comprehensive Metabolic Panel, Nf   . Cbc/Diff       Alwyn Pea, MD 04/13/2014, 09:44

## 2014-04-13 LAB — ANA TITER: ANA TITER: 1:320 {titer}

## 2014-04-13 LAB — ANA PATTERN

## 2014-04-13 LAB — HEP-2 SUBSTRATE ANTINUCLEAR ANTIBODIES (ANA), SERUM: ANTI-NUCLEAR AB: POSITIVE — AB

## 2014-04-25 ENCOUNTER — Encounter (INDEPENDENT_AMBULATORY_CARE_PROVIDER_SITE_OTHER): Payer: Managed Care, Other (non HMO) | Admitting: Rheumatology

## 2014-05-02 ENCOUNTER — Ambulatory Visit: Payer: Managed Care, Other (non HMO) | Attending: Rheumatology | Admitting: Rheumatology

## 2014-05-02 ENCOUNTER — Encounter (INDEPENDENT_AMBULATORY_CARE_PROVIDER_SITE_OTHER): Payer: Self-pay | Admitting: Rheumatology

## 2014-05-02 VITALS — BP 130/75 | HR 61 | Temp 96.9°F | Ht 67.0 in | Wt 159.0 lb

## 2014-05-02 DIAGNOSIS — M3500 Sicca syndrome, unspecified: Secondary | ICD-10-CM | POA: Insufficient documentation

## 2014-05-02 DIAGNOSIS — R5383 Other fatigue: Secondary | ICD-10-CM | POA: Insufficient documentation

## 2014-05-02 DIAGNOSIS — R5381 Other malaise: Secondary | ICD-10-CM | POA: Insufficient documentation

## 2014-05-02 DIAGNOSIS — K5900 Constipation, unspecified: Secondary | ICD-10-CM | POA: Insufficient documentation

## 2014-05-02 DIAGNOSIS — R1310 Dysphagia, unspecified: Secondary | ICD-10-CM | POA: Insufficient documentation

## 2014-05-02 DIAGNOSIS — K117 Disturbances of salivary secretion: Secondary | ICD-10-CM | POA: Insufficient documentation

## 2014-05-02 DIAGNOSIS — D849 Immunodeficiency, unspecified: Secondary | ICD-10-CM | POA: Insufficient documentation

## 2014-05-02 DIAGNOSIS — H04129 Dry eye syndrome of unspecified lacrimal gland: Secondary | ICD-10-CM | POA: Insufficient documentation

## 2014-05-02 MED ORDER — PILOCARPINE 7.5 MG TABLET
7.5000 mg | ORAL_TABLET | Freq: Three times a day (TID) | ORAL | Status: DC
Start: 2014-05-02 — End: 2014-05-06

## 2014-05-02 MED ORDER — PILOCARPINE 7.5 MG TABLET
15.0000 mg | ORAL_TABLET | Freq: Two times a day (BID) | ORAL | Status: DC
Start: 2014-05-02 — End: 2014-05-02

## 2014-05-02 NOTE — Progress Notes (Addendum)
Sjogren's Disease    Subjective:      Tracie [name] is an 48 y.o. Caucasian female who was seen in the clinic for follow-up for sjogren syndrome.  She says her dry eyes and mouth are controlled on Pilocarpine and RESTASIS.    Continues to be tired even though she gets 8-9 hours sleep daily.  Endorse some difficulty swallowing and constipation. No odynophagia, melena, nausea, vomiting.      Previous Report(s) Reviewed: office notes, lab reports, historical medical records       Current Outpatient Prescriptions   Medication Sig Dispense Refill   . aMILoride-hydrochlorothiazide (MODURETIC) 5-50 mg Oral Tablet Take 1 Tab by mouth Once a day       . Ascorbic Acid (VITAMIN C) 1,000 mg Oral Tablet Take 1,000 mg by mouth Once a day       . cholecalciferol, vitamin D3, 1,000 unit Oral Tablet Take 1,000 Units by mouth Once a day Patient takes 2000 IU nightly       . cyanocobalamin (VITAMIN B12) 250 mcg Oral Tablet Take 250 mcg by mouth Four times daily with food       . cycloSPORINE (RESTASIS) 0.05 % Ophthalmic Dropperette Instill 1 Drop into both eyes Every 12 hours       . flecainide (TAMBOCOR) 50 mg Oral Tablet Take 50 mg by mouth Twice daily       . Green Tea Leaf Extract (GREEN TEA) Oral Capsule Take by mouth       . magnesium citrate (CITROMA) Oral Solution Take 296 mL by mouth One time Takes 2 x day and also takes epi burr prd daily       . Miscellaneous Medical Supply Misc by Does not apply route TRI Chromolean supplement       . Pilocarpine HCl 7.5 mg Oral Tablet Take 1 Tab (7.5 mg total) by mouth Three times a day for 90 days Sustitition for [facility] 7.5mg   Take one tab by mouth three times daily  120 Tab  4   . Potassium 99 mg Tab take by mouth. 2 daily       . prenatal vitamin-iron-folate Tablet Take 1 Tab by mouth Once a day       . thyroid (ARMOUR THYROID) 60 mg Oral Tablet Take 60 mg by mouth Once a day       . VIT E-TOCOTRIENOL GAM,ALPH,DEL ORAL Take by mouth Patient takes 400 IU daily       . Zinc 50 mg Tab  take by mouth.          No current facility-administered medications for this visit.     Allergies   Allergen Reactions   . Tetracycline        Review of Systems    Constitutional: positive for fatigue, negative for fevers, chills, sweats, anorexia and weight loss  Eyes: positive for irritation, negative for visual disturbance and color blindness  Ears, nose, mouth, throat, and face: positive for mouth dryness, negative for hearing loss, tinnitus, ear drainage, earaches, nasal congestion, epistaxis, sore throat, hoarseness, voice change and facial trauma  Respiratory: negative for cough, sputum, hemoptysis, pleurisy/chest pain, pneumonia, asthma, wheezing or dyspnea on exertion  Cardiovascular: negative for chest pain, chest pressure/discomfort, dyspnea, palpitations, irregular heart beats, near-syncope and syncope  Gastrointestinal: positive for dysphagia and constipation, negative for odynophagia, nausea, vomiting, melena, abdominal pain and jaundice  Genitourinary:negative for frequency, dysuria, nocturia, urinary incontinence, hesitancy and hematuria  Hematologic/lymphatic: negative for easy bruising and bleeding  Musculoskeletal:negative for myalgias, arthralgias, stiff joints, neck pain, back pain and muscle weakness  Neurological: negative for headaches, dizziness and paresthesia  Behavioral/Psych: positive for good mood  Endocrine: negative       Objective:     BP 130/75   Pulse 61   Temp(Src) 36.1 C (96.9 F) (Tympanic)   Ht 1.702 m (5\' 7" )   Wt 72.1 kg (158 lb 15.2 oz)   BMI 24.89 kg/m2     SpO2 99%   General appearance: alert, cooperative, no distress, appears stated age  Head: Normocephalic, without obvious abnormality, atraumatic  Eyes: conjunctivae/corneas clear. PERRL, EOM's intact.  Ears: normal  pinna  Nose: Nares normal. Septum midline. Mucosa normal. No drainage or sinus tenderness.  Throat: Lips, mucosa, and tongue normal. Teeth and gums normal  Lungs: clear to auscultation  bilaterally  Heart: regular rate and rhythm, S1, S2 normal,   Abdomen: soft, non-tender. Bowel sounds normal.   Extremities: extremities normal, atraumatic, no cyanosis or edema    Lab Review    Hospital Outpatient Visit on 04/11/2014   Component Date Value Range Status   . ANTI-NUCLEAR AB 04/11/2014 POSITIVE* NEGATIVE Final   . RHEUMATOID FACTOR, SERUM 04/11/2014 <15  <30 IU/mL Final   . SS-A/RO ANTIBODIES, IGG, SERUM 04/11/2014 >8.0*  Final    Comment: Interpretation: Positive (>=1.0)  Reference range: <1.0 (Negative)     . SS-B/LA ANTIBODIES, IGG, SERUM 04/11/2014 0.5   Final   . SEDIMENTATION RATE 04/11/2014 9  0 - 30 mm/hr Final   . ALBUMIN 04/11/2014 4.1  3.5 - 5.0 g/dL Final   . BILIRUBIN, TOTAL 04/11/2014 0.6  0.3 - 1.3 mg/dL Final   . CALCIUM 16/10/960406/22/2015 9.5  8.5 - 10.4 mg/dL Final   . CHLORIDE 54/09/811906/22/2015 102  96 - 111 mmol/L Final   . CREATININE 04/11/2014 0.78  0.49 - 1.10 mg/dL Final   . ESTIMATED GLOMERULAR FILTRATION RA* 04/11/2014 >59  >59 ml/min/1.273m2 Final   . GLUCOSE,NONFAST 04/11/2014 105  65 - 139 mg/dL Final   . ALKALINE PHOSPHATASE 04/11/2014 73  <150 U/L Final   . POTASSIUM 04/11/2014 3.4* 3.5 - 5.1 mmol/L Final   . TOTAL PROTEIN 04/11/2014 7.2  6.4 - 8.3 g/dL Final   . SODIUM 14/78/295606/22/2015 139  136 - 145 mmol/L Final   . AST (SGOT) 04/11/2014 23  8 - 41 U/L Final   . BUN 04/11/2014 17  8 - 25 mg/dL Final   . BUN/CREAT RATIO 04/11/2014 22  6 - 22 Final   . CARBON DIOXIDE 04/11/2014 29  22 - 32 mmol/L Final   . ANION GAP 04/11/2014 8  4 - 13 mmol/L Final   . ALT (SGPT) 04/11/2014 25  <55 U/L Final   . WBC 04/11/2014 8.9  3.5 - 11.0 THOU/uL Final   . RBC 04/11/2014 4.72  3.63 - 4.92 MIL/uL Final   . HGB 04/11/2014 14.2  11.2 - 15.2 g/dL Final   . HCT 21/30/865706/22/2015 41.7  33.5 - 45.2 % Final   . PLATELET COUNT 04/11/2014 209  140 - 450 THOU/uL Final         Assessment:   This is a 48 yo F with history significant for common variable immunodeficiency here for  follow up for Sjogren syndrome. No acute  changes. Controlled on pilocarpine and RESTASIS.       Plan:     Medications:continue present medication  Restasis drops for eyes - managed by ophthalmologist   Pilocarpine 7.5 mg, 1 tablet  oral 3 X daily. [facility]  Patient literature on Sjogrens from [facility] given    Follow-up In 6 months.  Call if worsening or not improving.    Cindee Lame, MD  05/02/2014, 19:14    Patient seen, and discussed with Dr. Tenna Child who agrees with the above plan      I saw and examined the patient.  I reviewed the resident's note.  I agree with the findings and plan of care as documented in the resident's note.  Any exceptions/additions are edited/noted.    Alwyn Pea, MD 05/03/2014, 13:18

## 2014-05-03 NOTE — Addendum Note (Signed)
Addended by: Lendon KaHORNSBY, Verlie Liotta ANN on: 05/03/2014 01:18 PM     Modules accepted: Level of Service

## 2014-05-06 ENCOUNTER — Ambulatory Visit (INDEPENDENT_AMBULATORY_CARE_PROVIDER_SITE_OTHER): Payer: Self-pay | Admitting: Rheumatology

## 2014-05-06 DIAGNOSIS — M3500 Sicca syndrome, unspecified: Secondary | ICD-10-CM

## 2014-05-06 MED ORDER — PILOCARPINE 7.5 MG TABLET
7.5000 mg | ORAL_TABLET | Freq: Three times a day (TID) | ORAL | Status: DC
Start: 2014-05-06 — End: 2014-05-10

## 2014-05-06 NOTE — Telephone Encounter (Signed)
-----   Message from Duaine DredgeMargie F Barnhart, RN sent at 05/05/2014  1:07 PM EDT -----  Regarding: FW: RX Clarification  >> MATTHEW TROUT 05/05/2014 12:01 PM  Cindee LameFogha, Evan P, MD    Pilocarpine HCl 7.5 mg Oral Tablet  - 120 Tab   Sig - Route: Take 1 Tab (7.5 mg total) by mouth Three times a day for 90 days [facility] 7.5[facility]  Take one tab by mouth three times daily - Oral    Requesting authorization for a 90 day supply.  Please call the pharmacy to advise at [phone]  Reference # [id number]     [facility] [place] [address]    [address] [place] [id number]    Phone: [phone] Fax: [phone]    Open 24 Hours?: No

## 2014-05-06 NOTE — Telephone Encounter (Signed)
New rx sent for 90 supply

## 2014-05-10 MED ORDER — PILOCARPINE 7.5 MG TABLET
7.5000 mg | ORAL_TABLET | Freq: Three times a day (TID) | ORAL | Status: AC
Start: 2014-05-10 — End: 2014-08-08

## 2014-05-10 NOTE — Addendum Note (Signed)
Addended by: Jannet MantisMASARONE, Winfred Redel on: 05/10/2014 11:09 AM     Modules accepted: Orders

## 2014-05-10 NOTE — Addendum Note (Signed)
Addended by: Berta MinorWEGHORST, Tymeshia Awan JO on: 05/10/2014 01:04 PM     Modules accepted: Orders

## 2014-06-08 ENCOUNTER — Encounter (INDEPENDENT_AMBULATORY_CARE_PROVIDER_SITE_OTHER): Payer: Self-pay | Admitting: Obstetrics & Gynecology

## 2014-06-08 ENCOUNTER — Ambulatory Visit (INDEPENDENT_AMBULATORY_CARE_PROVIDER_SITE_OTHER): Payer: Managed Care, Other (non HMO) | Admitting: Obstetrics & Gynecology

## 2014-06-08 VITALS — BP 130/78 | Ht 67.0 in | Wt 156.4 lb

## 2014-06-08 DIAGNOSIS — N926 Irregular menstruation, unspecified: Principal | ICD-10-CM

## 2014-06-08 MED ORDER — MEDROXYPROGESTERONE 10 MG TABLET
10.0000 mg | ORAL_TABLET | Freq: Every day | ORAL | Status: DC
Start: 2014-06-08 — End: 2016-12-11

## 2014-06-08 NOTE — Progress Notes (Signed)
Subjective:     Patient ID:  Tracie PigeonDianne M [name] is an 48 y.o. female   Chief Complaint:    Chief Complaint   Patient presents with    Menstrual Problem       HPI 48 yo [phone] currently having light bleeding for past 2-3 wks and is tired of it. Menses have become increasingly irregular over the past several months but never this prolonged. Pelvic u/s and embx 9/14 was normal.  Recently started Cytomel.  Tried 2 other thyroid meds and couldn't tolerate it.  C/o hot flashes, has vaginal dryness due to Sjogrens. C/o  occ transient  pelvic pain like menstrual cramps. Colonoscopy last year, repeat procedure due in 9 years.  Pap Jan 2014 was neg/neg HRHPV.  I have reviewed and updated as appropriate the past medical, family and social history.      Review of Systemshot flashes, cold intolerance  Objective:   Physical Exam   BP 130/78 mmHg   Ht 1.702 m (5\' 7" )   Wt 70.943 kg (156 lb 6.4 oz)   BMI 24.49 kg/m2  HEENT unremarkable  Breasts:  Symmetrical, no nipple retraction, skin dimpling, no dominant masses, nipple discharge or lymphadenopathy bilaterally   abd soft NT, w/o organomegaly  EG w/o lesions  Cvx parous w/o lesions, Gr II-III cystocele  Corpus NSSAP w/o palpable adnexal masses    Ortho Exam    Assessment & Plan:   48 yo [id number] perimenopausal, with recent onset of thyroid disease.  Plan:  Explained that is is difficult to determine whether her bleeding pattern is due to perimenopause to her recent diagnosis of thyroid disease.  Pelvic u/s ordered but out computer system went down in the office so she will return for that.  Gave Provera 10 x 10 to stop current menses.  F/u prn.

## 2014-06-13 ENCOUNTER — Other Ambulatory Visit (INDEPENDENT_AMBULATORY_CARE_PROVIDER_SITE_OTHER): Payer: Managed Care, Other (non HMO)

## 2014-06-13 DIAGNOSIS — N926 Irregular menstruation, unspecified: Secondary | ICD-10-CM

## 2014-11-02 ENCOUNTER — Encounter (INDEPENDENT_AMBULATORY_CARE_PROVIDER_SITE_OTHER): Payer: Managed Care, Other (non HMO) | Admitting: Rheumatology

## 2014-12-12 ENCOUNTER — Ambulatory Visit (INDEPENDENT_AMBULATORY_CARE_PROVIDER_SITE_OTHER): Payer: Managed Care, Other (non HMO) | Admitting: GENERAL SURGERY

## 2014-12-26 ENCOUNTER — Ambulatory Visit (INDEPENDENT_AMBULATORY_CARE_PROVIDER_SITE_OTHER): Payer: Managed Care, Other (non HMO) | Admitting: GENERAL SURGERY

## 2015-02-21 ENCOUNTER — Ambulatory Visit
Admission: RE | Admit: 2015-02-21 | Discharge: 2015-02-21 | Disposition: A | Discharge status: Home / Self Care | Payer: Managed Care, Other (non HMO) | Source: Ambulatory Visit | Attending: Rheumatology | Admitting: Rheumatology

## 2015-02-21 ENCOUNTER — Ambulatory Visit (HOSPITAL_BASED_OUTPATIENT_CLINIC_OR_DEPARTMENT_OTHER): Payer: Managed Care, Other (non HMO) | Admitting: Rheumatology

## 2015-02-21 ENCOUNTER — Encounter (INDEPENDENT_AMBULATORY_CARE_PROVIDER_SITE_OTHER): Payer: Self-pay | Admitting: Rheumatology

## 2015-02-21 VITALS — BP 116/72 | HR 74 | Temp 97.8°F | Ht 67.01 in | Wt 153.4 lb

## 2015-02-21 DIAGNOSIS — M3500 Sicca syndrome, unspecified: Secondary | ICD-10-CM

## 2015-02-21 DIAGNOSIS — D839 Common variable immunodeficiency, unspecified: Secondary | ICD-10-CM | POA: Insufficient documentation

## 2015-02-21 LAB — COMPREHENSIVE METABOLIC PNL, FASTING
ALBUMIN: 4.1 g/dL (ref 3.5–5.0)
ALKALINE PHOSPHATASE: 69 U/L (ref <150)
ALT (SGPT): 33 U/L (ref <55)
ALT (SGPT): 33 U/L (ref <55)
BILIRUBIN, TOTAL: 0.5 mg/dL (ref 0.3–1.3)
BUN/CREAT RATIO: 23 — ABNORMAL HIGH (ref 6–22)
BUN: 18 mg/dL (ref 8–25)
CALCIUM: 9.6 mg/dL (ref 8.5–10.4)
CARBON DIOXIDE: 30 mmol/L (ref 22–32)
CREATININE: 0.77 mg/dL (ref 0.49–1.10)
POTASSIUM: 3.3 mmol/L — ABNORMAL LOW (ref 3.5–5.1)
TOTAL PROTEIN: 7.2 g/dL (ref 6.4–8.3)
TOTAL PROTEIN: 7.2 g/dL (ref 6.4–8.3)

## 2015-02-21 LAB — CBC/DIFF
BASOPHILS: 0 %
BASOS ABS: 0.02 10*3/uL (ref 0.000–0.200)
EOS ABS: 0.045 THOU/uL (ref 0.000–0.500)
HCT: 43.6 % (ref 33.5–45.2)
HGB: 14.6 g/dL (ref 11.2–15.2)
HGB: 14.6 g/dL (ref 11.2–15.2)
LYMPHOCYTES: 20 %
LYMPHS ABS: 1.167 10*3/uL (ref 1.000–4.800)
MCH: 29.5 pg (ref 27.4–33.0)
MCH: 29.5 pg (ref 27.4–33.0)
MCV: 88 fL (ref 78–100)
MONOCYTES: 7 %
MPV: 10 fL (ref 7.5–11.5)
PLATELET COUNT: 151 THOU/uL (ref 140–450)
PMN ABS: 4.156 THOU/uL (ref 1.500–7.700)
PMN'S: 72 %
RBC: 4.95 MIL/uL — ABNORMAL HIGH (ref 3.63–4.92)
RDW: 13.4 % (ref 12.0–15.0)

## 2015-02-21 MED ORDER — PILOCARPINE 7.5 MG TABLET
7.5000 mg | ORAL_TABLET | Freq: Two times a day (BID) | ORAL | Status: DC
Start: 2015-02-21 — End: 2015-06-09

## 2015-02-21 NOTE — Progress Notes (Addendum)
No chief complaint on file.    Subjective:      Tracie [name] is an 49 y.o. Caucasian female who was seen in the clinic for follow-up for sjogren syndrome.  She says her dry eyes and mouth are controlled on Pilocarpine and Restasis.   She is requesting refills of her salagine.  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  Her immunogloblin levels have risen and she is followed closely by Dr. Geraldine Contras, Allergist, in [place]  Denies fever, eye pain, mouth pain, sob, dysphagia, constipation or increased swelling of her extremities    Previous Report(s) Reviewed: office notes, lab reports, historical medical records       Current Outpatient Prescriptions   Medication Sig Dispense Refill   . aMILoride-hydrochlorothiazide (MODURETIC) 5-50 mg Oral Tablet Take 1 Tab by mouth Once a day     . Ascorbic Acid (VITAMIN C) 1,000 mg Oral Tablet Take 1,000 mg by mouth Once a day     . cholecalciferol, vitamin D3, 1,000 unit Oral Tablet Take 1,000 Units by mouth Once a day Patient takes 2000 IU nightly     . cyanocobalamin (VITAMIN B12) 250 mcg Oral Tablet Take 250 mcg by mouth Four times daily with food     . cycloSPORINE (RESTASIS) 0.05 % Ophthalmic Dropperette Instill 1 Drop into both eyes Every 12 hours     . flecainide (TAMBOCOR) 50 mg Oral Tablet Take 50 mg by mouth Twice daily     . Green Tea Leaf Extract (GREEN TEA) Oral Capsule Take by mouth     . magnesium citrate (CITROMA) Oral Solution Take 296 mL by mouth One time Takes 2 x day and also takes epi burr prd daily     . medroxyPROGESTERone (PROVERA) 10 mg Oral Tablet Take 1 Tab (10 mg total) by mouth Once a day 10 Tab 0   . Miscellaneous Medical Supply Misc by Does not apply route TRI Chromolean supplement     . Pilocarpine HCl 7.5 mg Oral Tablet Take 7.5 mg by mouth Twice daily     . Potassium 99 mg Tab take by mouth. 2 daily     . prenatal  vitamin-iron-folate Tablet Take 1 Tab by mouth Once a day     . thyroid (ARMOUR THYROID) 60 mg Oral Tablet Take 60 mg by mouth Once a day     . VIT E-TOCOTRIENOL GAM,ALPH,DEL ORAL Take by mouth Patient takes 400 IU daily     . Zinc 50 mg Tab take by mouth.        No current facility-administered medications for this visit.     Allergies   Allergen Reactions   . Tetracycline        Review of Systems    Denies fever, eye pain, mouth pain, sob, dysphagia, constipation, joint pain,  or increased swelling of her extremities       Objective:     BP 116/72 mmHg  Pulse 74  Temp(Src) 36.6 C (97.8 F) (Tympanic)  Ht 1.702 m (5' 7.01")  Wt 69.6 kg (153 lb 7 oz)  BMI 24.03 kg/m2  SpO2 99%  General appearance: alert, cooperative, no distress, appears stated age  Head: Normocephalic, without obvious abnormality, atraumatic  Eyes: conjunctivae/corneas clear. PERRL, EOM's intact.  Ears: normal  pinna  Nose: Nares normal. Septum midline. Mucosa normal. No drainage or sinus tenderness.  Throat: Lips, mucosa, and  tongue normal. Teeth and gums normal  Lungs: clear to auscultation bilaterally  Heart: regular rate and rhythm, S1, S2 normal,   Abdomen: soft, non-tender. Bowel sounds normal.   Extremities: extremities normal, atraumatic, no cyanosis or edema    Lab Review    Hospital Outpatient Visit on 04/11/2014   Component Date Value Range Status   . ANTI-NUCLEAR AB 04/11/2014 POSITIVE* NEGATIVE Final   . RHEUMATOID FACTOR, SERUM 04/11/2014 <15  <30 IU/mL Final   . SS-A/RO ANTIBODIES, IGG, SERUM 04/11/2014 >8.0*  Final    Comment: Interpretation: Positive (>=1.0)  Reference range: <1.0 (Negative)     . SS-B/LA ANTIBODIES, IGG, SERUM 04/11/2014 0.5   Final   . SEDIMENTATION RATE 04/11/2014 9  0 - 30 mm/hr Final   . ALBUMIN 04/11/2014 4.1  3.5 - 5.0 g/dL Final   . BILIRUBIN, TOTAL 04/11/2014 0.6  0.3 - 1.3 mg/dL Final   . CALCIUM 54/09/811906/22/2015 9.5  8.5 - 10.4 mg/dL Final   . CHLORIDE 14/78/295606/22/2015 102  96 - 111 mmol/L Final   .  CREATININE 04/11/2014 0.78  0.49 - 1.10 mg/dL Final   . ESTIMATED GLOMERULAR FILTRATION RA* 04/11/2014 >59  >59 ml/min/1.9373m2 Final   . GLUCOSE,NONFAST 04/11/2014 105  65 - 139 mg/dL Final   . ALKALINE PHOSPHATASE 04/11/2014 73  <150 U/L Final   . POTASSIUM 04/11/2014 3.4* 3.5 - 5.1 mmol/L Final   . TOTAL PROTEIN 04/11/2014 7.2  6.4 - 8.3 g/dL Final   . SODIUM 21/30/865706/22/2015 139  136 - 145 mmol/L Final   . AST (SGOT) 04/11/2014 23  8 - 41 U/L Final   . BUN 04/11/2014 17  8 - 25 mg/dL Final   . BUN/CREAT RATIO 04/11/2014 22  6 - 22 Final   . CARBON DIOXIDE 04/11/2014 29  22 - 32 mmol/L Final   . ANION GAP 04/11/2014 8  4 - 13 mmol/L Final   . ALT (SGPT) 04/11/2014 25  <55 U/L Final   . WBC 04/11/2014 8.9  3.5 - 11.0 THOU/uL Final   . RBC 04/11/2014 4.72  3.63 - 4.92 MIL/uL Final   . HGB 04/11/2014 14.2  11.2 - 15.2 g/dL Final   . HCT 84/69/629506/22/2015 41.7  33.5 - 45.2 % Final   . PLATELET COUNT 04/11/2014 209  140 - 450 THOU/uL Final         Assessment:   This is a 49 yo F with history significant for common variable immunodeficiency here for  follow up for Sjogren syndrome. No acute changes.       Plan:       -Restasis drops for eyes - managed by ophthalmologist   -Pilocarpine 7.5 mg, 1 tablet oral 3 X daily. [facility]  -Continue following with opthalmology and dentist visit  -Discussed her dual diagnosis of Sjorgen's and CVID and educated patient on etiology, treatment and management   -Ordered CBC, BMP, sed rate  -F/u in 1 year        I saw and examined the patient.  I reviewed the resident's note.  I agree with the findings and plan of care as documented in the resident's note.  Any exceptions/additions are edited/noted.    Alwyn PeaJo Ann Allen Jaymon Dudek, MD 02/21/2015, 10:13

## 2015-02-28 ENCOUNTER — Encounter (HOSPITAL_BASED_OUTPATIENT_CLINIC_OR_DEPARTMENT_OTHER): Payer: Self-pay

## 2015-03-01 ENCOUNTER — Encounter (INDEPENDENT_AMBULATORY_CARE_PROVIDER_SITE_OTHER): Payer: Self-pay | Admitting: Cardiovascular Disease

## 2015-03-03 NOTE — Addendum Note (Signed)
Addended by: Lendon KaHORNSBY, Ranetta Armacost ANN on: 03/03/2015 01:13 PM     Modules accepted: Level of Service

## 2015-05-01 ENCOUNTER — Encounter (HOSPITAL_BASED_OUTPATIENT_CLINIC_OR_DEPARTMENT_OTHER): Payer: Self-pay

## 2015-06-09 ENCOUNTER — Other Ambulatory Visit (INDEPENDENT_AMBULATORY_CARE_PROVIDER_SITE_OTHER): Payer: Self-pay | Admitting: Rheumatology

## 2015-06-09 MED ORDER — PILOCARPINE 7.5 MG TABLET
7.5000 mg | ORAL_TABLET | Freq: Two times a day (BID) | ORAL | Status: DC
Start: 2015-06-09 — End: 2017-02-04

## 2015-06-09 NOTE — Telephone Encounter (Signed)
-----   Message from Sander Nephew sent at 06/08/2015 11:38 AM EDT -----  Regarding: refill  >> Tracie [name] 06/08/2015 11:38 AM  Doctor Name:  Dr. Tenna Child:     Date of last appointment: 02/21/15    Next scheduled visit: none    Medication Requested: Pilocarpine 7.5 mg Bid. 90 day supply    Preferred Pharmacy: Preferred Pharmacy     [facility] - [place], [place] - [address]    [address] [place] [address]    Phone: [phone] Fax: [phone]    Open 24 Hours?: No      Notes for Nurse or Physician: I explained to the patient that our office asks up to 72 hours to respond to messages.

## 2015-06-15 DIAGNOSIS — M542 Cervicalgia: Secondary | ICD-10-CM

## 2015-06-15 DIAGNOSIS — J0110 Acute frontal sinusitis, unspecified: Secondary | ICD-10-CM

## 2015-06-15 DIAGNOSIS — H1013 Acute atopic conjunctivitis, bilateral: Secondary | ICD-10-CM

## 2015-06-15 DIAGNOSIS — F419 Anxiety disorder, unspecified: Secondary | ICD-10-CM

## 2015-06-15 DIAGNOSIS — K5909 Other constipation: Secondary | ICD-10-CM

## 2015-08-21 ENCOUNTER — Other Ambulatory Visit (INDEPENDENT_AMBULATORY_CARE_PROVIDER_SITE_OTHER): Payer: Self-pay | Admitting: Family

## 2015-08-21 DIAGNOSIS — D803 Selective deficiency of immunoglobulin G [IgG] subclasses: Secondary | ICD-10-CM

## 2015-08-21 DIAGNOSIS — E559 Vitamin D deficiency, unspecified: Secondary | ICD-10-CM

## 2015-08-21 DIAGNOSIS — R8299 Other abnormal findings in urine: Secondary | ICD-10-CM

## 2015-08-21 DIAGNOSIS — M3500 Sicca syndrome, unspecified: Secondary | ICD-10-CM

## 2015-08-21 DIAGNOSIS — E039 Hypothyroidism, unspecified: Secondary | ICD-10-CM

## 2015-08-21 DIAGNOSIS — J3089 Other allergic rhinitis: Secondary | ICD-10-CM

## 2015-08-21 DIAGNOSIS — Z1239 Encounter for other screening for malignant neoplasm of breast: Secondary | ICD-10-CM

## 2015-08-21 DIAGNOSIS — N810 Urethrocele: Secondary | ICD-10-CM

## 2015-08-21 DIAGNOSIS — Z1231 Encounter for screening mammogram for malignant neoplasm of breast: Secondary | ICD-10-CM

## 2015-09-28 DIAGNOSIS — M7071 Other bursitis of hip, right hip: Secondary | ICD-10-CM

## 2015-10-04 ENCOUNTER — Ambulatory Visit
Admission: RE | Admit: 2015-10-04 | Discharge: 2015-10-04 | Disposition: A | Discharge status: Home / Self Care | Payer: Managed Care, Other (non HMO) | Source: Ambulatory Visit | Attending: Family | Admitting: Family

## 2015-10-04 ENCOUNTER — Encounter (HOSPITAL_BASED_OUTPATIENT_CLINIC_OR_DEPARTMENT_OTHER): Payer: Self-pay

## 2015-10-04 DIAGNOSIS — Z711 Person with feared health complaint in whom no diagnosis is made: Secondary | ICD-10-CM | POA: Insufficient documentation

## 2015-10-04 DIAGNOSIS — Z1231 Encounter for screening mammogram for malignant neoplasm of breast: Secondary | ICD-10-CM

## 2015-10-05 ENCOUNTER — Other Ambulatory Visit (INDEPENDENT_AMBULATORY_CARE_PROVIDER_SITE_OTHER): Payer: Self-pay | Admitting: Family

## 2015-10-05 DIAGNOSIS — N63 Unspecified lump in unspecified breast: Secondary | ICD-10-CM

## 2015-10-27 ENCOUNTER — Other Ambulatory Visit (HOSPITAL_COMMUNITY): Payer: Self-pay

## 2015-11-01 DIAGNOSIS — M2170 Unequal limb length (acquired), unspecified site: Secondary | ICD-10-CM

## 2015-11-01 DIAGNOSIS — M7121 Synovial cyst of popliteal space [Baker], right knee: Secondary | ICD-10-CM

## 2015-11-01 DIAGNOSIS — N390 Urinary tract infection, site not specified: Secondary | ICD-10-CM

## 2015-11-01 DIAGNOSIS — I739 Peripheral vascular disease, unspecified: Secondary | ICD-10-CM

## 2015-11-07 ENCOUNTER — Ambulatory Visit (HOSPITAL_BASED_OUTPATIENT_CLINIC_OR_DEPARTMENT_OTHER)
Admission: RE | Admit: 2015-11-07 | Discharge: 2015-11-07 | Disposition: A | Discharge status: Home / Self Care | Payer: Managed Care, Other (non HMO) | Source: Ambulatory Visit | Attending: Family | Admitting: Family

## 2015-11-07 ENCOUNTER — Ambulatory Visit
Admission: RE | Admit: 2015-11-07 | Discharge: 2015-11-07 | Disposition: A | Discharge status: Home / Self Care | Payer: Managed Care, Other (non HMO) | Source: Ambulatory Visit | Attending: Family | Admitting: Family

## 2015-11-07 ENCOUNTER — Other Ambulatory Visit (INDEPENDENT_AMBULATORY_CARE_PROVIDER_SITE_OTHER): Payer: Self-pay | Admitting: Family

## 2015-11-07 ENCOUNTER — Encounter (HOSPITAL_COMMUNITY): Payer: Self-pay

## 2015-11-07 DIAGNOSIS — N6011 Diffuse cystic mastopathy of right breast: Secondary | ICD-10-CM

## 2015-11-07 DIAGNOSIS — R928 Other abnormal and inconclusive findings on diagnostic imaging of breast: Secondary | ICD-10-CM | POA: Insufficient documentation

## 2015-11-07 DIAGNOSIS — N6012 Diffuse cystic mastopathy of left breast: Secondary | ICD-10-CM

## 2015-11-07 DIAGNOSIS — N63 Unspecified lump in unspecified breast: Secondary | ICD-10-CM

## 2015-11-07 DIAGNOSIS — N6489 Other specified disorders of breast: Secondary | ICD-10-CM

## 2015-11-08 ENCOUNTER — Other Ambulatory Visit (INDEPENDENT_AMBULATORY_CARE_PROVIDER_SITE_OTHER): Payer: Self-pay | Admitting: Family

## 2015-11-08 DIAGNOSIS — R928 Other abnormal and inconclusive findings on diagnostic imaging of breast: Secondary | ICD-10-CM

## 2015-12-15 DIAGNOSIS — M25561 Pain in right knee: Secondary | ICD-10-CM

## 2015-12-15 DIAGNOSIS — R0989 Other specified symptoms and signs involving the circulatory and respiratory systems: Secondary | ICD-10-CM

## 2015-12-15 DIAGNOSIS — M7989 Other specified soft tissue disorders: Secondary | ICD-10-CM

## 2015-12-21 DIAGNOSIS — M23200 Derangement of unspecified lateral meniscus due to old tear or injury, right knee: Secondary | ICD-10-CM

## 2016-01-23 ENCOUNTER — Ambulatory Visit (INDEPENDENT_AMBULATORY_CARE_PROVIDER_SITE_OTHER): Payer: Self-pay | Admitting: Rheumatology

## 2016-01-23 NOTE — Telephone Encounter (Signed)
Records faxed at this time.Honor Lohonya Sue Lars Jeziorski, LPN  1/6/10964/01/2016, 04:5414:11

## 2016-01-23 NOTE — Telephone Encounter (Signed)
Regarding: Release of Med Rec Request  ----- Message from Darius BumpHassan Andrew [facility] sent at 01/23/2016 10:52 AM EDT -----  Alwyn PeaHornsby, Jo Ann Allen, MD     Pt is requesting that all reports and test results from previous appointments sent to:  Dr. Leo RodKing Gogh  Phone: [phone]  Fax [phone]

## 2016-02-01 ENCOUNTER — Encounter (INDEPENDENT_AMBULATORY_CARE_PROVIDER_SITE_OTHER): Payer: Self-pay | Admitting: Rheumatology

## 2016-02-01 ENCOUNTER — Ambulatory Visit (INDEPENDENT_AMBULATORY_CARE_PROVIDER_SITE_OTHER): Payer: Managed Care, Other (non HMO) | Admitting: Rheumatology

## 2016-02-01 VITALS — BP 120/82 | HR 67 | Temp 98.1°F | Resp 16 | Ht 67.0 in | Wt 155.0 lb

## 2016-02-01 DIAGNOSIS — Z719 Counseling, unspecified: Secondary | ICD-10-CM

## 2016-02-01 DIAGNOSIS — D8489 Other immunodeficiencies (CMS HCC): Secondary | ICD-10-CM

## 2016-02-01 DIAGNOSIS — M3500 Sicca syndrome, unspecified: Secondary | ICD-10-CM

## 2016-02-01 DIAGNOSIS — M797 Fibromyalgia: Secondary | ICD-10-CM

## 2016-02-01 DIAGNOSIS — D849 Immunodeficiency, unspecified: Secondary | ICD-10-CM

## 2016-02-01 DIAGNOSIS — M1711 Unilateral primary osteoarthritis, right knee: Secondary | ICD-10-CM | POA: Insufficient documentation

## 2016-02-01 DIAGNOSIS — M159 Polyosteoarthritis, unspecified: Secondary | ICD-10-CM

## 2016-02-01 DIAGNOSIS — M179 Osteoarthritis of knee, unspecified: Secondary | ICD-10-CM

## 2016-02-01 DIAGNOSIS — M150 Primary generalized (osteo)arthritis: Secondary | ICD-10-CM

## 2016-02-01 NOTE — Progress Notes (Signed)
[facility]  [place] [address]  Phone: [phone]  Fax: [phone]    Date: 02/01/2016  Name: Tracie [name]  Age: 50 y.o.  MRN: [id number]    Chief Complaint:     Tracie [name] is a 50 y.o. y.o. female referred by Reginal Lutes, CFNP for   Chief Complaint   Patient presents with    New Patient    Sjogren's Disease    Leg Pain    Knee Pain   .     HPI  I had the pleasure to meet Tracie [name] in clinic today for evaluation of Sjogren syndrome.  Her husband has accompanied her in this clinic visit. Her symptoms started about 20 years ago with fatigue, body aches, sicca symptoms. The diagnosis was made in 2002. It was confirmed with positive Schirmer test. She uses salagen 7.50m bid, biotene mouth wash, lemon drop. She has history of oral cavity , and seeing a dentist. Her last eye visit in Fall 2016, now on regular eye drops and restasis. There was previous punctate plugging. Her symptoms has worsen in the last 3 years, with burning / pain sensation, worse in the right leg. She has right knee pain with arthritis about 2 years ago. A year ago, she has to miss one year from recurrent infection , and was diagnose Primary immunodeficiency disease, and she is currently on subcutaneous immunoglobulin every week.  There is no significant family history for autoimmune disease, and denies toxic habits. She is a oGlass blower/designerin oCustomer service manager She has 3 adult childen, 2 miscarriages within 8 weeks, with a history of thrombosis.  She has a known patient [facility] last seen in May 2016 for management of Sjogren's and CVID on Hizentra at the time.  And [facility]Marland Kitchen   Pain: 3 /10  Fatigue: 6 /10      Review of Systems (negative except as noted; positive findings in bold)  Constitutional: weight changes, fatigue, malaise, fever, chills, sweats  Skin: rashes, photosensitivity,  hives, easy bruisability, alopecia, skin nodules or psoriasis  Eyes:  pain, redness, itching, visual blurring, dryness, foreign body sensation  ENT: tinnitus, hearing loss, sinus congestion, loss of smell, dry nose, bloody nose, oral ulcers, loss of taste, dry mouth, hoarseness  Cardiac: chest pain, palpitations, exertional dyspnea  Vascular: Raynauds, chilblains, frostbite, venous stasis, thrombosis  Pulmonary: shortness of breath, cough, wheeze, hemoptysis, chest wall pain, pleuritic chest pain, current tobacco use  GI: difficulty swallowing, nausea, vomiting, GERD, ulcers, constipation, diarrhea, change in bowel habits, abdominal pain, liver disease  GU: dysuria, blood in urine, nocturia, infections, kidney stones  Musculoskeletal: morning stiffness, neck pain, back pain, joint pain, joint swelling, muscle aching or tenderness, muscle weakness  Neurological: numbness, tingling, headaches, fainting, dizziness, imbalance, memory loss, seizure, stroke  Heme/Lymph: swollen or tender glands, anemia  Psychiatric:  anxiety, irritability, depression, sleep disturbance    Past Medical History  Past Medical History:   Diagnosis Date    Abnormal Pap smear 2001    LSIL    Acute neck pain 2014     muscle relaxant med    Breast cyst     bilateral, multiple, variable sizes.     Chest pain 01/28/2008    work-up negative    Dyspepsia 2014    Zantac med    Dysplasia of cervix 2001    Colpo  CIN 1    Hypothyroidism     Irregular heart beat     Psychosocial stressors 2013 - 2014     Sjogren's disease     Unspecified breast disorder 20004 - present     large breast cysts          Current Outpatient Prescriptions   Medication Sig    aMILoride-hydrochlorothiazide (MODURETIC) 5-50 mg Oral Tablet Take 1 Tab by mouth Once a day    Ascorbic Acid (VITAMIN C) 1,000 mg Oral Tablet Take 1,000 mg by mouth Once a day    cholecalciferol, vitamin D3, 1,000 unit Oral Tablet Take 1,000 Units by mouth Once a day Patient takes 2000 IU  nightly    cyanocobalamin (VITAMIN B12) 250 mcg Oral Tablet Take 250 mcg by mouth Four times daily with food    cycloSPORINE (RESTASIS) 0.05 % Ophthalmic Dropperette Instill 1 Drop into both eyes Every 12 hours    flecainide (TAMBOCOR) 50 mg Oral Tablet Take 50 mg by mouth Twice daily    gabapentin (NEURONTIN) 300 mg Oral Capsule Take 300 mg by mouth Once a day    Glucosamine-Chondroit-Vit C-Mn (GLUCOSAMINE 1500 COMPLEX) 500-400 mg Oral Capsule Take by mouth    Green Tea Leaf Extract (GREEN TEA) Oral Capsule Take by mouth    linaclotide (LINZESS) 145 mcg Oral Capsule Take 145 mcg by mouth Once a day    liothyronine (CYTOMEL) 25 mcg Oral Tablet Take 25 mcg by mouth Once a day    magnesium citrate (CITROMA) Oral Solution Take 296 mL by mouth One time Reported on 02/01/2016    medroxyPROGESTERone (PROVERA) 10 mg Oral Tablet Take 1 Tab (10 mg total) by mouth Once a day    Miscellaneous Medical Supply Misc by Does not apply route TRI Chromolean supplement    Pilocarpine HCl 7.5 mg Oral Tablet Take 1 Tab (7.5 mg total) by mouth Twice daily    Potassium 99 mg Tab take by mouth. 2 daily    prenatal vitamin-iron-folate Tablet Take 1 Tab by mouth Once a day    raNITIdine (ZANTAC) 300 mg Oral Tablet Take 300 mg by mouth Every evening    thyroid (ARMOUR THYROID) 60 mg Oral Tablet Take 60 mg by mouth Once a day    VIT E-TOCOTRIENOL GAM,ALPH,DEL ORAL Take by mouth Patient takes 400 IU daily    Zinc 50 mg Tab take by mouth.      Allergies   Allergen Reactions    Tetracycline        Family History  Family History   Problem Relation Age of Onset    Heart Disease Paternal Grandfather     Hypertension Paternal Grandfather     Heart Disease Father     Hypertension Father     Uterine Fibroids Mother      Hysterectomy at age 59    Other Mother      and x2 maternal aunts with  breast cysts - non cancerous.     Healthy Mother     Diabetes Paternal Grandmother     Other Sister      Menorrhagia with Ablation      Cancer Son      testicular    Leukemia Maternal Uncle     Breast Cancer Maternal Aunt      postmenopausal    Anesth Problems Neg Hx     Bleeding Prob Neg Hx          Social History  Social History  Social History    Marital status: Married     Spouse name: Pieter Partridge    Number of children: 3    Years of education: 12     Occupational History    Office  [employer]      sp:  Investment banker, corporate     Social History Main Topics    Smoking status: Former Smoker     Packs/day: 0.25     Years: 2.00     Types: Cigarettes     Quit date: 10/22/1991    Smokeless tobacco: Never Used    Alcohol use No    Drug use: No    Sexual activity: Yes     Partners: Male     Birth control/ protection: Vasectomy     Other Topics Concern    Abuse/Domestic Violence No    Breast Self Exam Yes    Caffeine Concern No    Calcium Intake Adequate No    Computer Use Yes    Exercise Concern No    Helmet Use Yes    Seat Belt Yes    Special Diet Yes     low sodium diet    Sunscreen Used No    Uses Gait Assitive Device (Cane, Environmental consultant, Etc) No    Right Hand Dominant Yes    Left Hand Dominant No    Ambidextrous No     Social History Narrative           Problem List  Patient Active Problem List   Diagnosis    Sjogren's syndrome    Hyperlipidemia, acquired    Atrial paroxysmal tachycardia (HCC)    Breast cyst    Psychosocial stressors    Acute neck pain    Dyspepsia    Primary immunodeficiency disorder (HCC)    Osteoarthritis of right knee       Physical Examination  BP 120/82   Pulse 67   Temp 36.7 C (98.1 F) (Oral)    Resp 16   Ht 1.702 m ('5\' 7"' )   Wt 70.3 kg (155 lb)   SpO2 93%   BMI 24.28 kg/m2    Wt Readings from Last 3 Encounters:   02/01/16 70.3 kg (155 lb)   02/21/15 69.6 kg (153 lb 7 oz)   06/08/14 70.9 kg (156 lb 6.4 oz)      BP Readings from Last 3 Encounters:   02/01/16 120/82   02/21/15 116/72   06/08/14 130/78       General: No acute distress. Normal affect.  Skin: No  rashes.  HEENT: PERRLA. No oral lesions.  Poor pooling of saliva  Neck: FROM. No cervical lymphadenopathy.  Chest: Clear to auscultation.  CV: S1 S2, RRR.  Mild systolic murmurs.  Abdomen: Normal bowel sounds. Soft and nontender. No hepatosplenomegaly.  Extremities: No clubbing, cyanosis, or edema.  MSK: FROM of shoulders. No synovitis of bilateral elbows, wrists, MCPs, PIPs or DIPs. Hand grasp 5/5. Hips with FROM. No synovitis of knees, ankles or MTP joints.  Tenderness in all quadrants  Neurologic: Grossly intact.       Labs and Imaging  I have review and discussed these results with patients.                                 Patient: KIERSTYN, BARANOWSKI  (O/P)   Acct #: [id number]  Check-In#/Test: [id number]  [place] NONINVASIVE LEG/ARM ARTERIES  Check-In Date/Time: 12/15/15 1628                                                                                       BILATERAL LOWER EXTREMITY ARTERIAL DOPPLER STUDY     Bilateral lower extremity noninvasive physiologic arterial evaluation  with segmental pressures, ABIs and PVR waveform analysis of the lower  extremities was performed at rest.  Ankle-brachial indices, toe brachial  indices, digital platysmal graphic evaluation of the toes and also  segmental digital platysmal graphic evaluation of the lower extremities  was performed.  Segmental pressures were also obtained.     FINDINGS: The patient had multiphasic femoral, popliteal and tibial  waveforms. Normal ABIs and normal PVRs.     ASSESSMENT: There is no hemodynamically significant arterial disease  identified in both lower extremities. Clinical correlation is suggested.       ------------------------------------------------------------------------------             REPORT STATUS: FINAL    [facility]    Patient: SHARAINE, DELANGE  (O/P)   Acct #: [id number]  Check-In#/Test: [id number]  MR MRI ANY JOINT LOW EXT WO CON *Right  Check-In Date/Time: 12/19/15 8786     History: Right knee pain.    FINDINGS:  Multiplanar multisequence MR imaging was obtained through the  right knee. There is a chronic tear involving the lateral meniscus,  particularly the anterior horn, compared to a prior right knee MRI from  March 28, 2014. There is minimal subcortical marrow edema involving the  posterior aspect of the lateral tibial plateau. The medial meniscus is  intact. The anterior and posterior cruciate ligaments are intact. The  medial collateral ligament and posterolateral corner structures are  intact. Quadriceps and patellar tendons are intact. There is no  significant joint fluid. Chondral surfaces are preserved. Mild  tricompartment osteoarthritis is present.    IMPRESSION: Chronic lateral meniscus tear as above. Mild tricompartment  osteoarthritis.     ------------------------------------------------------------------------------             REPORT STATUS: FINAL    FACILITY: [facility]    Patient: YUMIKO, ALKINS  (O/P)   Acct #: [id number]  Check-In#/Test: [id number]  MX [place] EXT VEINS COMPL BILAT LOWER  Check-In Date/Time: 12/15/15 1524     BILATERAL LOWER EXTREMITY VENOUS DUPLEX ULTRASOUND    REASON FOR EXAM:  Swelling.    FINDINGS:  Sonographic evaluation was performed of the right and left lower  extremity veins.  2D gray scale imaging was performed with Doppler  spectral analysis and color.    The common and superficial femoral vein as well as popliteal vein  compress normally and appear patent bilaterally.    IMPRESSION:  No evidence of right or left lower extremity DVT.     ------------------------------------------------------------------------------             REPORT STATUS: FINAL    FACILITY: [facility]    Patient: CHANNAH, GODEAUX  (O/P)   Acct #: [id number]  Check-In#/Test: [id number]  US KIDNEY AORTA ADRENAL COMP  Check-In Date/Time: 08/31/15 1318     EXAM:  US KIDNEY AORTA ADRENAL COMP  INDICATION:  N8110 CYSTOCELE, UNSPECIFIED: RAD, 1219758: 8325    Pelvic ultrasound:    INDICATION:  Cystocele.    The urinary bladder is well distended with a volume of 163 cc. No post  void residual noted.    Right kidney measures 11.9 x 4.3 cm and otherwise appears unremarkable.  No parenchymal abnormality or obstructive uropathy noted.    Left kidney measures 10.0 x 5.7 cm and otherwise appears unremarkable.  No parenchymal abnormality or obstructive uropathy noted.    IMPRESSION:  1. Unremarkable renal ultrasound.         ------------------------------------------------------------------------------         REPORT STATUS: FINAL    FACILITY: [facility]    Patient: MALAI, LADY  (O/P)   Acct #: [id number]  Check-In#/Test: [id number]  XT CHEST 2 VIEWS PA & LAT  Check-In Date/Time: 12/05/14 1430     EXAM:  XT CHEST 2 VIEWS PA' \T' \ LAT  INDICATION:  IMMUNOGLOBULIN: RAD, 4982641: 5830    Chest 2 views:    The heart size, mediastinum, and hilar regions appear within normal  limits. The lung fields appear free of infiltrate or vascular  engorgement.    IMPRESSION:  1. No acute process seen radiographically.       ------------------------------------------------------------------------------         REPORT STATUS: FINAL    FACILITY: [facility]    Patient: XITLALY, AULT  (O/P)   Acct #: [id number]  Check-In#/Test: [id number]  XT SPINE THORACIC 4 OR MORE VIEWS  Check-In Date/Time: 08/22/14 1632     Two views of the thoracic spine. No comparison is available.    History: MID BACK PAIN: RAD, 9407680: 8811.    Findings: No acute fracture or significant malalignment of thoracic  vertebrae is identified. No abnormal perivertebral soft tissue swelling  is seen. There is mild endplate sclerosis and minimal anterior endplate  osteophyte formation at all thoracic levels. There is a likely  physiological slight anterior wedging of midthoracic vertebrae.    Impression:   1. No acute bony abnormality is appreciated. DJD.     ------------------------------------------------------------------------------                REPORT STATUS: FINAL    FACILITY: [facility]    Patient: MEDINA, DEGRAFFENREID  (O/P)   Acct #: [id number]  Check-In#/Test: [id number]  XT SPINE [place] 4 OR MORE VIEWS  Check-In Date/Time: 08/22/14 1614     Five views of the lumbar spine. No comparison is available.    History: M545 LOW BACK PAIN: RAD, 0315945: 8592.    FINDINGS: There is a mild S-shaped scoliosis. There is mild sclerosis of  the sacroiliac joints. The L3-L4 and L4-L5 disc space heights are  diminished and there is endplate osteophyte and endplate sclerosis. A  little vacuum phenomenon is also present. There is mild facet  arthropathy at L. 34 through L5-S1. I see no significant central canal  or neuroforaminal encroachment. No acute fracture is identified.    IMPRESSION:  1. Scoliosis. DJD. No acute bony abnormality is identified.        ------------------------------------------------------------------------------         REPORT STATUS: FINAL    FACILITY: [facility]    Patient: SHARMANE, DAME  (O/P)   Acct #: [id number]  Check-In#/Test: [id number]  XT SPINE CERVICAL 4-5 VIEWS  Check-In Date/Time: 08/22/14 1614     Five views of the cervical spine reveal no acute fracture or  significant  malalignment. There is likely degenerative loss of C5-C6 through C7-T1  disc space height with mild endplate osteophyte and sclerosis at the  same levels. No prevertebral soft tissue swelling is seen.    IMPRESSION:  1. Mild DJD. No acute bony abnormality is identified.     ------------------------------------------------------------------------------             REPORT STATUS: FINAL    FACILITY: [facility]    Patient: CHESTINE, BELKNAP  (O/P)   Acct #: [id number]  Check-In#/Test: [id number]  XT CHEST 2 VIEWS PA & LAT  Check-In Date/Time: 07/08/14 1011       Two views of the chest demonstrates no infiltrate, pulmonary edema,  pleural effusion or pneumothorax. The heart size is normal.        IMPRESSION: No acute pulmonary  disease.       ------------------------------------------------------------------------------             REPORT STATUS: FINAL    FACILITY: [facility]    Patient: ZAYLA, AGAR  (O/P)   Acct #: [id number]  Check-In#/Test: [id number]  MR MRI ANY JOINT LOW EXT WO CON *Right  Check-In Date/Time: 03/28/14 1940     Multiplanar, multisequence, noncontrast, magnetic resonance imaging of  the right knee. No comparison is available. Correlation is made with  plain films dated 12/16/2011.    History: 719.46: RAD, 1610960: 4540J.    Findings: The quadriceps tendon, anterior cruciate, posterior cruciate,  and medial collateral ligaments appear intact. There is T2 signal  abnormality and truncation in the anterior horn of the lateral meniscus.  Consistent with complex tear. There is some heterogeneous material  within the joint space. Concerning for loose bodies. There is signal  abnormality in the vastus lateralis tendon, consistent with at least  partial tear. There is some signal abnormality in the fibular collateral  ligament, which is consistent with lateral collateral ligament strain.    IMPRESSION:  1. At least partial tear of the vastus lateralis tendon. Lateral  collateral ligament strain.  2. Complex tear in the anterior horn of lateral meniscus.       ------------------------------------------------------------------------------           Patient:     KEAUNA, BRASEL)   Acct #: [id number]  Accession #:  [id number]  CRP  Specimen: BLOOD   Test Status: F   Collection Date/Time: 03/22/13 20:14  Result Name         Results                                Reference Ranges  -----------         --------                               ----------------  C-REACTIVE PROTEIN: 0.2                                     0.0-0.5     mg/dL   COMMENTS:    Test performed at [facility],    [place] [address]       Patient:     HAILLIE, RADUHYQ-[id number])   Acct #:  [id number]  Accession #:  [id number]  ANTI-CENTROMERE SCR' \T' \ TITER  Specimen:  BLOOD   Test Status: F   Collection Date/Time: 03/22/13 20:13  Result Name         Results                                Reference Ranges  -----------         --------                               ----------------  CENTROMERE ABS:     NEGATIVE                                NEGATIVE  CENTROMERE AB TITER:NA                                      < 1:160   COMMENTS:    Test performed at [facility],    [place] [address]       Patient:     SHARAINE, DELANGEOAC-[id number])   Acct #: [id number]  Accession #:  [id number]  COMPLEMENT C4  Specimen: BLOOD   Test Status: F   Collection Date/Time: 03/22/13 20:13  Result Name         Results                                Reference Ranges  -----------         --------                               ----------------  C4 COMPLEMENT:      26                                      10-40       mg/dL   COMMENTS:    Test performed at [facility],    [place] [address]       Patient:     DEBRALEE, BRAAKSMAURK-[id number])   Acct #: [id number]  Accession #:  [id number]  COMPLEMENT C3  Specimen: BLOOD   Test Status: F   Collection Date/Time: 03/22/13 20:13  Result Name         Results                                Reference Ranges  -----------         --------                               ----------------  C3 COMPLEMENT:      127                                     90-180      mg/dL   COMMENTS:    Test performed at [facility],  [place] [address]       Patient:     SHAZIA, MITCHENERBLT-[id number])   Acct #: [id number]  Accession #:  [id number]  JO-1 IGG ANTIBODY  Specimen: BLOOD   Test Status: F   Collection Date/Time: 03/22/13 20:13  Result Name         Results                                Reference Ranges  -----------         --------                               ----------------  Jo-1 IgG ANTIBODY:  <  1.0                                   <  1.0        Index   COMMENTS:     Negative: < 1.0 Index Antibody Not Detected     @ Test Performed By:       [facility]. Leandro Reasoner, M.D., [facility]., Laboratory Director       [address] [place] [address]       CLIA  [id number]       Patient:     NOUR, RODRIGUESOTL-[id number])   Acct #: [id number]  Accession #:  [id number]  SCLERODERMA  Specimen: BLOOD   Test Status: F   Collection Date/Time: 03/22/13 20:13  Result Name         Results                                Reference Ranges  -----------         --------                               ----------------  SCLERO.Scl-70 AB:   3                                       <      20    UNITS   COMMENTS:                                              Units              Negative                        < 20              Weak Positive                   20 - 39              Moderate Positive               40 -  80              Strong Positive                 >80    Test performed at [facility],    [place] [address]       Patient:     NIVIA, GERVASEXTA-[id number])   Acct #: [id number]  Accession #:  [id number]  [facility] ANTIBODY  Specimen: BLOOD   Test Status: F   Collection Date/Time: 03/22/13 20:13  Result Name         Results                                Reference Ranges  -----------         --------                               ----------------  SM ANTIBODY:        5                                       <      20    UNITS   COMMENTS:                                             Units              Negative                        < 20              Weak Positive                   20 - 39              Moderate Positive               40 - 80              Strong Positive                 >80    Test performed at [facility],    [place] [address]       Patient:     CHANDRA, ASHERJQG-[id number])   Acct #: [id number]  Accession #:  [id number]  RNP  Specimen: BLOOD   Test Status: F    Collection Date/Time: 03/22/13 20:13  Result Name         Results                                Reference Ranges  -----------         --------                               ----------------  RNP ANTIBODY:       4                                       <  20    UNITS   COMMENTS:                                              Units              Negative                        < 20              Weak Positive                   20 - 39              Moderate Positive               40 - 80              Strong Positive                 >80    Test performed at [facility],    [place] [address]       Patient:     AUSHA, SIEHWIO-[id number])   Acct #: [id number]  Accession #:  [id number]  SSA' \T' \ SSB  Specimen: BLOOD   Test Status: F   Collection Date/Time: 03/22/13 20:13  Result Name         Results                                Reference Ranges  -----------         --------                               ----------------  SSA ANTIBODY:       114                               H     <      20    UNITS   COMMENTS:                                              Units              Negative                        < 20              Weak Positive                   20 - 39              Moderate Positive               40 - 80              Strong Positive                 >80  SSB ANTIBODY:       11                                      <  20    UNITS   COMMENTS:                                              Units              Negative                        < 20              Weak Positive                   20 - 39              Moderate Positive               40 - 80              Strong Positive                 >80    Test performed at [facility],    [place] [address]       Patient:     TESSI, EUSTACHEVPX-[id number])   Acct #: [id number]  Accession #:  [id number]  DNA ANTIBODY  Specimen: BLOOD   Test Status: F   Collection Date/Time: 03/22/13 20:13  Result Name         Results                                 Reference Ranges  -----------         --------                               ----------------  DNA (DS) ANTIBODY:  143                                     <     200    IU/mL   COMMENTS:                  IU/mL              Interpretation                  0 - 200            Negative                  201 - 300          Equivocal                  301 - 800          Moderate Positive                  >=801              Strong Positive    Test performed at [facility] [facility],    [place] [address]       Patient:     LADENE, ALLOCCAEXH-[id number])   Acct #: [id number]  Accession #:  [id number]  ANA  Specimen: BLOOD   Test Status: F   Collection Date/Time: 03/22/13 20:13  Result Name         Results                                Reference Ranges  -----------         --------                               ----------------  ANA:                Positive                          A     Negative  SPECKLED PATTERN:   >/= 1:640   COMMENTS:    SPECKLED PATTERN    -----------------------------------------------------    High titers (1:160 or greater) seen in nearly all patients    with mixed connective tissue disease. Intermediate titers    (1:80) may be seen in patients with Sjogren's syndrome and    progressive systemic sclerosis. Low titers (1:40) may be found    in systemic lupus erythematosis, discoid lupus, rheumatoid    arthritis, polymyositis, primary cirrhosis, and chronic active    hepatitis. The pattern may be drug-induced, and the prevalence    in normal persons increases with age.                   Patient:     QIARA, MINETTIWSF-[id number])   Acct #: [id number]  Accession #:  [id number]  CRP-HS  Specimen: BLOOD   Test Status: F   Collection Date/Time: 03/22/13 20:13  Result Name         Results                                Reference Ranges  -----------         --------                               ----------------  CRP:                0.225                                    0.000-0.748 mg/dL       Patient:     LAKESHA, LEVINSONPRF-[id number])   Acct #: [id number]  Accession #:  [id number]  URIC ACID  Specimen: BLOOD   Test Status: F   Collection Date/Time: 03/22/13 20:13  Result Name         Results                                Reference Ranges  -----------         --------                               ----------------  URIC ACID:          5.1  2.6-8.0     mg/dL       Patient:     VELINDA, WROBELXVQ-[id number])   Acct #: [id number]  Accession #:  [id number]  ESR  Specimen: BLOOD   Test Status: F   Collection Date/Time: 03/22/13 20:13  Result Name         Results                                Reference Ranges  -----------         --------                               ----------------  ESR:                11                                      0-20        mm/hr       REPORT STATUS: FINAL    FACILITY: [facility]    Patient: OKTOBER, GLAZER  (O/P)   Acct #: [id number]  Check-In#/Test: [id number]  MR MRI ANY JOINT LOW EXT WO CON *Left  Check-In Date/Time: 03/08/13 1853     Unenhanced MRI left foot.    Sagittal axial and coronal images performed. No plain films are  available for comparison. History states left foot pain following injury  5 weeks ago. Concern for stress fracture. I do not see any significant  marrow signal abnormality of the foot to suggest stress fracture. No  significant edema or soft tissue swelling identified. There are mild  midfoot degenerative changes. The major ligaments of the ankle medial  and lateral appeared be intact.    IMPRESSION. No evidence for stress fracture or other significant  derangement of the left foot or ankle identified by MRI.         ------------------------------------------------------------------------------    FACILITY: [facility]    Patient: QUANTISHA, MARSICANO  (O/P)   Acct #: [id number]  Accession # [id number] ANTI CCP  Specimen: BLOOD              Collected: 05/06/06  1605  Result Name                              Results                       Normal Range  CYCLIC [place]. IGG(UNITS):               3                             SEE BELOW   Add'l Comments:                          07/19 0825                                Report for Add'l Comments:    * Comments and Normal Ranges for Component  CYCLIC [place]. IGG(UNITS) *  **  Test Free Text **  Range\F\ < 20 UNITS             <  20 Units          Negative             20-39 Units         Weak Positive             40-59 Units         Moderate Positive             > or = 60 Units     Strong Positive  Test performed at [facility],  [place] [address]       FACILITY: [facility]    Patient: ALIZA, MORET  (O/P)   Acct #: [id number]  Accession # [id number] RA TEST  Specimen: BLOOD              Collected: 05/06/06 1605  Result Name                              Results                       Normal Range  RA:                                      NEGATIVE                      NEGATIVE         FACILITY: [facility]    Patient: SERAPHIM, AFFINITO  (O/P)   Acct #: [id number]  Accession # [id number] ANTI-ENA  Specimen: BLOOD              Collected: 03/11/06 1443  Result Name                              Results                       Normal Range  RNP ANTIBODY:                            NEGATIVE                      NEGATIVE    SMITH ANTIBODY:                          NEGATIVE                      NEGATIVE    Add'l Comments:                          05/26 1303                                Report for Add'l Comments:    * Comments and Normal Ranges for Component  SMITH ANTIBODY *  ** Test Free Text the**  Test performed at [facility],  [place] [address]  FACILITY: [facility]    Patient: CELISSE, CIULLA  (O/P)   Acct #: [id number]  Accession # [id number] SSA & SSB  Specimen: BLOOD              Collected: 03/11/06 1443  Result Name                              Results                        Normal Range  SSA ANTIBODY:                            POSITIVE         *A*          SEE BELOW   SSB ANTIBODY:                            NEGATIVE                      SEE BELOW   Add'l Comments:                          05/26 1303                                Report for Add'l Comments:    * Comments and Normal Ranges for Component  SSA ANTIBODY *  ** Test Interpretive Text **  Range\F\ Negative  * Comments and Normal Ranges for Component  SSB ANTIBODY *  ** Test Free Text **  Test performed at [facility],  [place] [address]  ** Test Interpretive Text **  Range\F\ Negative       FACILITY: [facility]    Patient: FAITHANNE, VERRET  (O/P)   Acct #: [id number]  Accession # [id number] LUPUS ANTICOAGULANT PANEL  Specimen: BLOOD              Collected: 03/11/06 1443  Result Name                              Results                       Normal Range  LUPUS ANTICOAG IL(SEC):                  TO FOLLOW        *A*          SEE BELOW   LUPUS ANTICOAGULANT:                     TO FOLLOW        *A*          SEE BELOW        FACILITY: [facility]    Patient: CALIANA, SPIRES  (O/P)   Acct #: [id number]  Accession # [id number] ANTI-CARDIOLIPIN ANTIBODIES  Specimen: BLOOD              Collected: 03/11/06 1444  Result Name  Results                       Normal Range  Add'l Comments:                          05/24 1307                                ANTI-CARDIOLIPIN G(GPL):                 3.9                           SEE BELOW   ANTI-CARDIOLIPIN M(MPL):                 11.4                          SEE BELOW   Report for Add'l Comments:    * Comments and Normal Ranges for Component  *ANTI-CARDIOLIPIN G(GPL) *  ** Test Free Text **           Cardiolipin Antibody, IgG           < 15 GPL\F\   Absent or none detected           15-19 GPL\F\ Inconclusive           20-79 GPL\F\ Moderate positive  >= 80 GPL\F\ High positive  * Comments and Normal Ranges for Component   *ANTI-CARDIOLIPIN M(MPL) *  ** Test Free Text **           Cardiolipin Antibody, IgM           < 12 MPL\F\   Absent or none detected           12-19 MPL\F\ Inconclusive           20-79 MPL\F\ Moderate positive           >= 80 MPL\F\ High positive  Test performed at [facility],  [place] [address]       FACILITY: [facility]    Patient: MACLAINE, AHOLA  (O/P)   Acct #: [id number]  Accession # [id number] ANTI DNA ANTIBODY  Specimen: BLOOD              Collected: 03/11/06 1443  Result Name                              Results                       Normal Range  ANTI-DNA*(IU/ml):                        < 25              A            SEE BELOW   Add'l Comments:                          05/24 1101  FACILITY: [facility]    Patient: SYRITA, DOVEL  (O/P)   Acct #: [id number]  Accession # [id number] C 4 COMPLEMENT  Specimen: BLOOD              Collected: 03/11/06 1444  Result Name                              Results                       Normal Range  COMPLEMENT C4*(mg/dL):                   26                            SEE BELOW   Add'l Comments:                          05/24 0854                                Report for Add'l Comments:    * Comments and Normal Ranges for Component  COMPLEMENT C4*(mg/dL) *  * Test Free Text **  Range\F\ 10-40 mg/dL  Test performed at [facility],  [place] [address]       FACILITY: [facility]    Patient: KEYIRA, MONDESIR  (O/P)   Acct #: [id number]  Accession # [id number] C 3 COMPLEMENT  Specimen: BLOOD              Collected: 03/11/06 1444  Result Name                              Results                       Normal Range  COMPLEMENT C3*(mg/dL):                   144                           SEE BELOW   Add'l Comments:                          05/24 0854                                Report for Add'l Comments:    * Comments and Normal Ranges for Component  COMPLEMENT C3*(mg/dL) *  ** Test  Free Text **  Range\F\ 90-180 mg/dL  Test performed at [facility],  [place] [address]       FACILITY: [facility]    Patient: LATANGELA, MCCOMAS  (O/P)   Acct #: [id number]  Accession # [id number] ANTI NUCLEAR ANTIBODY (ANA)  Specimen: BLOOD              Collected: 03/11/06 1442  Result Name                              Results  ANTI-NUCLEAR AB:  1320                                     INTERPRETATION:                          05/23 1215                                Report for INTERPRETATION:    ** Test Free Text **  SPECKLED PATTERN\F\ PATIENT TITER = 1\F\320  High titers (1\F\160 or greater) seen in nearly all patients  with mixed connective tissue disease. Intermediate titers  (1\F\80) may be seen in patients with Sjogren's syndrome and  progressive systemic sclerosis. Low titers (1\F\40) may be found  in systemic lupus erythematosis, discoid lupus, rheumatoid  arthritis, polymyositis, primary cirrhosis, and chronic active  hepatitis. The pattern may be drug-induced, and the prevalence  in normal persons increases with age.       FACILITY: [facility]    Patient: JENNALEE, GREAVES  (O/P)   Acct #: [id number]  Accession # [id number] PROTEIN ELECTROPHORESIS SERUM  Specimen: BLOOD              Collected: 03/11/06 1442  Result Name                              Results                       Normal Range  TOTAL PROTEIN(MG/DL):                    6.6                             6.1-7.9   ALBUMIN - TP FX(%):                      63.7                           59.7-70.6  GLOBULIN - TP FX(%):                     36.3                           29.4-40.3  ALPHA 1 - TP FX(%):                      2.9              H              1.4-2.7   ALPHA 2 - TP FX(%):                      12.3             H              7.2-11.1  BETA - TP FX(%):                         11.4  8.0-14.7  GAMMA - TP FX(%):                        9.7                              8.4-16.3  PATH. INTERP :                           05/23 1112                                Report for PATH. INTERP (CHEM):    * Test Free Text **  ALPHA 1 AND 2 ELEVATION CAN BE SEEN IN INFLAMMATION; LIVER DISEASE AND  KIDNEY DISEASE...DR. DATTA       FACILITY: [facility]    Patient: BATUL, DIEGO  (O/P)   Acct #: [id number]  Accession # [id number] IMMUNOFIXATION SERUM ADULT  Specimen: BLOOD              Collected: 03/11/06 1442  Result Name                              Results                       Normal Range  IGA(MG/DL):                              166                           SEE BELOW   IGG(MG/DL):                              764                           SEE BELOW   IGM(MG/DL):                              176                           SEE BELOW   Add'l Comments:                          05/23 1112                                PATH. INTERP:                            05/23 1112                                Report for Add'l Comments:    Report for PATH. INTERP:     ** Test Free Text   IFE SHOWS NO ABNORMAL BANDS...DR. Cy Blamer  Assessment and Plan:     Tracie [name] was seen today for new patient, sjogren's disease, leg pain and knee pain.    Diagnoses and all orders for this visit:    Sjogren's syndrome  Patient has longstanding symptoms of sicca, polyarthralgia and myalgia coupled with chronic fatigue point towards Sjogren syndrome.  This is supported with a positive ANA of 1-640 speckled pattern SSA antibody back in 2014. We discussed extensively regarding diagnosis, prognosis, and therapeutic options available for this condition.  The mainstay of therapy remains supportive treatment.  Patient is currently on Salagen, regular eye drops with rule stasis and Biotene mouthwash.  She is attending ophthalmology and dental care regularly.  I have requested labs listed below to further evaluate her disease activity.  We have discussed the risks of malignancy such as lymphoma which is strongly  associated with this condition.  I endorse age-appropriate cancer screening to the patient.  She should continue with Salagen 7.5 mg b.i.d., previously discussed the use of Plaquenil during this visit.  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 Barbaraann Rondo note, and she is receiving weekly subcutaneous immunoglobulin with Dr. Luan Pulling.  So far she is tolerating her therapy.  As renal, CVID disagreement weaker for autoimmune disease including lupus and Sjogren syndrome.  It is not beneficial to check her or antibodies S she is currently receiving immunoglobulin.  Primary immunodeficiency/CVID has increased risk of malignancy and-does patient to be updated on her age-appropriate cancer screening.  She should continue with immunoglobulin therapy.    Osteoarthritis of right knee, unspecified osteoarthritis type  Patient has chronic right knee pain from previous complex tear of lateral meniscus, with partial tear of the vastus lateralis tendon.  She follows up with Orthopedic surgery.  This condition with internal derangement of the right knee, which would benefit from arthroscopy and encourage follow-up with her orthopedic surgeon.    Primary osteoarthritis involving multiple joints  Tracie [name] has DJD of multiple joints including the cervical, and lumbar spine evident on imaging listed above.    Fibromyalgia syndrome  Multiple tender points, non-restorative sleep at night and diffuse body pain all point towards fibromyalgia. We have discussed at length with the patient about this  diagnosis and also provided reading material on it.  It is likely that her musculoskeletal symptoms derive from both Sjogren syndrome and fibromyalgia with current presentation.  It is difficult to tease 1 of from the other.  Patient with autoimmune disease often has secondary fibromyalgia syndrome.  I discussed my findings with the patient, and addressing her concerns.Fibromyalgia is a disease of chronic widespread musculoskeletal pain.  Symptoms of fibromyalgia can include fatigue, sleep disruption, headache, paresthesias, dyspareunia, and irritable bowel syndrome, among others.  The treatment of fibromyalgia involves a graded exercise program, often incorporating the use of physical therapy and aquatherapy.  At first, physical activity may worsen the musculoskeletal pain, but it should improve over time and the patient should be  encouraged to remain active.  If there is an underlying history of emotional trauma, or active depression or anxiety, the patient should also be referred to a therapist and/or a psychiatrist.  Ongoing therapy can also be helpful when dealing with chronic pain.  If there is a sleep disturbance, please consider sending the patient to see a sleep specialist.  Narcotic medications are not recommended for the treatment of fibromyalgia.  Sometimes medications can be used to help treat fibromyalgia.  The three FDA approved medications for fibromyalgia are pregabalin, duloxetine, and milnacipran.  Medications such as gabapentin, amitriptyline, muscle relaxants, and other antidepressant medications may be helpful.  Additional therapies, such as acupuncture, yoga, and reiki have been beneficial for some patients.       Health counseling  Patient Education/ Counseling:    Immunizations:    There is no immunization history on file for this patient.     Cancer Screening: Pateint is advise to keep up to date on Pap Smear, Mammogram, PSA, Colonoscopy    Bone Health: Encourage calcium and vitamin D  supplementation.    Cardiovascular:   Lipid panel: No components found for: LDL,   Lab Results   Component Value Date    TRIG 97 01/27/2008     Blood pressure:   BP Readings from Last 1 Encounters:   02/01/16 120/82       Smoking Status:  None      She will return for follow up in 3 months.    Recommendations  1. Take your medications as prescribed.  2. Avoid smoking and avoid any alcoholic beverages  3. Please contact your PCPs office if you develop a fever with flu like symptoms.  4. Please call the rheumatology office in advance if you need any refills.      Barrington Ellison, MD          This note was partially generated using MModal Fluency Direct system, and there may be some incorrect words, spellings, and punctuation that were not noted in checking the note before saving.

## 2016-02-01 NOTE — Progress Notes (Signed)
Labs being completed at PCP

## 2016-02-01 NOTE — Patient Instructions (Signed)
Sjogren Syndrome  Sjogren syndrome is a disease in which the body's natural defense system (immune system) turns against the body's own cells and attacks the glands that produce tears and saliva. Sjogren syndrome is sometimes linked to rheumatic disorders, such as rheumatoid arthritis and lupus. It is 10 times more common in women than in men. Most people with Sjogren syndrome are from 45 to 52 years of age.  CAUSES   The cause is unknown. It runs in families. It is possible that a trigger, such as a viral infection, can set it off.  SYMPTOMS   The main symptoms are:  · Dry mouth.    Chalky feeling, mouth feels like it is full of cotton.    Difficulty swallowing, speaking, or tasting.    Prone to cavities and mouth infections.  · Dry eyes.    Burning, itching, feels like sand in the eyes.    Blurry vision.    Light sensitive.  Other symptoms may include:  · Skin, nose, and vaginal dryness.  · Joint pain, stiffness, and muscle pain.  Other organs that may be affected include:  · Kidneys.  · Blood vessels.  · Lungs.  · Liver.  · Pancreas.  · Brain.  DIAGNOSIS   Diagnosis is first based on symptoms, medical history, and physical exam. Tests may also be done, including:  · Tear production test (Schirmer test).  · A thorough eye exam using a magnifying device (slit-lamp exam).  · Test to see the extent of eye damage using dye staining.  · Mouth exam to look for signs of salivary gland swelling and mouth dryness.  · Removal of a minor salivary gland from inside the lower lip to be studied under a microscope (lip biopsy).  · Blood tests. Routine and special blood studies will be checked. Antibodies that attack normal tissue (autoantibodies) may be present, such as antinuclear antibodies (ANAs), rheumatoid factors, and Sjogren antibodies (anti-SSA and anti-SSB).  · Chest X-ray.  · Urine tests (urinalysis).  TREATMENT   There is no known cure for this syndrome. There is no specific treatment to restore gland secretion,  either. Treatment varies and is generally based upon the problems present.  · Moisture replacement therapies may ease the symptoms of dryness.  · Nonsteroidal anti-inflammatory drugs (NSAIDs), such as ibuprofen, may be used to treat musculoskeletal symptoms.  · Corticosteroids, such as prednisone, may be given for people with severe complications.  · Immunosuppressive drugs may be prescribed to control overactivity of the immune system. In severe cases, this overactivity can lead to organ damage.  · A surgical procedure called punctal occlusion may be considered. This is done to close the tear ducts, helping to keep more natural tears on the eye's surface.  A small percentage of people with Sjogren syndrome develop lymphoma. If you are worried that you might develop lymphoma, talk to your caregiver to learn more about the disease and the symptoms to watch.  HOME CARE INSTRUCTIONS   · Eye care:    Use eye drops as specified by your caregiver.    Try to blink 5 to 6 times per minute.    Protect your eyes from drafts and breezes.    Maintain properly humidified air.    Avoid smoke.  · Mouth care:    Sugar-free gum and hard candy can help in some people.    Take frequent sips of water or sugar-free drinks.    Use lip balm, saliva substitutes, and prescription medicines as directed.    SEEK MEDICAL CARE IF:   · You have an oral temperature above 102° F (38.9° C).  · You have night sweats.  · You develop constant fatigue.  · You have unexplained weight loss.  · You develop itchy skin.  · You have reddened patches on the skin.  FOR MORE INFORMATION   Sjogren's Syndrome Foundation: www.sjogrens.org  National Institute of Arthritis and Musculoskeletal and Skin Diseases: www.niams.nih.gov     This information is not intended to replace advice given to you by your health care provider. Make sure you discuss any questions you have with your health care provider.     Document Released: 09/27/2002 Document Revised: 10/28/2014  Document Reviewed: 02/12/2010  Elsevier Interactive Patient Education ©2016 [employer]

## 2016-03-06 DIAGNOSIS — N390 Urinary tract infection, site not specified: Secondary | ICD-10-CM

## 2016-04-09 ENCOUNTER — Other Ambulatory Visit (INDEPENDENT_AMBULATORY_CARE_PROVIDER_SITE_OTHER): Payer: Self-pay | Admitting: Family

## 2016-04-09 DIAGNOSIS — Z09 Encounter for follow-up examination after completed treatment for conditions other than malignant neoplasm: Secondary | ICD-10-CM

## 2016-04-30 ENCOUNTER — Ambulatory Visit
Admission: RE | Admit: 2016-04-30 | Discharge: 2016-04-30 | Disposition: A | Discharge status: Home / Self Care | Payer: Managed Care, Other (non HMO) | Source: Ambulatory Visit | Attending: Family | Admitting: Family

## 2016-04-30 DIAGNOSIS — N6002 Solitary cyst of left breast: Secondary | ICD-10-CM | POA: Insufficient documentation

## 2016-04-30 DIAGNOSIS — N6011 Diffuse cystic mastopathy of right breast: Secondary | ICD-10-CM

## 2016-04-30 DIAGNOSIS — Z09 Encounter for follow-up examination after completed treatment for conditions other than malignant neoplasm: Secondary | ICD-10-CM

## 2016-04-30 DIAGNOSIS — N6012 Diffuse cystic mastopathy of left breast: Secondary | ICD-10-CM

## 2016-04-30 DIAGNOSIS — N6001 Solitary cyst of right breast: Secondary | ICD-10-CM | POA: Insufficient documentation

## 2016-04-30 DIAGNOSIS — Z1239 Encounter for other screening for malignant neoplasm of breast: Secondary | ICD-10-CM | POA: Insufficient documentation

## 2016-05-02 ENCOUNTER — Encounter (INDEPENDENT_AMBULATORY_CARE_PROVIDER_SITE_OTHER): Payer: Self-pay | Admitting: Rheumatology

## 2016-05-02 ENCOUNTER — Ambulatory Visit (INDEPENDENT_AMBULATORY_CARE_PROVIDER_SITE_OTHER): Payer: Managed Care, Other (non HMO) | Admitting: Rheumatology

## 2016-05-02 VITALS — BP 129/61 | HR 78 | Temp 98.4°F | Resp 16 | Ht 67.0 in | Wt 151.0 lb

## 2016-05-02 DIAGNOSIS — Z712 Person consulting for explanation of examination or test findings: Secondary | ICD-10-CM

## 2016-05-02 DIAGNOSIS — M797 Fibromyalgia: Secondary | ICD-10-CM

## 2016-05-02 DIAGNOSIS — D8489 Other immunodeficiencies (CMS HCC): Secondary | ICD-10-CM

## 2016-05-02 DIAGNOSIS — M3500 Sicca syndrome, unspecified: Secondary | ICD-10-CM

## 2016-05-02 DIAGNOSIS — M159 Polyosteoarthritis, unspecified: Secondary | ICD-10-CM

## 2016-05-02 DIAGNOSIS — M150 Primary generalized (osteo)arthritis: Secondary | ICD-10-CM

## 2016-05-02 DIAGNOSIS — D849 Immunodeficiency, unspecified: Secondary | ICD-10-CM

## 2016-05-02 DIAGNOSIS — Z7189 Other specified counseling: Secondary | ICD-10-CM

## 2016-05-02 NOTE — Progress Notes (Signed)
[facility] RHEUMATOLOGY [place]  [address]  [place] [address]  Phone: [phone]  Fax: [phone]    Date: 05/02/2016  Name: Tracie [name]  Age: 50 y.o.  MRN: [id number]    Chief Complaint:     KAM KUSHNIR is a 50 y.o. y.o. female referred by No ref. provider found for   Chief Complaint   Patient presents with    Multiple Sites Joint Pain    Joint Swelling   .   Sjogren syndrome diagnosed in 2014 with positive ANA 1-640 speckled pattern and SSA   Primary immunodeficiency disorder  Osteoarthritis  Fibromyalgia syndrome    On Salagen 7.5mg  b.i.d. gabapentin 300 daily     HPI  Tracie [name] has return to clinic for follow-up visit.  She was last seen in April 2017 for management of Sjogren syndrome. She has pain in both legs, worse in left knee. There is burning and stiffness in the knee. She is unable to sleep with the pain. Ibuprofen helps with the pain. The knee pain has affected her right hips. There is pain and numbness in right hand , which wake her up in the night. She needs to shake it off. Her sicca symptoms has improved, with her last eye visit one month ago. She is restasis and biotene. She has pain in right knee, with meniscal tear and had IA injection about 2 months ago. There is no new rash, oral ulcers, new hair loss, sicca, photosensitivity, raynauds, chest pain, change in breathing, appetite, weight, bowel habits, and denied fever, recent infection or constitutional symptoms.     Pain: 3 /10  Fatigue: 5 /10      Review of Systems (negative except as noted; positive findings in bold)  Constitutional: weight changes, fatigue, malaise, fever, chills, sweats  Skin: rashes, photosensitivity, hives, easy bruisability, alopecia, skin nodules or psoriasis  Eyes:  pain, redness, itching, visual blurring, dryness, foreign body sensation  Cardiac: chest pain, palpitations, exertional dyspnea  Pulmonary: shortness of breath, cough, wheeze, hemoptysis, chest wall pain, pleuritic chest pain, current  tobacco use  GI: difficulty swallowing, nausea, vomiting, GERD, ulcers, constipation, diarrhea, change in bowel habits, abdominal pain, liver disease  GU: dysuria, blood in urine, nocturia, infections, kidney stones      Past Medical History  Past Medical History:   Diagnosis Date    Abnormal Pap smear 2001    LSIL    Acute neck pain 2014     muscle relaxant med    Breast cyst     bilateral, multiple, variable sizes.     Chest pain 01/28/2008    work-up negative    Dyspepsia 2014    Zantac med    Dysplasia of cervix 2001    Colpo CIN 1    Hypothyroidism     Irregular heart beat     Psychosocial stressors 2013 - 2014     Sjogren's disease     Unspecified breast disorder 62952 - present     large breast cysts          Current Outpatient Prescriptions   Medication Sig    aMILoride-hydrochlorothiazide (MODURETIC) 5-50 mg Oral Tablet Take 1 Tab by mouth Once a day    Ascorbic Acid (VITAMIN C) 1,000 mg Oral Tablet Take 1,000 mg by mouth Once a day    cholecalciferol, vitamin D3, 1,000 unit Oral Tablet Take 1,000 Units by mouth Once a day Patient takes 2000 IU nightly    cyanocobalamin (VITAMIN B12) 250 mcg Oral  Tablet Take 250 mcg by mouth Four times daily with food    cycloSPORINE (RESTASIS) 0.05 % Ophthalmic Dropperette Instill 1 Drop into both eyes Every 12 hours    flecainide (TAMBOCOR) 50 mg Oral Tablet Take 50 mg by mouth Twice daily    gabapentin (NEURONTIN) 300 mg Oral Capsule Take 300 mg by mouth Once a day    Glucosamine-Chondroit-Vit C-Mn (GLUCOSAMINE 1500 COMPLEX) 500-400 mg Oral Capsule Take by mouth    Green Tea Leaf Extract (GREEN TEA) Oral Capsule Take by mouth    linaclotide (LINZESS) 145 mcg Oral Capsule Take 145 mcg by mouth Once a day    liothyronine (CYTOMEL) 25 mcg Oral Tablet Take 25 mcg by mouth Once a day    magnesium citrate (CITROMA) Oral Solution Take 296 mL by mouth One time Reported on 02/01/2016    medroxyPROGESTERone (PROVERA) 10 mg Oral Tablet Take 1 Tab (10 mg total)  by mouth Once a day    Miscellaneous Medical Supply Misc by Does not apply route TRI Chromolean supplement    Pilocarpine HCl 7.5 mg Oral Tablet Take 1 Tab (7.5 mg total) by mouth Twice daily    Potassium 99 mg Tab take by mouth. 2 daily    prenatal vitamin-iron-folate Tablet Take 1 Tab by mouth Once a day    raNITIdine (ZANTAC) 300 mg Oral Tablet Take 300 mg by mouth Every evening    thyroid (ARMOUR THYROID) 60 mg Oral Tablet Take 60 mg by mouth Once a day    VIT E-TOCOTRIENOL GAM,ALPH,DEL ORAL Take by mouth Patient takes 400 IU daily    Zinc 50 mg Tab take by mouth.      Allergies   Allergen Reactions    Tetracycline        Family History  Family History   Problem Relation Age of Onset    Heart Disease Paternal Grandfather     Hypertension Paternal Grandfather     Heart Disease Father     Hypertension Father     Uterine Fibroids Mother      Hysterectomy at age 52    Other Mother      and x2 maternal aunts with  breast cysts - non cancerous.     Healthy Mother     Diabetes Paternal Grandmother     Other Sister      Menorrhagia with Ablation     Cancer Son      testicular    Leukemia Maternal Uncle     Breast Cancer Maternal Aunt      postmenopausal    Anesth Problems Neg Hx     Bleeding Prob Neg Hx          Social History  Social History     Social History    Marital status: Married     Spouse name: Kendell Baneroy    Number of children: 3    Years of education: 12     Occupational History    Office  Clerk      [employer]      sp:  Programmer, multimediaostal service spvr     Social History Main Topics    Smoking status: Former Smoker     Packs/day: 0.25     Years: 2.00     Types: Cigarettes     Quit date: 10/22/1991    Smokeless tobacco: Never Used    Alcohol use No    Drug use: No    Sexual  activity: Yes     Partners: Male     Birth control/ protection: Vasectomy     Other Topics Concern    Abuse/Domestic Violence No    Breast Self Exam Yes    Caffeine Concern No    Calcium Intake Adequate  No    Computer Use Yes    Exercise Concern No    Helmet Use Yes    Seat Belt Yes    Special Diet Yes     low sodium diet    Sunscreen Used No    Uses Gait Assitive Device (Cane, Environmental consultant, Et[employer]) No    Right Hand Dominant Yes    Left Hand Dominant No    Ambidextrous No     Social History Narrative           Problem List  Patient Active Problem List   Diagnosis    Sjogren's syndrome    Hyperlipidemia, acquired    Atrial paroxysmal tachycardia (HCC)    Breast cyst    Psychosocial stressors    Acute neck pain    Dyspepsia    Primary immunodeficiency disorder (HCC)    Osteoarthritis of right knee    Primary osteoarthritis involving multiple joints    Fibromyalgia syndrome       Physical Examination  BP 129/61   Pulse 78   Temp 36.9 C (98.4 F) (Oral)    Resp 16   Ht 1.702 m (5\' 7" )   Wt 68.5 kg (151 lb)   SpO2 100%   BMI 23.65 kg/m2    Wt Readings from Last 3 Encounters:   05/02/16 68.5 kg (151 lb)   02/01/16 70.3 kg (155 lb)   02/21/15 69.6 kg (153 lb 7 oz)      BP Readings from Last 3 Encounters:   05/02/16 129/61   02/01/16 120/82   02/21/15 116/72       General: No acute distress. Normal affect.  Skin: No rashes.  HEENT: PERRLA. No oral lesions.  Neck: FROM. No cervical lymphadenopathy.  Chest: Clear to auscultation.  CV: S1 S2, RRR. No murmurs.  Abdomen: Normal bowel sounds. Soft and nontender. No hepatosplenomegaly.  Extremities: No clubbing, cyanosis, or edema.  MSK: FROM of shoulders. No synovitis of bilateral elbows, wrists, MCPs, PIPs or DIPs. Hand grasp 5/5. Hips with FROM.  TTP across joint line of both knees, worse the right, no joint effusion.  No synovitis of  ankles or MTP joints.  Tenderness in all quadrants  Neurologic: Grossly intact.       Labs and Imaging  I have review and discussed these results with patients.                                                                                                 Assessment and Plan:     Tracie [name] was seen today for multiple sites joint pain  and joint swelling.    Diagnoses and all orders for this visit:    Sjogren's syndrome  Tracie [name] has Sjogren syndrome which has responded to Salagen and gabapentin.  Her sicca symptoms has improved since the  last visit, which has come for refer recent ophthalmology consultation.  On top of that, she will be attending neurology clinic tomorrow for evaluation of small fiber neuropathy status post skin biopsy.  Overall, I think her condition has been stable, if not her sicca symptoms has improved.  I am satisfied with her progress.  I discussed my findings with the patient, addressing concerns, and discussed treatment options regarding this condition.  She should continue with Salagen 7.5 mg b.i.d. and continue with her regular ophthalmology and dentist follow-up.    Primary immunodeficiency disorder (HCC)  Tracie [name] has a history of primary immunodeficiency and has not had immunoglobulin infusion for last 3 months.  Her last immunoglobulin levels were satisfactory.  Her immunology is has continue to monitor her immunoglobulin levels.  There is no intervention needed at this time.     Primary osteoarthritis involving multiple joints  Tracie [name] has osteoarthritis of multiple joints, which has worsened since the last visit, especially in the right knee.  She had recent therapeutic arthrocentesis of the right knee 2 months ago without significant improvement.  I discussed my findings with the patient, addressing concerns, and we will schedule her to return in 4 weeks for repeat therapeutic arthrocentesis of the right knee for have alleviate his symptoms.  She had recent MRI of the right knee which shows chronic meniscal tear with tricompartmental osteoarthritis.  Advised to seek improved with Orthopedics regarding further intervention in the future.    Fibromyalgia syndrome  Multiple tender points, non-restorative sleep at night and diffuse body pain all point towards fibromyalgia. We have discussed at length with the patient about  this diagnosis and also provided reading material on it.    Encounter to discuss test results  I had discussed and disclosed  all the test results as listed above, and addressed all the concerns.            She will return for follow up in 4 weeks for schedule right knee intra-articular steroid injection.    Recommendations  1. Take your medications as prescribed.  2. Avoid smoking and avoid any alcoholic beverages  3. Please contact your PCPs office if you develop a fever with flu like symptoms.  4. Please call the rheumatology office in advance if you need any refills.      Adelene Amas, MD    Spent 25-minute face-to-face time with the patient of which more than 50% of the time was spent on counseling on test results, clinical implications, etiology, diagnosis, prognosis, treatment options, risks and benefits.      This note was partially generated using MModal Fluency Direct system, and there may be some incorrect words, spellings, and punctuation that were not noted in checking the note before saving.Dot

## 2016-05-10 ENCOUNTER — Emergency Department (EMERGENCY_DEPARTMENT_HOSPITAL): Admission: EM | Admit: 2016-05-10 | Discharge: 2016-05-10 | Payer: Managed Care, Other (non HMO)

## 2016-05-10 ENCOUNTER — Emergency Department (EMERGENCY_DEPARTMENT_HOSPITAL): Payer: Managed Care, Other (non HMO)

## 2016-05-10 DIAGNOSIS — R1084 Generalized abdominal pain: Secondary | ICD-10-CM

## 2016-05-10 DIAGNOSIS — R008 Other abnormalities of heart beat: Secondary | ICD-10-CM

## 2016-05-15 ENCOUNTER — Ambulatory Visit (INDEPENDENT_AMBULATORY_CARE_PROVIDER_SITE_OTHER): Payer: Managed Care, Other (non HMO) | Admitting: Sports Medicine

## 2016-05-30 ENCOUNTER — Encounter (INDEPENDENT_AMBULATORY_CARE_PROVIDER_SITE_OTHER): Payer: Self-pay | Admitting: Rheumatology

## 2016-06-03 ENCOUNTER — Encounter (INDEPENDENT_AMBULATORY_CARE_PROVIDER_SITE_OTHER): Payer: Self-pay | Admitting: Rheumatology

## 2016-06-05 DIAGNOSIS — I499 Cardiac arrhythmia, unspecified: Secondary | ICD-10-CM

## 2016-06-05 DIAGNOSIS — E559 Vitamin D deficiency, unspecified: Secondary | ICD-10-CM

## 2016-06-05 DIAGNOSIS — E039 Hypothyroidism, unspecified: Secondary | ICD-10-CM

## 2016-06-05 DIAGNOSIS — N390 Urinary tract infection, site not specified: Secondary | ICD-10-CM

## 2016-06-05 DIAGNOSIS — K5909 Other constipation: Secondary | ICD-10-CM

## 2016-06-05 DIAGNOSIS — G629 Polyneuropathy, unspecified: Secondary | ICD-10-CM

## 2016-07-15 ENCOUNTER — Other Ambulatory Visit (INDEPENDENT_AMBULATORY_CARE_PROVIDER_SITE_OTHER): Payer: Self-pay | Admitting: Family

## 2016-07-15 ENCOUNTER — Ambulatory Visit (HOSPITAL_COMMUNITY): Payer: Managed Care, Other (non HMO)

## 2016-07-15 ENCOUNTER — Ambulatory Visit
Admission: RE | Admit: 2016-07-15 | Discharge: 2016-07-15 | Disposition: A | Discharge status: Home / Self Care | Payer: Managed Care, Other (non HMO) | Source: Ambulatory Visit | Attending: Family | Admitting: Family

## 2016-07-15 ENCOUNTER — Other Ambulatory Visit (HOSPITAL_COMMUNITY): Payer: Self-pay | Admitting: Family

## 2016-07-15 DIAGNOSIS — R509 Fever, unspecified: Secondary | ICD-10-CM

## 2016-07-15 LAB — CBC WITH DIFF
BASOPHIL #: 0 x10ˆ3/uL (ref 0.00–0.20)
BASOPHIL %: 1 %
EOSINOPHIL #: 0.2 x10ˆ3/uL (ref 0.00–0.50)
EOSINOPHIL %: 3 %
HCT: 43.5 % (ref 34.6–46.2)
HGB: 14.7 g/dL (ref 11.8–15.8)
LYMPHOCYTE #: 1.3 x10?3/uL (ref 0.90–3.40)
LYMPHOCYTE #: 1.3 x10ˆ3/uL (ref 0.90–3.40)
LYMPHOCYTE %: 19 %
LYMPHOCYTE %: 19 %
MCH: 28.4 pg (ref 27.6–33.2)
MCHC: 33.9 g/dL (ref 32.6–35.4)
MCV: 83.7 fL (ref 82.3–96.7)
MONOCYTE #: 0.4 x10ˆ3/uL (ref 0.20–0.90)
MONOCYTE %: 6 %
MPV: 9.6 fL (ref 6.6–10.2)
NEUTROPHIL #: 4.9 x10ˆ3/uL (ref 1.50–6.40)
NEUTROPHIL %: 72 %
PLATELETS: 221 x10ˆ3/uL (ref 140–440)
RBC: 5.2 x10ˆ6/uL (ref 3.80–5.24)
RDW: 13 % (ref 12.4–15.2)
WBC: 6.8 x10ˆ3/uL (ref 3.5–10.3)

## 2016-07-15 LAB — COMPREHENSIVE METABOLIC PANEL, NON-FASTING
ALBUMIN: 4.3 g/dL (ref 3.5–4.8)
ALKALINE PHOSPHATASE: 89 U/L (ref 20–130)
ALT (SGPT): 47 U/L — ABNORMAL HIGH (ref 7–42)
ANION GAP: 8 mmol/L
AST (SGOT): 35 U/L (ref 12–35)
BILIRUBIN TOTAL: 0.6 mg/dL (ref 0.3–1.2)
BUN/CREA RATIO: 30
BUN: 17 mg/dL (ref 8–20)
CALCIUM: 10.5 mg/dL — ABNORMAL HIGH (ref 8.9–10.3)
CHLORIDE: 100 mmol/L — ABNORMAL LOW (ref 101–111)
CO2 TOTAL: 28 mmol/L (ref 22–32)
CREATININE: 0.57 mg/dL — ABNORMAL LOW (ref 0.6–1.2)
ESTIMATED GFR: 111 mL/min/1.73mˆ2
ESTIMATED GFR: 111 mL/min/{1.73_m2}
GLUCOSE: 87 mg/dL (ref 70–110)
POTASSIUM: 3.6 mmol/L (ref 3.6–5.1)
PROTEIN TOTAL: 6.7 g/dL (ref 6.4–8.3)
SODIUM: 136 mmol/L (ref 136–144)

## 2016-07-15 LAB — INFLUENZA VIRUS TYPE A AND TYPE B, AND RESPIRATORY SYNCYTIAL VIRUS (RSV), PCR
INFLUENZA VIRUS TYPE A: NEGATIVE
INFLUENZA VIRUS TYPE B: NEGATIVE
RESPIRATORY SYNCYTIAL VIRUS (RSV): NEGATIVE

## 2016-07-15 LAB — MONO TEST: MONONUCLEOSIS RAPID TEST: NEGATIVE

## 2016-07-15 LAB — THYROID STIMULATING HORMONE (SENSITIVE TSH): TSH: 1.7 u[IU]/mL (ref 0.340–5.600)

## 2016-07-18 LAB — THROAT CULTURE, BETA HEMOLYTIC STREPTOCOCCUS: THROAT CULTURE: NO GROWTH

## 2016-07-18 LAB — URINE CULTURE: URINE CULTURE: NO GROWTH

## 2016-07-19 ENCOUNTER — Other Ambulatory Visit (HOSPITAL_COMMUNITY): Payer: Self-pay | Admitting: Neurology with Special Qualifications in Child Neurology

## 2016-07-20 LAB — ADULT ROUTINE BLOOD CULTURE, SET OF 2 BOTTLES (BACTERIA AND YEAST): BLOOD CULTURE, ROUTINE: NO GROWTH

## 2016-07-22 ENCOUNTER — Other Ambulatory Visit (HOSPITAL_BASED_OUTPATIENT_CLINIC_OR_DEPARTMENT_OTHER): Payer: Self-pay | Admitting: Adult Reconstructive Orthopaedic Surgery

## 2016-07-22 DIAGNOSIS — M25561 Pain in right knee: Secondary | ICD-10-CM

## 2016-07-25 ENCOUNTER — Ambulatory Visit

## 2016-07-26 ENCOUNTER — Inpatient Hospital Stay (HOSPITAL_COMMUNITY)
Admission: RE | Admit: 2016-07-26 | Discharge: 2016-07-26 | Disposition: A | Discharge status: Still In Hospital | Payer: Managed Care, Other (non HMO) | Source: Ambulatory Visit

## 2016-07-26 DIAGNOSIS — M3500 Sicca syndrome, unspecified: Secondary | ICD-10-CM

## 2016-07-26 DIAGNOSIS — Z5189 Encounter for other specified aftercare: Secondary | ICD-10-CM | POA: Insufficient documentation

## 2016-07-26 MED ORDER — ACETAMINOPHEN 325 MG TABLET
650.0000 mg | ORAL_TABLET | Freq: Once | ORAL | Status: AC
Start: 2016-07-26 — End: 2016-07-26
  Administered 2016-07-26: 650 mg via ORAL
  Filled 2016-07-26: qty 2

## 2016-07-26 MED ORDER — DIPHENHYDRAMINE 25 MG CAPSULE
25.0000 mg | ORAL_CAPSULE | Freq: Once | ORAL | Status: AC
Start: 2016-07-26 — End: 2016-07-26
  Administered 2016-07-26: 25 mg via ORAL
  Filled 2016-07-26: qty 1

## 2016-07-26 MED ORDER — IMMUNE GLOB,GAMMA (IGG) 10 %-GLY-IGA OVER 50 MCG/ML INJECTION SOLUTION
70.0000 g | Freq: Once | INTRAMUSCULAR | Status: AC
Start: 2016-07-26 — End: 2016-07-26
  Administered 2016-07-26 (×4): 70 g via INTRAVENOUS
  Administered 2016-07-26: 0 g via INTRAVENOUS
  Filled 2016-07-26: qty 700

## 2016-07-26 MED ADMIN — immune glob,gamma (IgG) 10 %-gly-IgA over 50 mcg/mL injection solution: INTRAVENOUS | @ 10:00:00

## 2016-07-26 NOTE — Nurses Notes (Addendum)
0914 Tylenol and Benadryl given PO. Charna ArcherMelissa Morgan, RN  [phone] - IVIG started to infuse at 25 ml/hour for the initial 15 minutes. Denies complaints.Blanchard Kelchavid Jentri Aye, RN  [phone] - Denies complaints, VS stable (see flow sheet). Rate increased to 50 ml/hour.Blanchard Kelchavid Denay Pleitez, RN  [phone] - Denies complaints, VS stable. Rate increased to 100 ml/hour.Blanchard Kelchavid Porcia Morganti, RN  [phone] - denies complaints, VS stable. Rate increased to 200 ml/hour for the remainder of the infusion.Blanchard Kelchavid Robbi Scurlock, RN  [phone] - Infusion complete. PIV flushed with 10 ml NS prior to discontinuing with cannula intact. Gauze and coban drsg applied. Tolerated infusion well and discharged ambulatory without complaints.Blanchard Kelchavid Gisella Alwine, RN

## 2016-08-02 ENCOUNTER — Inpatient Hospital Stay (HOSPITAL_COMMUNITY)
Admission: RE | Admit: 2016-08-02 | Discharge: 2016-08-02 | Disposition: A | Discharge status: Still In Hospital | Payer: Managed Care, Other (non HMO) | Source: Ambulatory Visit

## 2016-08-02 ENCOUNTER — Ambulatory Visit (HOSPITAL_COMMUNITY): Payer: Self-pay

## 2016-08-02 DIAGNOSIS — M3500 Sicca syndrome, unspecified: Secondary | ICD-10-CM

## 2016-08-02 MED ORDER — DIPHENHYDRAMINE 25 MG CAPSULE
25.0000 mg | ORAL_CAPSULE | Freq: Once | ORAL | Status: AC
Start: 2016-08-02 — End: 2016-08-02
  Administered 2016-08-02: 25 mg via ORAL
  Filled 2016-08-02: qty 1

## 2016-08-02 MED ORDER — ACETAMINOPHEN 325 MG TABLET
650.0000 mg | ORAL_TABLET | Freq: Once | ORAL | Status: AC
Start: 2016-08-02 — End: 2016-08-02
  Administered 2016-08-02: 650 mg via ORAL
  Filled 2016-08-02: qty 2

## 2016-08-02 MED ORDER — IMMUNE GLOB,GAMMA (IGG) 10 %-GLY-IGA OVER 50 MCG/ML INJECTION SOLUTION
70.0000 g | Freq: Once | INTRAMUSCULAR | Status: AC
Start: 2016-08-02 — End: 2016-08-02
  Administered 2016-08-02 (×3): 70 g via INTRAVENOUS
  Administered 2016-08-02: 0 g via INTRAVENOUS
  Administered 2016-08-02: 70 g via INTRAVENOUS
  Filled 2016-08-02: qty 700

## 2016-08-02 MED ADMIN — acetaminophen 325 mg tablet: ORAL | @ 09:00:00

## 2016-08-02 MED ADMIN — aspirin 81 mg chewable tablet: INTRAVENOUS | @ 10:00:00

## 2016-08-02 NOTE — Nurses Notes (Addendum)
[id number]: IVIG infusion started via patients LPIV, +blood return noted, educated pt on s/s of infusions reaction, pt voiced understanding, will monitor pt.Kristen LoaderKathy Deavin Forst, RN  [phone]: Increased IVIG to 1050ml/hr x 15 minutes, vitals obtained, pt voices no compliants, will monitor pt.Kristen LoaderKathy Ender Rorke, RN  [phone]: Increased IVIG to 15200ml/hr x 15 minutes, vitals obtained, pt voices no complaints, will monitor pt.Kristen LoaderKathy Sawsan Riggio, RN  [phone]: Increased IVIG to 24800ml/hr x 15 minutes, vitals obtained, will monitor pt throughout remaining infusion.Kristen LoaderKathy Bryton Waight, RN  1350: IVIG infusion completed, pt tolerated well and voices no complaints. Discontinued patients LPIV, band aide applied, pt left infusion center ambulatory without complaints.Marisa HuaKathy Yoland Scherr,RN

## 2016-08-02 NOTE — Nurses Notes (Signed)
[id number]- Started patient's IVIG in 22 G in right forearm at 25 ml/hr for 15 minutes. Severa Jeremiah, RN

## 2016-08-09 ENCOUNTER — Inpatient Hospital Stay (HOSPITAL_COMMUNITY)
Admission: RE | Admit: 2016-08-09 | Discharge: 2016-08-09 | Disposition: A | Discharge status: Still In Hospital | Payer: Managed Care, Other (non HMO) | Source: Ambulatory Visit

## 2016-08-09 ENCOUNTER — Ambulatory Visit (HOSPITAL_COMMUNITY): Payer: Self-pay

## 2016-08-09 DIAGNOSIS — M3500 Sicca syndrome, unspecified: Secondary | ICD-10-CM

## 2016-08-09 MED ORDER — DIPHENHYDRAMINE 25 MG CAPSULE
25.0000 mg | ORAL_CAPSULE | Freq: Once | ORAL | Status: AC
Start: 2016-08-09 — End: 2016-08-09
  Administered 2016-08-09: 25 mg via ORAL
  Filled 2016-08-09: qty 1

## 2016-08-09 MED ORDER — IMMUNE GLOB,GAMMA (IGG) 10 %-GLY-IGA OVER 50 MCG/ML INJECTION SOLUTION
70.0000 g | Freq: Once | INTRAMUSCULAR | Status: AC
Start: 2016-08-09 — End: 2016-08-09
  Administered 2016-08-09: 70 g via INTRAVENOUS
  Administered 2016-08-09: 0 g via INTRAVENOUS
  Administered 2016-08-09 (×3): 70 g via INTRAVENOUS
  Filled 2016-08-09: qty 700

## 2016-08-09 MED ORDER — ACETAMINOPHEN 325 MG TABLET
650.0000 mg | ORAL_TABLET | Freq: Once | ORAL | Status: AC
Start: 2016-08-09 — End: 2016-08-09
  Administered 2016-08-09: 650 mg via ORAL
  Filled 2016-08-09: qty 2

## 2016-08-09 MED ADMIN — immune glob,gamma (IgG) 10 %-gly-IgA over 50 mcg/mL injection solution: INTRAVENOUS | @ 10:00:00

## 2016-08-09 NOTE — Nurses Notes (Addendum)
0940: IVIG started to infuse over 15 minutes @ 25 mL/hour. Linde Gillisourtney Lynn Kailon Treese, RN  1343: IVIG complete. Line flushed. +BR. PIV removed. Gauze and coban applied. Patient elft ambulatory stable. Linde Gillisourtney Lynn Joniya Boberg, RN

## 2016-08-16 ENCOUNTER — Inpatient Hospital Stay (HOSPITAL_COMMUNITY)
Admission: RE | Admit: 2016-08-16 | Discharge: 2016-08-16 | Disposition: A | Discharge status: Still In Hospital | Payer: Managed Care, Other (non HMO) | Source: Ambulatory Visit

## 2016-08-16 ENCOUNTER — Encounter (HOSPITAL_COMMUNITY): Payer: Self-pay

## 2016-08-16 ENCOUNTER — Telehealth (HOSPITAL_BASED_OUTPATIENT_CLINIC_OR_DEPARTMENT_OTHER): Payer: Self-pay | Admitting: Neurology with Special Qualifications in Child Neurology

## 2016-08-16 DIAGNOSIS — M3500 Sicca syndrome, unspecified: Secondary | ICD-10-CM

## 2016-08-16 MED ORDER — DIPHENHYDRAMINE 25 MG CAPSULE
25.0000 mg | ORAL_CAPSULE | Freq: Once | ORAL | Status: AC
Start: 2016-08-16 — End: 2016-08-16
  Administered 2016-08-16: 25 mg via ORAL
  Filled 2016-08-16: qty 1

## 2016-08-16 MED ORDER — IMMUNE GLOB,GAMMA (IGG) 10 %-GLY-IGA OVER 50 MCG/ML INJECTION SOLUTION
70.0000 g | Freq: Once | INTRAMUSCULAR | Status: AC
Start: 2016-08-16 — End: 2016-08-16
  Administered 2016-08-16: 70 g via INTRAVENOUS
  Administered 2016-08-16: 0 g via INTRAVENOUS
  Administered 2016-08-16 (×3): 70 g via INTRAVENOUS
  Filled 2016-08-16: qty 700

## 2016-08-16 MED ORDER — ACETAMINOPHEN 325 MG TABLET
650.0000 mg | ORAL_TABLET | Freq: Once | ORAL | Status: AC
Start: 2016-08-16 — End: 2016-08-16
  Administered 2016-08-16: 650 mg via ORAL
  Filled 2016-08-16: qty 2

## 2016-08-16 MED ADMIN — sodium chloride 0.9 % (flush) injection syringe: INTRAVENOUS | @ 09:00:00

## 2016-08-16 NOTE — Nurses Notes (Addendum)
[id number]  Tylenol 650mg  po and benadryl 25mg  po given.  Pt denies complaints. Shirlee LatchMelissa Floyd, RN  [phone]- IVIG started at 25mg /ml for 15 minutes.   0912: IVIG increased to 50 mL/hour over 15 minutes. BP 124/65   Pulse 69   Temp 36.7 C (98 F)   Resp 18   SpO2 100%  0930  VS 65-16-125/60-100%RA.  Pt denies complaints.  IVIG increased to 17100ml/hr for 15min. Shirlee LatchMelissa Floyd, RN  [phone]: IVIG increased to 200 ml/h over the remainder of the infusion. VS: 100%, 65 bpm, 124/60. Linde Gillisourtney Lynn Watkins, RN  1300- IVIG complete. Iv flushed with 10cc NS. IV removed with canula intact. No complaints pt tol tx well. D/c ambulatory. Moreen FowlerAlexis Aryaan Persichetti, RN

## 2016-08-16 NOTE — Telephone Encounter (Addendum)
Orders received from Dr. Pearletha ForgeAl-Qudah's office with orders for IVIG. There are no notes in the patient's chart from this MD that I am able to see, nor are there any notes from neurology. Per appointments tab, [facility] and [facility]. The authorization for this medication, per chart, expires tomorrow. I spoke with Hailey at Dr. Pearletha ForgeAl-Qudah's office for clarification. There has been some issues with the patient's insurance, and if the patient needs to receive IVIG here, she will refax the orders. As only 3 sheets of the 6 page fax was received, and the 3rd page was blurry, I discarded the papers in the confidentiality bin. CIannamorelli RN

## 2016-08-22 ENCOUNTER — Inpatient Hospital Stay
Admission: RE | Admit: 2016-08-22 | Discharge: 2016-08-22 | Disposition: A | Discharge status: Still In Hospital | Payer: Managed Care, Other (non HMO) | Source: Ambulatory Visit | Attending: Neurology with Special Qualifications in Child Neurology | Admitting: Neurology with Special Qualifications in Child Neurology

## 2016-08-22 DIAGNOSIS — M3500 Sicca syndrome, unspecified: Secondary | ICD-10-CM

## 2016-08-22 MED ORDER — ACETAMINOPHEN 325 MG TABLET
650.0000 mg | ORAL_TABLET | Freq: Once | ORAL | Status: AC
Start: 2016-08-22 — End: 2016-08-22
  Administered 2016-08-22: 650 mg via ORAL
  Filled 2016-08-22: qty 2

## 2016-08-22 MED ORDER — IMMUNE GLOB,GAMMA (IGG) 10 %-GLY-IGA OVER 50 MCG/ML INJECTION SOLUTION
70.0000 g | Freq: Once | INTRAMUSCULAR | Status: AC
Start: 2016-08-22 — End: 2016-08-22
  Administered 2016-08-22: 0 g via INTRAVENOUS
  Administered 2016-08-22: 70 g via INTRAVENOUS
  Filled 2016-08-22: qty 700

## 2016-08-22 MED ORDER — DIPHENHYDRAMINE 25 MG CAPSULE
25.0000 mg | ORAL_CAPSULE | Freq: Once | ORAL | Status: AC
Start: 2016-08-22 — End: 2016-08-22
  Administered 2016-08-22: 25 mg via ORAL
  Filled 2016-08-22: qty 1

## 2016-08-22 MED ADMIN — immune glob,gamma (IgG) 10 %-gly-IgA over 50 mcg/mL injection solution: INTRAVENOUS | @ 09:00:00

## 2016-08-22 NOTE — Nurses Notes (Addendum)
[id number] Tylenol and benadryl po given. IVIG started at 9825ml/hr via piv. br noted.   0940 BP (!) 115/57   Pulse 65   Temp 36.3 C (97.3 F)   Resp 18   SpO2 100% tol well rate increased to 4450ml/hr.   0955 BP (!) 119/54   Pulse 60   Temp 36.4 C (97.6 F)   Resp 18   SpO2 100% tol well rate incresed to 7890ml/hr for duration per order to infuse over 8 hours.   1728 - Infusion complete. VS 122/62-66-18-100% O2 SAT. Denies complaints. PIV flushed with 10 ml NS prior to discontinuing with cannula intact. Gauze and coban drsg applied. Tolerated infusion well and discharged ambulatory without complaints.Blanchard Kelchavid Dayyan Krist, RN

## 2016-08-23 ENCOUNTER — Ambulatory Visit (HOSPITAL_COMMUNITY): Payer: Managed Care, Other (non HMO)

## 2016-08-30 ENCOUNTER — Encounter (HOSPITAL_COMMUNITY): Payer: Self-pay

## 2016-08-30 ENCOUNTER — Inpatient Hospital Stay
Admission: RE | Admit: 2016-08-30 | Discharge: 2016-08-30 | Disposition: A | Discharge status: Still In Hospital | Payer: Managed Care, Other (non HMO) | Source: Ambulatory Visit | Attending: Neurology with Special Qualifications in Child Neurology | Admitting: Neurology with Special Qualifications in Child Neurology

## 2016-08-30 DIAGNOSIS — G6289 Other specified polyneuropathies: Secondary | ICD-10-CM | POA: Insufficient documentation

## 2016-08-30 DIAGNOSIS — Z23 Encounter for immunization: Secondary | ICD-10-CM | POA: Insufficient documentation

## 2016-08-30 DIAGNOSIS — M3500 Sicca syndrome, unspecified: Secondary | ICD-10-CM | POA: Insufficient documentation

## 2016-08-30 MED ORDER — FLU VACCINE QS2017-18(36 MOS UP)(PF)60 MCG(15 MCGX4)/0.5 ML IM SYRINGE
0.5000 mL | INJECTION | Freq: Once | INTRAMUSCULAR | Status: AC
Start: 2016-08-30 — End: 2016-08-30
  Administered 2016-08-30: 0.5 mL via INTRAMUSCULAR
  Filled 2016-08-30: qty 1

## 2016-08-30 MED ORDER — ACETAMINOPHEN 325 MG TABLET
650.0000 mg | ORAL_TABLET | Freq: Once | ORAL | Status: AC
Start: 2016-08-30 — End: 2016-08-30
  Administered 2016-08-30: 650 mg via ORAL
  Filled 2016-08-30: qty 2

## 2016-08-30 MED ORDER — DIPHENHYDRAMINE 25 MG CAPSULE
25.0000 mg | ORAL_CAPSULE | Freq: Once | ORAL | Status: AC
Start: 2016-08-30 — End: 2016-08-30
  Administered 2016-08-30: 25 mg via ORAL
  Filled 2016-08-30: qty 1

## 2016-08-30 MED ORDER — IMMUNE GLOB,GAMMA (IGG) 10 %-GLY-IGA OVER 50 MCG/ML INJECTION SOLUTION
70.0000 g | Freq: Once | INTRAMUSCULAR | Status: AC
Start: 2016-08-30 — End: 2016-08-30
  Administered 2016-08-30 (×4): 70 g via INTRAVENOUS
  Filled 2016-08-30: qty 700

## 2016-08-30 MED ORDER — METHYLPREDNISOLONE SOD SUCCINATE 40 MG/ML SOLUTION FOR INJ. WRAPPER
40.0000 mg | Freq: Once | INTRAMUSCULAR | Status: AC
Start: 2016-08-30 — End: 2016-08-30
  Administered 2016-08-30: 40 mg via INTRAVENOUS
  Filled 2016-08-30: qty 1

## 2016-08-30 NOTE — Nurses Notes (Addendum)
[id number]  Tylenol 650mg  po and Benadryl 25mg  po given.  Flu shot to right deltoid and band aid applied.  Solu Medrol 40mg  IVP given slowly via adapter.  Adapter flushed with 10ml NS.  Pt denies complaints. Shirlee LatchMelissa Floyd, RN  [phone]  IVIG 70g started at 4725ml/hr for 15 min.  Pt denies complaints. Shirlee LatchMelissa Floyd, RN  [phone] pt has no complaints. VS 97.6-66-16-114/58-95%RA. IVIG increased to 7350ml/hr for 15mins. Tillie Fantasiaobin Shingleton, RN  1027: Voices no c/o, VS-97.9-116/50-41-18-99%RA, rate increased to 18200ml/hr. Burgess Amorebra Wilhelm, RN  1043  VS 97.9-82-16-114/61-99%RA.  Pt denies complaints.  IVIG increased to 14225ml/hr for remainder of infusion per pt request.  Shirlee LatchMelissa Floyd, RN  [phone]: Infusion complete. IV flushed with 3ml ns, removed intact, gauze secured with coban. Pt states she will begin doing her IVIG infusions at home with home health. Pt discharged ambulatory without c/o. Hortencia PilarWendy Jhonny Calixto, RN

## 2016-09-23 DIAGNOSIS — D803 Selective deficiency of immunoglobulin G [IgG] subclasses: Secondary | ICD-10-CM

## 2016-09-23 DIAGNOSIS — S46811S Strain of other muscles, fascia and tendons at shoulder and upper arm level, right arm, sequela: Secondary | ICD-10-CM

## 2016-09-23 DIAGNOSIS — R609 Edema, unspecified: Secondary | ICD-10-CM

## 2016-09-23 DIAGNOSIS — E039 Hypothyroidism, unspecified: Secondary | ICD-10-CM

## 2016-09-23 DIAGNOSIS — E559 Vitamin D deficiency, unspecified: Secondary | ICD-10-CM

## 2016-10-08 ENCOUNTER — Other Ambulatory Visit (HOSPITAL_BASED_OUTPATIENT_CLINIC_OR_DEPARTMENT_OTHER): Payer: Self-pay | Admitting: Adult Reconstructive Orthopaedic Surgery

## 2016-10-08 DIAGNOSIS — M1711 Unilateral primary osteoarthritis, right knee: Secondary | ICD-10-CM

## 2016-10-09 ENCOUNTER — Encounter (HOSPITAL_BASED_OUTPATIENT_CLINIC_OR_DEPARTMENT_OTHER): Payer: Self-pay | Admitting: Adult Reconstructive Orthopaedic Surgery

## 2016-10-09 ENCOUNTER — Ambulatory Visit
Admission: RE | Admit: 2016-10-09 | Discharge: 2016-10-09 | Disposition: A | Discharge status: Home / Self Care | Payer: Managed Care, Other (non HMO) | Source: Ambulatory Visit | Attending: Adult Reconstructive Orthopaedic Surgery | Admitting: Adult Reconstructive Orthopaedic Surgery

## 2016-10-09 ENCOUNTER — Ambulatory Visit (INDEPENDENT_AMBULATORY_CARE_PROVIDER_SITE_OTHER): Payer: Managed Care, Other (non HMO) | Admitting: Adult Reconstructive Orthopaedic Surgery

## 2016-10-09 VITALS — BP 130/70 | Ht 67.0 in | Wt 156.0 lb

## 2016-10-09 DIAGNOSIS — Z87891 Personal history of nicotine dependence: Secondary | ICD-10-CM | POA: Insufficient documentation

## 2016-10-09 DIAGNOSIS — M1711 Unilateral primary osteoarthritis, right knee: Principal | ICD-10-CM | POA: Insufficient documentation

## 2016-10-09 DIAGNOSIS — E039 Hypothyroidism, unspecified: Secondary | ICD-10-CM | POA: Insufficient documentation

## 2016-10-09 DIAGNOSIS — M25561 Pain in right knee: Secondary | ICD-10-CM | POA: Insufficient documentation

## 2016-10-09 DIAGNOSIS — M3500 Sicca syndrome, unspecified: Secondary | ICD-10-CM | POA: Insufficient documentation

## 2016-10-09 NOTE — H&P (Signed)
[facility]  [address]  [place] [address]  Dept: [phone]  Dept Fax: [phone]    OFFICE VISIT    PATIENT NAME:  Tracie [name]  MEDICAL RECORD NUMBER: [id number]    DICTATING PHYSICIAN:  Lovena Le, DO  REFERRING PHYSICIAN:  Royston Cowper, [place]    DOB:  09-25-1966  DOS:  10/09/2016    CHIEF COMPLAINT:  Right Knee Pain      HISTORY OF PRESENT ILLNESS:  Tracie [name] is a 50 y.o. female.  Comes to the office today for initial consultation in regards to ongoing Right knee pain.  Patient states that this has been ongoing for many "years".  she can not recall one specific injury that caused her current pain.    She did have an accident when she was 16 that resulted in a traumatic laceration over the knee.  She did not recall having any bony trauma at that time.  Patient tells me that their symptoms are worse with weight bearing activities and ambulating on uneven ground and downward grades.  she reports pain that awakens them from sleeping. In addition to modifying the activities that create pain, she has attempted Motrin and viscosuplementation.  She has seen a orthopedic surgeon at [facility] for her symptoms.  At this point the patient is here today to discuss further evaluation of their knee pain.     PAST MEDICAL HISTORY:  Past Medical History:   Diagnosis Date    Abnormal Pap smear 2001    LSIL    Acute neck pain 2014     muscle relaxant med    Breast cyst     bilateral, multiple, variable sizes.     Chest pain 01/28/2008    work-up negative    Dyspepsia 2014    Zantac med    Dysplasia of cervix 2001    Colpo CIN 1    Hypothyroidism     Irregular heart beat     Psychosocial stressors 2013 - 2014     Sjogren's disease     Unspecified breast disorder [id number] - present     large breast cysts            PAST SURGICAL HISTORY:  Past Surgical History:   Procedure Laterality Date    BIOPSY BREAST  10/12/03; 11/03/07,  04/28/2012     left breast  aspirations x 2 - 2004 - 15 ml turbid and 2009 - 3 ml ; 04/28/2012 Right with rare calcifications.    HX BREAST BIOPSY Right     needle bx.  age 89    HX CYST INCISION AND DRAINAGE Left     aspiration    HX TONSILLECTOMY  1974           MEDICATIONS:  Current Outpatient Prescriptions   Medication Sig    aMILoride-hydrochlorothiazide (MODURETIC) 5-50 mg Oral Tablet Take 1 Tab by mouth Once a day    Ascorbic Acid (VITAMIN C) 1,000 mg Oral Tablet Take 1,000 mg by mouth Once a day    cholecalciferol, vitamin D3, 1,000 unit Oral Tablet Take 1,000 Units by mouth Once a day Patient takes 2000 IU nightly    cyanocobalamin (VITAMIN B12) 250 mcg Oral Tablet Take 250 mcg by mouth Four times daily with food    cycloSPORINE (RESTASIS) 0.05 % Ophthalmic Dropperette Instill 1 Drop into both eyes Every 12 hours    DULoxetine (CYMBALTA DR) 30 mg Oral Capsule, Delayed Release(E.C.) Take  60 mg by mouth Once a day    flecainide (TAMBOCOR) 50 mg Oral Tablet Take 50 mg by mouth Twice daily    gabapentin (NEURONTIN) 300 mg Oral Capsule Take 300 mg by mouth Once a day    Glucosamine-Chondroit-Vit C-Mn (GLUCOSAMINE 1500 COMPLEX) 500-400 mg Oral Capsule Take by mouth    Green Tea Leaf Extract (GREEN TEA) Oral Capsule Take by mouth    hydroCHLOROthiazide (MICROZIDE) 12.5 mg Oral Capsule Take 12.5 mg by mouth Once a day    linaclotide (LINZESS) 145 mcg Oral Capsule Take 145 mcg by mouth Once a day    liothyronine (CYTOMEL) 25 mcg Oral Tablet Take 25 mcg by mouth Once a day    magnesium citrate (CITROMA) Oral Solution Take 296 mL by mouth One time Reported on 02/01/2016    medroxyPROGESTERone (PROVERA) 10 mg Oral Tablet Take 1 Tab (10 mg total) by mouth Once a day    Miscellaneous Medical Supply Misc by Does not apply route TRI Chromolean supplement    Pilocarpine HCl 7.5 mg Oral Tablet Take 1 Tab (7.5 mg total) by mouth Twice daily    Potassium 99 mg Tab take by mouth. 2 daily    prenatal vitamin-iron-folate Tablet Take 1 Tab  by mouth Once a day    raNITIdine (ZANTAC) 300 mg Oral Tablet Take 300 mg by mouth Every evening    thyroid (ARMOUR THYROID) 60 mg Oral Tablet Take 60 mg by mouth Once a day    VIT E-TOCOTRIENOL GAM,ALPH,DEL ORAL Take by mouth Patient takes 400 IU daily    Zinc 50 mg Tab take by mouth.        ALLERGIES:  Allergy History as of 10/09/16     TETRACYCLINE       Noted Status Severity Type Reaction    01/27/08 Janeann MerlHickman, Heather, RN 01/27/08 Active                   FAMILY HISTORY:  Family Medical History     Problem Relation (Age of Onset)    Breast Cancer Maternal Aunt    Cancer Son    Diabetes Paternal Grandmother    Healthy Mother    Heart Disease Paternal Grandfather, Father    Hypertension Paternal Grandfather, Father    Leukemia Maternal Uncle    Other Mother, Sister    Uterine Fibroids Mother              SOCIAL HISTORY:  Social History     Social History    Marital status: Married     Spouse name: Kendell Baneroy    Number of children: 3    Years of education: 12     Occupational History    Office  Clerk      [employer]      sp:  Programmer, multimediaostal service spvr     Social History Main Topics    Smoking status: Former Smoker     Packs/day: 0.25     Years: 2.00     Types: Cigarettes     Quit date: 10/22/1991    Smokeless tobacco: Never Used    Alcohol use No    Drug use: No    Sexual activity: Yes     Partners: Male     Birth control/ protection: Vasectomy     Other Topics Concern    Abuse/Domestic Violence No    Breast Self Exam Yes    Caffeine Concern No  Calcium Intake Adequate No    Computer Use Yes    Exercise Concern No    Helmet Use Yes    Seat Belt Yes    Special Diet Yes     low sodium diet    Sunscreen Used No    Uses Gait Assitive Device (Cane, Environmental consultantWalker, Catering managertc) No    Right Hand Dominant Yes    Left Hand Dominant No    Ambidextrous No     Social History Narrative       REVIEW OF SYSTEMS  Constitutional: negative  Musculoskeletal:positive for arthralgias and stiff joints  All other ROS  Negative    PHYSICAL EXAMINATION:    BP 130/70   Ht 1.702 [name] (5\' 7" )   Wt 70.8 kg (156 lb)   BMI 24.43 kg/m2    GENERAL: she is alert, cooperative, no distress, appears stated age.      Orthopedic Evaluation:    Patient displays a   Antalgic gait favoring the right side.    Right hip shows a full ROM, (Forward flexion 0-115 degrees, internal rotation 0-35 degrees, external rotation 0-45 degrees). FABRE testing negative. No tenderness to palpation over the greater trochanter.     Left hip shows a full ROM, (Forward flexion 0-115 degrees, internal rotation 0-35 degrees, external rotation 0-45 degrees). FABRE testing negative. No tenderness to palpation over the greater trochanter.    Right knee   Alignment shows increased valgus posturing.  She has a painful range of motion of 0-110 degrees.  Majority of pain found on palpation over the lateral aspect.  Trace effusion palpated.  CLarke's patellofemoral compression testing positive for crepitus and pain.  Ligamental testing was stable pseudo laxity was found with LCL stress testing.  Left knee   Alignment neutral.  Range of motion is full.  No pain on palpation over the joint line.  Patella tracks midline.  Ligamental testing stable.  Right Ankle shows a full functional range of motion.   No instability of deformity noted.Skin is clear.   Left Ankle shows a full functional range of motion.   No instability of deformity noted.Skin is clear.   Neurovascular exam completed on both extremities shows palpable dorsalis pedis pulses with brisk cappilary refill noted. Deep tendon reflexes are present.  Overall muscle definition is symmetric without focal neurologic deficiency.    Skin inspection is clear.          IMAGING:    X-rays were brought up in room and reviewed with patient and husband.  Knee pathology identified and reviewed with both.    Patient ID: [id number]   Patient Name: Tracie [name], Tracie [name]   Birth Date: 01-29-1966   Sex: F    Exam Date : 10-09-2016 08:36   Accession  Number: [id number]    Procedure Code/Description: [id number] / XR KNEE RIGHT 4 OR MORE VIEWS  Ordering Physician/Service: Lovena LeOURTNEY, Dalin Caldera    Reason For Exam : Osteoarthritis of right knee, unspecified osteoarthritis type        Nikkita [name] Puthoff    PROCEDURE DESCRIPTION: XR KNEE RIGHT 4 OR MORE VIEWS    PROCEDURE PERFORMED DATE AND TIME: 10/09/2016 8:43 AM    CLINICAL INDICATION: M17.11: Osteoarthritis of right knee, unspecified  osteoarthritis type    TECHNIQUE: 4 views / 4 images submitted.    COMPARISON: No prior studies were compared.      FINDINGS:     AP weight bearing, AP 45 degrees flexion, lateral weight bearing and  sunrise patellar views  were obtained. Lateral knee joint compartment  narrowing is seen with marginal osteophytes and subchondral eburnation and  irregularity of the posterior aspect lateral facet patella is seen but may  simply be normal variation combined with adjacent osteophyte. No  significant effusion seen. No fractures or dislocations are seen.      IMPRESSION:  Moderate osteoarthritis.    Authored by : Margot Chimes      IMPRESSION:      ICD-10-CM    1. Primary osteoarthritis of right knee M17.11        PLAN:    At this point I have discussed with the patient today my evaluation and recommended treatment alternatives.  I feel Tracie [name] has a firm understanding of the discussed pathology.   She has tried activity modifications, daily NSAIDs, and previous Visco supplementation.  None of these conservative treatments have a helped her in regards to pain or improving her everyday activities.  She was to plan for a right total knee replacement.  She will gain preop medical clearance from her rheumatologist as well as her primary care provider.  With tele set a date for December 10, 2016. She will return 2 weeks prior to this for preoperative exam.  I have answered all questions directed by the patient.

## 2016-10-25 ENCOUNTER — Other Ambulatory Visit (HOSPITAL_BASED_OUTPATIENT_CLINIC_OR_DEPARTMENT_OTHER): Payer: Self-pay | Admitting: Adult Reconstructive Orthopaedic Surgery

## 2016-10-25 DIAGNOSIS — M1711 Unilateral primary osteoarthritis, right knee: Secondary | ICD-10-CM

## 2016-10-28 ENCOUNTER — Other Ambulatory Visit (HOSPITAL_COMMUNITY): Payer: Self-pay | Admitting: Family

## 2016-10-28 ENCOUNTER — Other Ambulatory Visit (INDEPENDENT_AMBULATORY_CARE_PROVIDER_SITE_OTHER): Payer: Self-pay | Admitting: Family

## 2016-10-28 DIAGNOSIS — Z1239 Encounter for other screening for malignant neoplasm of breast: Secondary | ICD-10-CM

## 2016-10-31 ENCOUNTER — Ambulatory Visit (HOSPITAL_BASED_OUTPATIENT_CLINIC_OR_DEPARTMENT_OTHER)
Admission: RE | Admit: 2016-10-31 | Discharge: 2016-10-31 | Disposition: A | Discharge status: Home / Self Care | Payer: Managed Care, Other (non HMO) | Source: Ambulatory Visit | Attending: Family | Admitting: Family

## 2016-10-31 ENCOUNTER — Encounter (HOSPITAL_COMMUNITY): Payer: Self-pay

## 2016-10-31 ENCOUNTER — Ambulatory Visit
Admission: RE | Admit: 2016-10-31 | Discharge: 2016-10-31 | Disposition: A | Discharge status: Home / Self Care | Payer: Managed Care, Other (non HMO) | Source: Ambulatory Visit | Attending: Family | Admitting: Family

## 2016-10-31 ENCOUNTER — Other Ambulatory Visit (INDEPENDENT_AMBULATORY_CARE_PROVIDER_SITE_OTHER): Payer: Self-pay | Admitting: Family

## 2016-10-31 DIAGNOSIS — R928 Other abnormal and inconclusive findings on diagnostic imaging of breast: Secondary | ICD-10-CM | POA: Insufficient documentation

## 2016-10-31 DIAGNOSIS — Z1231 Encounter for screening mammogram for malignant neoplasm of breast: Secondary | ICD-10-CM | POA: Insufficient documentation

## 2016-10-31 DIAGNOSIS — N6012 Diffuse cystic mastopathy of left breast: Secondary | ICD-10-CM

## 2016-10-31 DIAGNOSIS — Z1239 Encounter for other screening for malignant neoplasm of breast: Secondary | ICD-10-CM

## 2016-10-31 DIAGNOSIS — N6001 Solitary cyst of right breast: Secondary | ICD-10-CM | POA: Insufficient documentation

## 2016-11-19 DIAGNOSIS — Z01818 Encounter for other preprocedural examination: Secondary | ICD-10-CM

## 2016-11-19 DIAGNOSIS — M1711 Unilateral primary osteoarthritis, right knee: Secondary | ICD-10-CM

## 2016-11-27 ENCOUNTER — Ambulatory Visit
Admission: RE | Admit: 2016-11-27 | Discharge: 2016-11-27 | Disposition: A | Discharge status: Home / Self Care | Payer: Managed Care, Other (non HMO) | Source: Ambulatory Visit | Attending: Medical | Admitting: Medical

## 2016-11-27 ENCOUNTER — Ambulatory Visit (INDEPENDENT_AMBULATORY_CARE_PROVIDER_SITE_OTHER): Payer: Managed Care, Other (non HMO) | Admitting: Medical

## 2016-11-27 ENCOUNTER — Ambulatory Visit (HOSPITAL_COMMUNITY): Payer: Managed Care, Other (non HMO)

## 2016-11-27 ENCOUNTER — Ambulatory Visit (HOSPITAL_COMMUNITY)
Admission: RE | Admit: 2016-11-27 | Discharge: 2016-11-27 | Disposition: A | Discharge status: Home / Self Care | Payer: Managed Care, Other (non HMO) | Source: Ambulatory Visit | Attending: Medical | Admitting: Medical

## 2016-11-27 VITALS — Ht 67.0 in | Wt 155.0 lb

## 2016-11-27 DIAGNOSIS — I471 Supraventricular tachycardia: Secondary | ICD-10-CM

## 2016-11-27 DIAGNOSIS — E785 Hyperlipidemia, unspecified: Secondary | ICD-10-CM

## 2016-11-27 DIAGNOSIS — M3500 Sicca syndrome, unspecified: Secondary | ICD-10-CM

## 2016-11-27 DIAGNOSIS — M79609 Pain in unspecified limb: Secondary | ICD-10-CM

## 2016-11-27 DIAGNOSIS — R52 Pain, unspecified: Secondary | ICD-10-CM

## 2016-11-27 DIAGNOSIS — M1711 Unilateral primary osteoarthritis, right knee: Secondary | ICD-10-CM

## 2016-11-27 LAB — CBC
HCT: 40.6 % (ref 34.6–46.2)
HGB: 14 g/dL (ref 11.8–15.8)
MCH: 29.1 pg (ref 27.6–33.2)
MCHC: 34.5 g/dL (ref 32.6–35.4)
MCV: 84.5 fL (ref 82.3–96.7)
MPV: 10.2 fL (ref 6.6–10.2)
PLATELETS: 153 x10?3/uL (ref 140–440)
RBC: 4.8 x10ˆ6/uL (ref 3.80–5.24)
RDW: 14 % (ref 12.4–15.2)
WBC: 4.8 10*3/uL (ref 3.5–10.3)
WBC: 4.8 x10?3/uL (ref 3.5–10.3)

## 2016-11-27 LAB — URINALYSIS, MICROSCOPIC

## 2016-11-27 LAB — COMPREHENSIVE METABOLIC PANEL, NON-FASTING
ALBUMIN: 3.7 g/dL (ref 3.5–4.8)
ALKALINE PHOSPHATASE: 62 U/L (ref 20–130)
ALT (SGPT): 37 U/L (ref 7–42)
ANION GAP: 8 mmol/L
AST (SGOT): 36 U/L — ABNORMAL HIGH (ref 12–35)
BILIRUBIN TOTAL: 0.8 mg/dL (ref 0.3–1.2)
BUN/CREA RATIO: 30
BUN: 21 mg/dL — ABNORMAL HIGH (ref 8–20)
CALCIUM: 9.4 mg/dL (ref 8.9–10.3)
CHLORIDE: 103 mmol/L (ref 101–111)
CO2 TOTAL: 28 mmol/L (ref 22–32)
CREATININE: 0.7 mg/dL (ref 0.6–1.2)
CREATININE: 0.7 mg/dL (ref 0.6–1.2)
ESTIMATED GFR: 88 mL/min/1.73m?2
GLUCOSE: 78 mg/dL (ref 70–110)
POTASSIUM: 3.4 mmol/L — ABNORMAL LOW (ref 3.6–5.1)
POTASSIUM: 3.4 mmol/L — ABNORMAL LOW (ref 3.6–5.1)
PROTEIN TOTAL: 7.4 g/dL (ref 6.4–8.3)
SODIUM: 139 mmol/L (ref 136–144)

## 2016-11-27 LAB — ECG 12 LEAD - ADULT
Calculated P Axis: 75 deg
QT Interval: 384 ms
QTC Calculation: 409 ms
ST Axis: 98 deg
T 40 Axis: 20 deg

## 2016-11-27 LAB — URINALYSIS, MACROSCOPIC
BILIRUBIN: NEGATIVE mg/dL
BLOOD: NEGATIVE mg/dL
BLOOD: NEGATIVE mg/dL
GLUCOSE: NEGATIVE mg/dL
GLUCOSE: NEGATIVE mg/dL
LEUKOCYTES: NEGATIVE WBCs/uL
PH: 6.5 (ref 5.0–7.0)
SPECIFIC GRAVITY: 1.023 (ref 1.010–1.025)

## 2016-11-27 LAB — MRSA COLONIZATION SCREEN, PCR: MRSA COLONIZATION SCREEN: NEGATIVE

## 2016-11-27 LAB — PT/INR: INR: 0.96 (ref 0.90–1.10)

## 2016-11-27 LAB — C-REACTIVE PROTEIN(CRP),INFLAMMATION: CRP INFLAMMATION: <0.5 mg/L (ref <=0.9)

## 2016-11-27 LAB — SEDIMENTATION RATE: ERYTHROCYTE SEDIMENTATION RATE (ESR): 48 mm/h — ABNORMAL HIGH (ref 0–30)

## 2016-11-27 NOTE — H&P (Signed)
Right TKA Pre-Op    Patient: Tracie PigeonDianne M Heidler  Chart #: [id number]  DOB: 1966/02/27  DOS:  11/27/2016     Chief Complaint:   Chief Complaint   Patient presents with   . Pre-op     rtka sx 12-10-16       History of Present Illness:  Mrs. Reita ChardRickles  is a pleasant 51 y.o. year-old female who comes to the office today in preoperative evaluation in regards to her upcoming right total knee arthroplasty which is scheduled for 12/10/2016.  Patient states that she has to continued to have right knee pain despite conservative measures.  She has tried non-steroidal anti-inflammatories, corticosteroid injections and activity modification.     PAST MEDICAL HISTORY:  Past Medical History:   Diagnosis Date   . Abnormal Pap smear 2001    LSIL   . Acute neck pain 2014     muscle relaxant med   . Breast cyst     bilateral, multiple, variable sizes.    . Chest pain 01/28/2008    work-up negative   . Dyspepsia 2014    Zantac med   . Dysplasia of cervix 2001    Colpo CIN 1   . Hypothyroidism    . Irregular heart beat    . Psychosocial stressors 2013 - 2014    . Sjogren's disease    . Unspecified breast disorder [id number] - present     large breast cysts            PAST SURGICAL HISTORY:  Past Surgical History:   Procedure Laterality Date   . BIOPSY BREAST  10/12/03; 11/03/07,  04/28/2012     left breast aspirations x 2 - 2004 - 15 ml turbid and 2009 - 3 ml ; 04/28/2012 Right with rare calcifications.   . HX BREAST BIOPSY Right     needle bx.  age 51   . HX CYST INCISION AND DRAINAGE Left     aspiration   . HX TONSILLECTOMY  1974           MEDICATION ALLERGIES:    Outpatient Medications Prior to Visit:  aMILoride-hydrochlorothiazide (MODURETIC) 5-50 mg Oral Tablet Take 1 Tab by mouth Once a day   Ascorbic Acid (VITAMIN C) 1,000 mg Oral Tablet Take 1,000 mg by mouth Once a day   cholecalciferol, vitamin D3, 1,000 unit Oral Tablet Take 1,000 Units by mouth Once a day Patient takes 2000 IU nightly   cyanocobalamin (VITAMIN B12) 250 mcg Oral Tablet Take 250  mcg by mouth Four times daily with food   cycloSPORINE (RESTASIS) 0.05 % Ophthalmic Dropperette Instill 1 Drop into both eyes Every 12 hours   DULoxetine (CYMBALTA DR) 30 mg Oral Capsule, Delayed Release(E.C.) Take 60 mg by mouth Once a day   flecainide (TAMBOCOR) 50 mg Oral Tablet Take 50 mg by mouth Twice daily   gabapentin (NEURONTIN) 300 mg Oral Capsule Take 300 mg by mouth Once a day   Glucosamine-Chondroit-Vit C-Mn (GLUCOSAMINE 1500 COMPLEX) 500-400 mg Oral Capsule Take by mouth   Green Tea Leaf Extract (GREEN TEA) Oral Capsule Take by mouth   hydroCHLOROthiazide (MICROZIDE) 12.5 mg Oral Capsule Take 12.5 mg by mouth Once a day   linaclotide (LINZESS) 145 mcg Oral Capsule Take 145 mcg by mouth Once a day   liothyronine (CYTOMEL) 25 mcg Oral Tablet Take 25 mcg by mouth Once a day   magnesium citrate (CITROMA) Oral Solution Take 296 mL by mouth One time Reported on 02/01/2016  medroxyPROGESTERone (PROVERA) 10 mg Oral Tablet Take 1 Tab (10 mg total) by mouth Once a day   Miscellaneous Medical Supply Misc by Does not apply route TRI Chromolean supplement   Pilocarpine HCl 7.5 mg Oral Tablet Take 1 Tab (7.5 mg total) by mouth Twice daily   Potassium 99 mg Tab take by mouth. 2 daily   prenatal vitamin-iron-folate Tablet Take 1 Tab by mouth Once a day   raNITIdine (ZANTAC) 300 mg Oral Tablet Take 300 mg by mouth Every evening   thyroid (ARMOUR THYROID) 60 mg Oral Tablet Take 60 mg by mouth Once a day   VIT E-TOCOTRIENOL GAM,ALPH,DEL ORAL Take by mouth Patient takes 400 IU daily   Zinc 50 mg Tab take by mouth.      No facility-administered medications prior to visit.     CURRENT MEDICATIONS:    Current Outpatient Prescriptions:   .  aMILoride-hydrochlorothiazide (MODURETIC) 5-50 mg Oral Tablet, Take 1 Tab by mouth Once a day, Disp: , Rfl:   .  Ascorbic Acid (VITAMIN C) 1,000 mg Oral Tablet, Take 1,000 mg by mouth Once a day, Disp: , Rfl:   .  cholecalciferol, vitamin D3, 1,000 unit Oral Tablet, Take 1,000 Units by  mouth Once a day Patient takes 2000 IU nightly, Disp: , Rfl:   .  cyanocobalamin (VITAMIN B12) 250 mcg Oral Tablet, Take 250 mcg by mouth Four times daily with food, Disp: , Rfl:   .  cycloSPORINE (RESTASIS) 0.05 % Ophthalmic Dropperette, Instill 1 Drop into both eyes Every 12 hours, Disp: , Rfl:   .  DULoxetine (CYMBALTA DR) 30 mg Oral Capsule, Delayed Release(E.C.), Take 60 mg by mouth Once a day, Disp: , Rfl:   .  flecainide (TAMBOCOR) 50 mg Oral Tablet, Take 50 mg by mouth Twice daily, Disp: , Rfl:   .  gabapentin (NEURONTIN) 300 mg Oral Capsule, Take 300 mg by mouth Once a day, Disp: , Rfl:   .  Glucosamine-Chondroit-Vit C-Mn (GLUCOSAMINE 1500 COMPLEX) 500-400 mg Oral Capsule, Take by mouth, Disp: , Rfl:   .  Green Tea Leaf Extract (GREEN TEA) Oral Capsule, Take by mouth, Disp: , Rfl:   .  hydroCHLOROthiazide (MICROZIDE) 12.5 mg Oral Capsule, Take 12.5 mg by mouth Once a day, Disp: , Rfl:   .  linaclotide (LINZESS) 145 mcg Oral Capsule, Take 145 mcg by mouth Once a day, Disp: , Rfl:   .  liothyronine (CYTOMEL) 25 mcg Oral Tablet, Take 25 mcg by mouth Once a day, Disp: , Rfl:   .  magnesium citrate (CITROMA) Oral Solution, Take 296 mL by mouth One time Reported on 02/01/2016, Disp: , Rfl:   .  medroxyPROGESTERone (PROVERA) 10 mg Oral Tablet, Take 1 Tab (10 mg total) by mouth Once a day, Disp: 10 Tab, Rfl: 0  .  Miscellaneous Medical Supply Misc, by Does not apply route TRI Chromolean supplement, Disp: , Rfl:   .  Pilocarpine HCl 7.5 mg Oral Tablet, Take 1 Tab (7.5 mg total) by mouth Twice daily, Disp: 180 Tab, Rfl: 3  .  Potassium 99 mg Tab, take by mouth. 2 daily, Disp: , Rfl:   .  prenatal vitamin-iron-folate Tablet, Take 1 Tab by mouth Once a day, Disp: , Rfl:   .  raNITIdine (ZANTAC) 300 mg Oral Tablet, Take 300 mg by mouth Every evening, Disp: , Rfl:   .  thyroid (ARMOUR THYROID) 60 mg Oral Tablet, Take 60 mg by mouth Once a day, Disp: , Rfl:   .  VIT E-TOCOTRIENOL GAM,ALPH,DEL ORAL, Take by mouth Patient  takes 400 IU daily, Disp: , Rfl:   .  Zinc 50 mg Tab, take by mouth. , Disp: , Rfl:     SOCIAL HISTORY:  Social History     Social History   . Marital status: Married     Spouse name: Kendell Bane   . Number of children: 3   . Years of education: 12     Occupational History   . 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   .  [employer]      sp:  Postal service spvr     Social History Main Topics   . Smoking status: Former Smoker     Packs/day: 0.25     Years: 2.00     Types: Cigarettes     Quit date: 10/22/1991   . Smokeless tobacco: Never Used   . Alcohol use No   . Drug use: No   . Sexual activity: Yes     Partners: Male     Birth control/ protection: Vasectomy     Other Topics Concern   . Abuse/Domestic Violence No   . Breast Self Exam Yes   . Caffeine Concern No   . Calcium Intake Adequate No   . Computer Use Yes   . Exercise Concern No   . Helmet Use Yes   . Seat Belt Yes   . Special Diet Yes     low sodium diet   . Sunscreen Used No   . Uses Gait Assitive Device (Cane, Walker, Etc) No   . Right Hand Dominant Yes   . Left Hand Dominant No   . Ambidextrous No     Social History Narrative       Family history and review of systems are noted on the chart.    PHYSICAL EXAMINATION:  Vitals:    11/27/16 0920   Weight: 70.3 kg (155 lb)   Height: 1.702 m (5\' 7" )       Lung EXAM: Clear to auscultation bilaterally; no wheezes, no rhonchi, and no rales.  Cardiac EXAM:   Regular rate and rhythm; no murmurs, no rubs, and no gallops.  ABDOMINAL EXAM:  no tenderness.  no organomegaly.  Positive bowel sound throughout all four quadrants.    ORTHOPEDIC EVALUATION:    Patient displays an abnormal gait.  Patient favors the rightlower extremity.  Gross inspection reveals normal mechanical and anatomic axis of the lower extremities with no appreciable leg length discrepancy.  No tenderness found with AP or lateral compression testing of the pelvis.    Right hip shows full PROM (Forward flexion 0-115 degrees, internal rotation 0-35 degrees, external  rotation 0-45 degrees). McCarthy's Maneuvers are negative for impingement. FABRE testing negative. No tenderness to palpation over the greater trochanter.  Straight leg testing is negative for radiculopathy.  Compartments are supple.  Skin is clear.  +5/5 hip flexors/extenders, as well as, knee flexors and extenders.  Intact sensory exam along all dermatomes.    Left hip shows full PROM (Forward flexion 0-115 degrees, internal rotation 0-35 degrees, external rotation 0-45 degrees). McCarthy's Maneuvers are negative for impingement. FABRE testing negative. No tenderness to palpation over the greater trochanter.  Straight leg testing is negative for radiculopathy.  Compartments are supple.  Skin is clear.  +5/5 hip flexors/extenders, as well as, knee flexors and extenders.  Intact sensory exam along all dermatomes.    Right knee shows full PROM (0-120 degrees).  Patella tracks midline without instability.  Mild tenderness to palpation of the distal femur and proximal tibia. Clark's patella-femoral test is positivefor crepitus and pain.  No effusion is present.  Lachman's test is negative.  Posterior Drawer test is negative.  Collateral ligamental testing is stable at 30 degrees of extension, mid-flexion, and terminal flexion.  McMurrary testing is negative.  No Baker's cyst is present.  Skin is clear.  Normoreflexic.   Left knee shows full PROM (0-120 degrees).  Patella tracks midline without instability.  negative tenderness to palpation of the distal femur and proximal tibia. Clark's patella-femoral test is positivefor crepitus and pain.  No effusion is present.  Lachman's test is negative.  Posterior Drawer test is negative.  Collateral ligamental testing is stable at 30 degrees of extension, mid-flexion, and terminal flexion.  McMurrary testing is negative.  No Baker's cyst is present.  Skin is clear.  Normoreflexic.    Right Ankle shows full PROM in plantar and dorsiflexion.  Stable to ligamental testing.  Skin  is clear. +5/5 motor strength to plantar/dorsiflexion, inversion/eversion, and EHL.  Intact sensory to light-touch.  +2/4 dorsalis pedis and posterior tibial pulse.    Left Ankle shows full PROM in plantar and dorsiflexion.  Stable to ligamental testing.  Skin is clear. +5/5 motor strength to plantar/dorsiflexion, inversion/eversion, and EHL.  Intact sensory to light-touch.  +2/4 dorsalis pedis and posterior tibial pulse.             ASSESSMENT:  Right knee osteoarthritis    PLAN:  At this time I have discussed with Mrs. Hagerty in great detail today the risks, benefits, rationale, expected outcomes and potential complications of undergoing a right total knee arthroplasty.  She does understand the risk of surgery and has given [place] consent to proceed with surgical intervention.  A signed copy of the surgical consent has been signed and a copy has been placed on the chart.  I utilized greater than 30 mintues today discussing with her the procedure and postoperative rehabilitation process for a total knee replacement surgery.  She has received preoperative clearance from her primary care physician George Ina).  Also, preoperative labs have been completed including chest x-ray, EKG, CBC, CMP, nasal swab, urine with culture, and PT/INR.  Mrs. Hines will undergo right total knee arthroplasty on 12/10/2016.  We discussed DVT prophylaxis following surgery which may include Lovenox 30mg /66ml twice a day for 14 days to prevent blood clots.  (He She) will follow up with Dr. Toni Amend 3 weeks after surgery for standard postoperative evaluation.  All of her questions were answered to her satisfaction today.    Note reviewed. No changes or addition.  Agree with current plan.      Nile Dear. Jaci Carrel.O.

## 2016-11-29 LAB — URINE CULTURE: URINE CULTURE: 20000 — AB

## 2016-12-04 ENCOUNTER — Encounter (INDEPENDENT_AMBULATORY_CARE_PROVIDER_SITE_OTHER): Payer: Self-pay | Admitting: Family

## 2016-12-04 DIAGNOSIS — E039 Hypothyroidism, unspecified: Secondary | ICD-10-CM

## 2016-12-04 DIAGNOSIS — I341 Nonrheumatic mitral (valve) prolapse: Secondary | ICD-10-CM

## 2016-12-04 DIAGNOSIS — J3089 Other allergic rhinitis: Secondary | ICD-10-CM

## 2016-12-04 DIAGNOSIS — E559 Vitamin D deficiency, unspecified: Secondary | ICD-10-CM | POA: Insufficient documentation

## 2016-12-04 DIAGNOSIS — D803 Selective deficiency of immunoglobulin G [IgG] subclasses: Secondary | ICD-10-CM

## 2016-12-04 DIAGNOSIS — Z0000 Encounter for general adult medical examination without abnormal findings: Secondary | ICD-10-CM | POA: Insufficient documentation

## 2016-12-04 DIAGNOSIS — M7070 Other bursitis of hip, unspecified hip: Secondary | ICD-10-CM | POA: Insufficient documentation

## 2016-12-04 DIAGNOSIS — K5909 Other constipation: Secondary | ICD-10-CM

## 2016-12-04 DIAGNOSIS — G629 Polyneuropathy, unspecified: Secondary | ICD-10-CM

## 2016-12-04 HISTORY — DX: Other allergic rhinitis: J30.89

## 2016-12-04 HISTORY — DX: Nonrheumatic mitral (valve) prolapse: I34.1

## 2016-12-05 ENCOUNTER — Encounter (HOSPITAL_COMMUNITY): Payer: Self-pay

## 2016-12-10 ENCOUNTER — Encounter (HOSPITAL_COMMUNITY): Payer: Self-pay

## 2016-12-10 ENCOUNTER — Ambulatory Visit (HOSPITAL_COMMUNITY): Payer: Managed Care, Other (non HMO) | Admitting: Adult Reconstructive Orthopaedic Surgery

## 2016-12-10 ENCOUNTER — Inpatient Hospital Stay (HOSPITAL_COMMUNITY): Payer: Managed Care, Other (non HMO) | Admitting: Anesthesiology

## 2016-12-10 ENCOUNTER — Encounter (HOSPITAL_COMMUNITY)
Admission: RE | Disposition: A | Discharge status: Home Health | Payer: Self-pay | Source: Ambulatory Visit | Attending: Adult Reconstructive Orthopaedic Surgery

## 2016-12-10 ENCOUNTER — Ambulatory Visit
Admission: RE | Admit: 2016-12-10 | Discharge: 2016-12-11 | Disposition: A | Discharge status: Home Health | Payer: Managed Care, Other (non HMO) | Source: Ambulatory Visit | Attending: Adult Reconstructive Orthopaedic Surgery | Admitting: Adult Reconstructive Orthopaedic Surgery

## 2016-12-10 ENCOUNTER — Inpatient Hospital Stay: Payer: Managed Care, Other (non HMO)

## 2016-12-10 DIAGNOSIS — M1711 Unilateral primary osteoarthritis, right knee: Secondary | ICD-10-CM

## 2016-12-10 DIAGNOSIS — E039 Hypothyroidism, unspecified: Secondary | ICD-10-CM | POA: Insufficient documentation

## 2016-12-10 DIAGNOSIS — G8918 Other acute postprocedural pain: Secondary | ICD-10-CM

## 2016-12-10 DIAGNOSIS — E785 Hyperlipidemia, unspecified: Secondary | ICD-10-CM | POA: Insufficient documentation

## 2016-12-10 DIAGNOSIS — M25561 Pain in right knee: Secondary | ICD-10-CM

## 2016-12-10 DIAGNOSIS — M797 Fibromyalgia: Secondary | ICD-10-CM | POA: Insufficient documentation

## 2016-12-10 DIAGNOSIS — E559 Vitamin D deficiency, unspecified: Secondary | ICD-10-CM | POA: Insufficient documentation

## 2016-12-10 DIAGNOSIS — I341 Nonrheumatic mitral (valve) prolapse: Secondary | ICD-10-CM | POA: Insufficient documentation

## 2016-12-10 HISTORY — DX: Endocarditis, valve unspecified: I38

## 2016-12-10 HISTORY — DX: Gastro-esophageal reflux disease without esophagitis: K21.9

## 2016-12-10 HISTORY — DX: Unspecified osteoarthritis, unspecified site: M19.90

## 2016-12-10 HISTORY — PX: REPLACEMENT TOTAL KNEE: SUR1224

## 2016-12-10 HISTORY — DX: Disorder of thyroid, unspecified: E07.9

## 2016-12-10 LAB — URINALYSIS, MACRO/MICRO
BILIRUBIN: NEGATIVE mg/dL
BLOOD: NEGATIVE mg/dL
GLUCOSE: NEGATIVE mg/dL
KETONES: NEGATIVE mg/dL
LEUKOCYTES: NEGATIVE WBCs/uL
NITRITE: NEGATIVE
PH: 7.5 — ABNORMAL HIGH (ref 5.0–7.0)
SPECIFIC GRAVITY: 1.023 (ref 1.010–1.025)
UROBILINOGEN: 0.2 mg/dL

## 2016-12-10 LAB — TYPE AND SCREEN
ABO/RH(D): O NEG
ANTIBODY SCREEN: NEGATIVE

## 2016-12-10 SURGERY — ARTHROPLASTY KNEE TOTAL
Anesthesia: General | Laterality: Right | Wound class: Clean Wound: Uninfected operative wounds in which no inflammation occurred

## 2016-12-10 MED ORDER — MIDAZOLAM 1 MG/ML INJECTION SOLUTION
INTRAMUSCULAR | Status: AC
Start: 2016-12-10 — End: 2016-12-10
  Administered 2016-12-10: 2 mg via INTRAVENOUS
  Filled 2016-12-10: qty 2

## 2016-12-10 MED ORDER — ROCURONIUM 10 MG/ML INTRAVENOUS SOLUTION
Freq: Once | INTRAVENOUS | Status: DC | PRN
Start: 2016-12-10 — End: 2016-12-10
  Administered 2016-12-10: 50 mg via INTRAVENOUS

## 2016-12-10 MED ORDER — BUPIVACAINE-EPINEPHRINE 0.25 %-1:200,000 INJECTION SOLUTION
Freq: Once | INTRAMUSCULAR | Status: DC | PRN
Start: 2016-12-10 — End: 2016-12-10
  Administered 2016-12-10: 15:00:00 10 mL via INTRAMUSCULAR

## 2016-12-10 MED ORDER — LIDOCAINE (PF) 100 MG/5 ML (2 %) INTRAVENOUS SYRINGE
INJECTION | Freq: Once | INTRAVENOUS | Status: DC | PRN
Start: 2016-12-10 — End: 2016-12-10
  Administered 2016-12-10: 50 mg via INTRAVENOUS

## 2016-12-10 MED ORDER — SODIUM CHLORIDE 0.9 % IRRIGATION SOLUTION
Freq: Once | Status: DC | PRN
Start: 2016-12-10 — End: 2016-12-10
  Administered 2016-12-10: 520 mL

## 2016-12-10 MED ORDER — HYDROCHLOROTHIAZIDE 25 MG TABLET
12.50 mg | ORAL_TABLET | Freq: Every evening | ORAL | Status: DC
Start: 2016-12-11 — End: 2016-12-11
  Administered 2016-12-11: 12.5 mg via ORAL
  Filled 2016-12-10 (×3): qty 0.5

## 2016-12-10 MED ORDER — MIDAZOLAM 1 MG/ML INJECTION SOLUTION
2.0000 mg | Freq: Once | INTRAMUSCULAR | Status: DC | PRN
Start: 2016-12-10 — End: 2016-12-10

## 2016-12-10 MED ORDER — SODIUM CHLORIDE 0.9 % (FLUSH) INJECTION SYRINGE
3.00 mL | INJECTION | INTRAMUSCULAR | Status: DC | PRN
Start: 2016-12-10 — End: 2016-12-11
  Administered 2016-12-10: 3 mL

## 2016-12-10 MED ORDER — OXYCODONE 5 MG TABLET
5.0000 mg | ORAL_TABLET | ORAL | Status: DC | PRN
Start: 2016-12-10 — End: 2016-12-11
  Administered 2016-12-10: 5 mg via ORAL

## 2016-12-10 MED ORDER — OXYCODONE 5 MG TABLET
10.0000 mg | ORAL_TABLET | ORAL | Status: DC | PRN
Start: 2016-12-10 — End: 2016-12-11
  Administered 2016-12-11 (×3): 10 mg via ORAL
  Filled 2016-12-10 (×4): qty 2

## 2016-12-10 MED ORDER — LIOTHYRONINE 25 MCG TABLET
25.00 ug | ORAL_TABLET | Freq: Every evening | ORAL | Status: DC
Start: 2016-12-11 — End: 2016-12-11
  Filled 2016-12-10 (×4): qty 1

## 2016-12-10 MED ORDER — SODIUM CHLORIDE 0.9 % INTRAVENOUS SOLUTION
Freq: Once | Status: DC | PRN
Start: 2016-12-10 — End: 2016-12-10
  Administered 2016-12-10: 10 mL

## 2016-12-10 MED ORDER — PROPOFOL 10 MG/ML IV BOLUS
INJECTION | Freq: Once | INTRAVENOUS | Status: DC | PRN
Start: 2016-12-10 — End: 2016-12-10
  Administered 2016-12-10: 150 mg via INTRAVENOUS

## 2016-12-10 MED ORDER — VANCOMYCIN 1,000 MG IV POWDER - FOR OR
Freq: Once | TOPICAL | Status: DC | PRN
Start: 2016-12-10 — End: 2016-12-10
  Administered 2016-12-10: 1 g via TOPICAL

## 2016-12-10 MED ORDER — FENTANYL (PF) 50 MCG/ML INJECTION SOLUTION
100.0000 ug | Freq: Once | INTRAMUSCULAR | Status: DC | PRN
Start: 2016-12-10 — End: 2016-12-10

## 2016-12-10 MED ORDER — GABAPENTIN 300 MG CAPSULE
600.0000 mg | ORAL_CAPSULE | Freq: Once | ORAL | Status: DC
Start: 2016-12-10 — End: 2016-12-10

## 2016-12-10 MED ORDER — CELECOXIB 200 MG CAPSULE
400.0000 mg | ORAL_CAPSULE | Freq: Once | ORAL | Status: AC
Start: 2016-12-10 — End: 2016-12-10

## 2016-12-10 MED ORDER — MIDAZOLAM 1 MG/ML INJECTION SOLUTION
Freq: Once | INTRAMUSCULAR | Status: DC | PRN
Start: 2016-12-10 — End: 2016-12-10
  Administered 2016-12-10: 2 mg via INTRAVENOUS

## 2016-12-10 MED ORDER — RIVAROXABAN 10 MG TABLET
10.00 mg | ORAL_TABLET | Freq: Every evening | ORAL | Status: DC
Start: 2016-12-10 — End: 2016-12-11
  Administered 2016-12-10 – 2016-12-11 (×2): 10 mg via ORAL
  Filled 2016-12-10 (×3): qty 1

## 2016-12-10 MED ORDER — POTASSIUM CHLORIDE 20 MEQ/L IN DEXTROSE 5 %-0.45 % SODIUM CHLORIDE IV
INTRAVENOUS | Status: DC
Start: 2016-12-11 — End: 2016-12-11

## 2016-12-10 MED ORDER — GLYCOPYRROLATE 0.2 MG/ML INJECTION SOLUTION
Freq: Once | INTRAMUSCULAR | Status: DC | PRN
Start: 2016-12-10 — End: 2016-12-10
  Administered 2016-12-10: 0.6 mg via INTRAVENOUS

## 2016-12-10 MED ORDER — GABAPENTIN 300 MG CAPSULE
ORAL_CAPSULE | ORAL | Status: AC
Start: 2016-12-10 — End: 2016-12-10
  Administered 2016-12-10: 600 mg via ORAL
  Filled 2016-12-10: qty 2

## 2016-12-10 MED ORDER — CEFAZOLIN 2GM IN D5W 50ML MINIBAG IVPB
Freq: Once | INTRAVENOUS | Status: DC | PRN
Start: 2016-12-10 — End: 2016-12-10
  Administered 2016-12-10: 2000 mg via INTRAVENOUS

## 2016-12-10 MED ORDER — SCOPOLAMINE 1 MG OVER 3 DAYS TRANSDERMAL PATCH
1.0000 | MEDICATED_PATCH | Freq: Once | TRANSDERMAL | Status: DC
Start: 2016-12-10 — End: 2016-12-11

## 2016-12-10 MED ORDER — ACETAMINOPHEN 500 MG TABLET
1000.0000 mg | ORAL_TABLET | Freq: Once | ORAL | Status: AC
Start: 2016-12-10 — End: 2016-12-10

## 2016-12-10 MED ORDER — FENTANYL (PF) 100 MCG/2 ML (50 MCG/ML) INTRAVENOUS SYRINGE
25.0000 ug | INJECTION | INTRAVENOUS | Status: DC | PRN
Start: 2016-12-10 — End: 2016-12-10

## 2016-12-10 MED ORDER — PROMETHAZINE 25 MG/ML INJECTION SOLUTION - IV
6.2500 mg | Freq: Once | INTRAMUSCULAR | Status: DC | PRN
Start: 2016-12-10 — End: 2016-12-10

## 2016-12-10 MED ORDER — MORPHINE 2 MG/ML INTRAVENOUS CARTRIDGE
2.0000 mg | CARTRIDGE | INTRAVENOUS | Status: DC | PRN
Start: 2016-12-10 — End: 2016-12-11

## 2016-12-10 MED ORDER — ONDANSETRON HCL (PF) 4 MG/2 ML INJECTION SOLUTION
Freq: Once | INTRAMUSCULAR | Status: DC | PRN
Start: 2016-12-10 — End: 2016-12-10
  Administered 2016-12-10: 4 mg via INTRAVENOUS

## 2016-12-10 MED ORDER — CELECOXIB 200 MG CAPSULE
400.0000 mg | ORAL_CAPSULE | Freq: Once | ORAL | Status: DC
Start: 2016-12-10 — End: 2016-12-10

## 2016-12-10 MED ORDER — NEOSTIGMINE METHYLSULFATE 1 MG/ML INTRAVENOUS SOLUTION
Freq: Once | INTRAVENOUS | Status: DC | PRN
Start: 2016-12-10 — End: 2016-12-10
  Administered 2016-12-10: 3 mg via INTRAVENOUS

## 2016-12-10 MED ORDER — LACTATED RINGERS INTRAVENOUS SOLUTION
INTRAVENOUS | Status: DC
Start: 2016-12-10 — End: 2016-12-11

## 2016-12-10 MED ORDER — WATER FOR INJECTION, STERILE INTRAVENOUS SOLUTION
INTRAVENOUS | Status: DC
Start: 2016-12-10 — End: 2016-12-11
  Filled 2016-12-10: qty 137.5

## 2016-12-10 MED ORDER — LACTATED RINGERS INTRAVENOUS SOLUTION
INTRAVENOUS | Status: DC
Start: 2016-12-10 — End: 2016-12-10

## 2016-12-10 MED ORDER — ACETAMINOPHEN 500 MG TABLET
1000.0000 mg | ORAL_TABLET | Freq: Once | ORAL | Status: DC
Start: 2016-12-10 — End: 2016-12-10

## 2016-12-10 MED ORDER — GABAPENTIN 300 MG CAPSULE
300.0000 mg | ORAL_CAPSULE | Freq: Every evening | ORAL | Status: DC
Start: 2016-12-10 — End: 2016-12-11
  Administered 2016-12-10 (×2): 300 mg via ORAL
  Filled 2016-12-10 (×3): qty 1

## 2016-12-10 MED ORDER — CELECOXIB 200 MG CAPSULE
ORAL_CAPSULE | ORAL | Status: AC
Start: 2016-12-10 — End: 2016-12-10
  Administered 2016-12-10: 400 mg via ORAL
  Filled 2016-12-10: qty 2

## 2016-12-10 MED ORDER — BUPIVACAINE-EPINEPHRINE (PF) 0.5 %-1:200,000 INJECTION SOLUTION
Freq: Once | INTRAMUSCULAR | Status: DC | PRN
Start: 2016-12-10 — End: 2016-12-10
  Administered 2016-12-10: 20 mL

## 2016-12-10 MED ORDER — HYDROCHLOROTHIAZIDE 25 MG TABLET
12.50 mg | ORAL_TABLET | Freq: Every evening | ORAL | Status: DC
Start: 2016-12-11 — End: 2016-12-10
  Filled 2016-12-10: qty 0.5

## 2016-12-10 MED ORDER — DEXAMETHASONE SODIUM PHOSPHATE 4 MG/ML INJECTION SOLUTION
10.0000 mg | Freq: Once | INTRAMUSCULAR | Status: DC
Start: 2016-12-10 — End: 2016-12-11

## 2016-12-10 MED ORDER — FENTANYL (PF) 50 MCG/ML INJECTION SOLUTION
Freq: Once | INTRAMUSCULAR | Status: DC | PRN
Start: 2016-12-10 — End: 2016-12-10
  Administered 2016-12-10 (×4): 50 ug via INTRAVENOUS
  Administered 2016-12-10: 100 ug via INTRAVENOUS

## 2016-12-10 MED ORDER — CELECOXIB 200 MG CAPSULE
200.0000 mg | ORAL_CAPSULE | Freq: Two times a day (BID) | ORAL | Status: DC
Start: 2016-12-10 — End: 2016-12-11
  Administered 2016-12-10 – 2016-12-11 (×2): 200 mg via ORAL
  Filled 2016-12-10 (×5): qty 1

## 2016-12-10 MED ORDER — SODIUM CHLORIDE 0.9 % IRRIGATION SOLUTION
Freq: Once | Status: DC | PRN
Start: 2016-12-10 — End: 2016-12-10
  Administered 2016-12-10: 1000 mL
  Administered 2016-12-10: 3000 mL

## 2016-12-10 MED ORDER — ACETAMINOPHEN 500 MG TABLET
ORAL_TABLET | ORAL | Status: AC
Start: 2016-12-10 — End: 2016-12-10
  Administered 2016-12-10: 11:00:00 1000 mg via ORAL
  Filled 2016-12-10: qty 2

## 2016-12-10 MED ORDER — MEPERIDINE (PF) 50 MG/ML INJECTION SYRINGE
12.5000 mg | INJECTION | INTRAMUSCULAR | Status: DC | PRN
Start: 2016-12-10 — End: 2016-12-10
  Administered 2016-12-10: 12.5 mg via INTRAVENOUS
  Filled 2016-12-10: qty 1

## 2016-12-10 MED ORDER — ONDANSETRON HCL (PF) 4 MG/2 ML INJECTION SOLUTION
4.00 mg | Freq: Three times a day (TID) | INTRAMUSCULAR | Status: AC
Start: 2016-12-10 — End: 2016-12-11
  Administered 2016-12-10 – 2016-12-11 (×2): 0 mg via INTRAVENOUS
  Filled 2016-12-10 (×3): qty 2

## 2016-12-10 MED ORDER — FENTANYL (PF) 50 MCG/ML INJECTION SOLUTION
INTRAMUSCULAR | Status: AC
Start: 2016-12-10 — End: 2016-12-10
  Filled 2016-12-10: qty 6

## 2016-12-10 MED ORDER — DEXTROSE 5 % AND 0.45 % SODIUM CHLORIDE INTRAVENOUS SOLUTION
60.00 mL/h | INTRAVENOUS | Status: DC
Start: 2016-12-10 — End: 2016-12-10

## 2016-12-10 MED ORDER — DEXTROSE 5 % IN WATER (D5W) INTRAVENOUS SOLUTION
2.0000 g | Freq: Once | INTRAVENOUS | Status: DC
Start: 2016-12-10 — End: 2016-12-10
  Filled 2016-12-10: qty 20

## 2016-12-10 MED ORDER — SODIUM CHLORIDE 0.9 % (FLUSH) INJECTION SYRINGE
3.00 mL | INJECTION | Freq: Three times a day (TID) | INTRAMUSCULAR | Status: DC
Start: 2016-12-10 — End: 2016-12-11
  Administered 2016-12-10 – 2016-12-11 (×3): 0 mL

## 2016-12-10 MED ORDER — FENTANYL (PF) 50 MCG/ML INJECTION SOLUTION
INTRAMUSCULAR | Status: AC
Start: 2016-12-10 — End: 2016-12-10
  Administered 2016-12-10: 100 ug via INTRAVENOUS
  Filled 2016-12-10: qty 2

## 2016-12-10 MED ORDER — GABAPENTIN 300 MG CAPSULE
600.0000 mg | ORAL_CAPSULE | Freq: Once | ORAL | Status: AC
Start: 2016-12-10 — End: 2016-12-10

## 2016-12-10 MED ORDER — DEXAMETHASONE SODIUM PHOSPHATE 4 MG/ML INJECTION SOLUTION
Freq: Once | INTRAMUSCULAR | Status: DC | PRN
Start: 2016-12-10 — End: 2016-12-10
  Administered 2016-12-10: 8 mg via INTRAVENOUS

## 2016-12-10 MED ORDER — ACETAMINOPHEN 1,000 MG/100 ML (10 MG/ML) INTRAVENOUS SOLUTION
1000.0000 mg | Freq: Once | INTRAVENOUS | Status: DC
Start: 2016-12-10 — End: 2016-12-10
  Filled 2016-12-10: qty 100

## 2016-12-10 MED ORDER — DULOXETINE 60 MG CAPSULE,DELAYED RELEASE
60.00 mg | DELAYED_RELEASE_CAPSULE | Freq: Every evening | ORAL | Status: DC
Start: 2016-12-11 — End: 2016-12-11
  Administered 2016-12-11: 60 mg via ORAL
  Filled 2016-12-10 (×4): qty 1

## 2016-12-10 MED ORDER — HYDROMORPHONE 0.5 MG/0.5 ML INJECTION SYRINGE
0.5000 mg | INJECTION | INTRAMUSCULAR | Status: DC | PRN
Start: 2016-12-10 — End: 2016-12-10
  Administered 2016-12-10 (×3): 0.5 mg via INTRAVENOUS
  Filled 2016-12-10: qty 1
  Filled 2016-12-10: qty 0.5

## 2016-12-10 MED ORDER — SCOPOLAMINE 1 MG OVER 3 DAYS TRANSDERMAL PATCH
MEDICATED_PATCH | TRANSDERMAL | Status: DC
Start: 2016-12-10 — End: 2016-12-11
  Administered 2016-12-10: 1 via TRANSDERMAL
  Filled 2016-12-10: qty 1

## 2016-12-10 MED ORDER — ACETAMINOPHEN 500 MG TABLET
1000.0000 mg | ORAL_TABLET | Freq: Four times a day (QID) | ORAL | Status: DC
Start: 2016-12-10 — End: 2016-12-11
  Administered 2016-12-10 – 2016-12-11 (×3): 1000 mg via ORAL
  Filled 2016-12-10 (×10): qty 2

## 2016-12-10 MED ADMIN — rocuronium 10 mg/mL intravenous solution: INTRAVENOUS | @ 15:00:00

## 2016-12-10 MED ADMIN — vancomycin 1,000 mg intravenous injection: TOPICAL | @ 15:00:00

## 2016-12-10 MED ADMIN — lactated Ringers intravenous solution: INTRAVENOUS | @ 15:00:00 | NDC 00264775000

## 2016-12-10 MED ADMIN — dexAMETHasone sodium phosphate 4 mg/mL injection solution: @ 11:00:00

## 2016-12-10 MED ADMIN — rivaroxaban 10 mg tablet: ORAL | @ 22:00:00

## 2016-12-10 MED ADMIN — sodium chloride 0.9 % (flush) injection syringe: INTRAVENOUS | @ 12:00:00

## 2016-12-10 MED ADMIN — HEPARIN 5000 UNITS IN NS 500 ML TOPICAL SOLUTION (USING 5,000 UNITS/ML HEPARIN PRODUCT): INTRAMUSCULAR | @ 15:00:00 | NDC 63323054001

## 2016-12-10 MED ADMIN — EPINEPHRINE 1 MG/ML 1 ML IN BSS 500 ML: INTRAVENOUS | @ 16:00:00 | NDC 00409724101

## 2016-12-10 MED ADMIN — albumin, human 5 % intravenous solution: @ 14:00:00

## 2016-12-10 SURGICAL SUPPLY — 91 items
ADHESIVE TISSUE EXOFIN 1.0ML_PREMIERPRO EXOFIN (SEALANTS) ×1
APPL 70% ISPRP 2% CHG 26ML 13._2X13.2IN CHLRPRP PREP DEHP-FR (WOUND CARE/ENTEROSTOMAL SUPPLY) ×1
APPL 70% ISPRP 2% CHG 26ML CHLRPRP HI-LT ORNG PREP STRL LF  DISP CLR (WOUND CARE SUPPLY) ×1 IMPLANT
BANDAGE COFLX NL 5YDX6IN_EASYTEAR ADH CHSV PAT TCH STRN (WOUND CARE/ENTEROSTOMAL SUPPLY) ×1
BANDAGE ESMARK 12FTX6IN ELAS P_LSTR RYN COMP BLU STRL LF (WOUND CARE SUPPLY) ×1 IMPLANT
BANDAGE ESMARK 12FTX6IN ELAS P_LSTR RYN COMP BLU STRL LF (WOUND CARE/ENTEROSTOMAL SUPPLY) ×1
BANDAGE TETRA-FLX 11YDX6IN CLI_P FR WVN ELAS HVDTY COMP STRL (WOUND CARE/ENTEROSTOMAL SUPPLY) ×1
BANDAGE TETRA-FLX 11YDX6IN STRL HVDTY CLIP FREE ELAS COMPRESS LF (WOUND CARE SUPPLY) ×1 IMPLANT
BASEPLATE TIB TRIATH 3 UNIV KNEE CEMENT TL STAB COCR STRL LF ×1 IMPLANT
BLADE SAW 90X18MM SGTL THK1.27MM STRL (SURGICAL CUTTING SUPPLIES) IMPLANT
BLADE SAW 90X18X1.27MM SGTL SY_S 6 STRL (CUTTING ELEMENTS)
BLADE SAW 90X25X1.27MM SGTL SY_S 6 (CUTTING ELEMENTS) ×1
BLADE SAW 90X25X1.27MM SGTL SY_S 6 (SURGICAL CUTTING SUPPLIES) ×1 IMPLANT
BLADE SURG CLPR W 37.2MM GP EXIST HNDL GTT IN CHRG .23MM NONST LF  DISP (SURGICAL CUTTING SUPPLIES) ×1 IMPLANT
BLADE SURG CLPR W 37.2MM GP EX_IST HNDL GTT IN CHRG .23MM (CUTTING ELEMENTS) ×2
BLADE SW 105X19.5MM PREC THK1.33MM FLCN CART LF (CUTTING ELEMENTS) ×1
BLADE SW 105X19.5MM PREC THK1.33MM FLCN CART LF (SURGICAL CUTTING SUPPLIES) ×1 IMPLANT
BONE CEM MXVC3 HI VACU SPAT TU_BE UNIV SYSTEM STRL LF GRY (ORTHOPEDICS (NOT IMPLANTS)) ×1
BONE CEM POWER-FLO CMPCT VCMX W MOUTH FNL PUMP ADPR TUBE SYSTEM STRL DISP CLR (CEM) IMPLANT
BONE CEM POWER-FLO CMPCT VCMX_W MOUTH FNL PUMP ADPR TUBE (CEM)
CEMENT BONE SIMPLEX RADOPQ FD STRL (CEMENT) ×3 IMPLANT
CLEANSER SKIN HIBICLENS 4% CHG 15ML ANTIMIC ANSEP (SSOP) ×1 IMPLANT
CLEANSER SKN HIBICLENS 4% CHG_15ML ANTIMIC ANSEP (SSOP) ×1
CLOSURE SKIN STRIPS 1/2X4IN_R1547 6/PK 50PK/BX (WOUND CARE/ENTEROSTOMAL SUPPLY) ×1
CONV USE 405187 - CUFF TOURNIQUET BLU 30X4IN ATS 3K CYL 2 PORT BLADDER POS LOCK CONN SLEEVE STRL LF  DISP (ORTHOPEDICS (NOT IMPLANTS)) ×1 IMPLANT
CONV USE ITEM 156524 - ADHESIVE TISSUE EXOFIN 1.0ML_PREMIERPRO EXOFIN (SEALANTS) ×1 IMPLANT
CONV USE ITEM 308816 - GLOVE SURG 9 LF PF BEAD CUF S_MTH STRL BRN TINT 12IN (GLOVES AND ACCESSORIES) ×1
CONV USE ITEM 308912 - GLOVE SURG 8 LF PF BEAD CUF S_MTH STRL BRN TINT 12IN (GLOVES AND ACCESSORIES) ×1
CONV USE ITEM 313924 - BANDAGE COFLX NL 5YDX6IN_EASYTEAR ADH CHSV PAT TCH STRN (WOUND CARE SUPPLY) ×1 IMPLANT
CONV USE ITEM 321825 - GLOVE SURG 8 LF PF BEAD CUF S_MTH STRL BRN TINT 12IN (GLOVES AND ACCESSORIES) ×1 IMPLANT
CONV USE ITEM 321831 - GLOVE SURG 9 LF PF BEAD CUF S_MTH STRL BRN TINT 12IN (GLOVES AND ACCESSORIES) ×1 IMPLANT
CONV USE ITEM 338563 - PACK SURG TL JNT PRX STRL DISP LF (CUSTOM TRAYS & PACK) ×1 IMPLANT
CONV USE ITEM 345921 - BANDAGE TETRA-FLX 11YDX6IN STRL HVDTY CLIP FREE ELAS COMPRESS LF (WOUND CARE SUPPLY) ×1 IMPLANT
COVER 60IN 2 TIER PAD HVDTY BCK STRL BLU EQP (DRAPE/PACKS/SHEETS/OR TOWEL) ×1 IMPLANT
COVER 60IN 2 TIER PAD HVDTY BC_K STRL BLU EQP (DRAPE/PACKS/SHEETS/OR TOWEL) ×1
COVER REINF BCK LF  DISP 90X44IN TBL CNVRT STRL POLY (EQUIPMENT MINOR) ×1 IMPLANT
COVER REINF BCK LF DISP 90X44_IN TBL CNVRT STRL POLY (EQUIPMENT MINOR) ×1
CUFF TOURNIQUET 30IN X 4IN (ORTHOPEDICS (NOT IMPLANTS)) ×1
CUFF TOURNIQUET BLU 30X4IN ATS 3K CYL 2 PORT BLADDER POS LOCK CONN SLEEVE STRL LF  DISP (ORTHOPEDICS (NOT IMPLANTS)) ×1
DEVICE CLSR VLOC 180 18IN PREM_RVRS CUT ABS 2-0 P-12 3/8 CRC (SUTURE/WOUND CLOSURE) ×1
DEVICE CLSR VLOC 180 24IN ABS 0 GS-21 TAPER PNT GRN (SUTURE/WOUND CLOSURE) ×4 IMPLANT
DRAPE 2 INCS FILM ANTIMIC 33X23IN IOBN STRL SURG (PROTECTIVE PRODUCTS/GARMENTS) ×1 IMPLANT
DRAPE 2 INCS FILM ANTIMIC 33X2_3IN IOBN STRL SURG (PROTECTIVE PRODUCTS/GARMENTS) ×1
DRAPE ADH 51X47IN STRDRP LF  STRL DISP SURG CLR (PROTECTIVE PRODUCTS/GARMENTS) ×1 IMPLANT
DRAPE ADH 51X47IN U STRDRP LF_STRL DISP SURG PLASTIC CLR (PROTECTIVE PRODUCTS/GARMENTS) ×1
DRAPE IMPRV ADH STRP 84X60IN U CNVRT LF STRL DISP SURG TBRN (PROTECTIVE PRODUCTS/GARMENTS) ×2 IMPLANT
DRAPE LWR EXTREMITY ARMBRD CVR CIRC FENESTRATE TUBE HLDR 126 (PROTECTIVE PRODUCTS/GARMENTS) ×2 IMPLANT
DRESS GRMCDL 14X3.5IN AQCL AG POLYUR IONIC SILVER CVR LYR WTPRF BCTR BARRIER ANTIMIC (WOUND CARE SUPPLY) IMPLANT
DRESS GRMCDL 14X3.5IN AQCL AG_POLYUR IONIC SILVER CVR LYR (WOUND CARE/ENTEROSTOMAL SUPPLY)
DRESS HYDROCOLLOID 10X3.5IN AQCL AG CVR LYR WTPRF BCTR BARRIER ANTIMIC SILVER POLYUR (WOUND CARE SUPPLY) ×1 IMPLANT
DRESS HYDROCOLLOID 10X3.5IN AQ_CL AG CVR LYR WTPRF BCTR (WOUND CARE/ENTEROSTOMAL SUPPLY) ×2
DURAPREP 6ML 8635 CS/50 (SSOP) ×1
ELECTRODE PATIENT RTN 9FT VLAB C30- LB RM PHSV ACRL FOAM CORD NONIRRITATE NONSENSITIZE ADH STRP (CAUTERY SUPPLIES) ×1 IMPLANT
ELECTRODE PATIENT RTN 9FT VLAB_REM C30- LB PLHSV ACRL FOAM (CAUTERY SUPPLIES) ×1
FEM 3 CRCTE RTN CEMENT TRIATH KNEE RGT COM STRL LF ×1 IMPLANT
GARMENT COMPRESS MED CALF CENTAURA NYL VASOGRAD LTWT BRTHBL SEQ FIL BLU 18- IN (ORTHOPEDICS (NOT IMPLANTS)) ×1 IMPLANT
GARMENT COMPRESS MED CALF CENT_AURA NYL VASOGRAD LTWT BRTHBL (ORTHOPEDICS (NOT IMPLANTS)) ×1
GOWN SURG LRG AAMI L4 IMPRV RG_LN SLEEVE BRTHBL STRL LF DISP (DGOW) ×1
GOWN SURG LRG L4 BRTHBL STRL LF  DISP SMARTGOWN (DGOW) ×1 IMPLANT
HANDLE SUCT MEDIVAC FLXCLR REG CPC FLXB CLR STRL LF  DISP (SURGICAL INSTRUMENTS) ×1 IMPLANT
HANDLE SUCT MEDIVAC FLXCLR REG_CPC FLXB CLR STRL LF DISP (INSTRUMENTS) ×1
INSERT TIB BRNG CONDYLAR STAB_3 11MM TRIATH KNEE STRL ×1 IMPLANT
KIT PLS LAV PLSVC + FAN SPRAY (IRR) ×2 IMPLANT
LABEL STRL GENERAL_C3333UHCG 100SH/BX (LABELS/CHART SUPPLIES) ×2 IMPLANT
MIXER BONE CEM MXVC3 STRL LF DISP (ORTHOPEDICS (NOT IMPLANTS)) ×1 IMPLANT
NEEDLE HYPO  18GA 1.5IN REG WL SFGLD POLYPROP REG BVL LL SHIELD MECH DEHP-FR IM PNK STRL LF  DISP (NEEDLES & SYRINGE SUPPLIES) ×2 IMPLANT
NEEDLE HYPO 18GA 1.5IN REG WL_SFGLD POLYPROP LL HUB SHIELD (NEEDLES & SYRINGE SUPPLIES) ×2
PACK SURG TL JNT PRX STRL DISP LF (CUSTOM TRAYS & PACK) ×1
PACK TOTAL JOINT 4/CS_DYNJ64215 (CUSTOM TRAYS & PACK) ×1
PADDING CAST 4YDX6IN WEBRIL CO T UNDCST REG FNSH CRMP STRL LF (CAST) ×6 IMPLANT
PATEL 10MM 32MM X3 ASYM TRIATH KNEE COM STRL ×1 IMPLANT
PIN FIX 3.5IN 1/8IN FLUTE SQ K_NEE SS HDLS STRL (ORTHOPEDICS (NOT IMPLANTS)) ×2 IMPLANT
SOL SURG PREP 6ML DRPRP 74% ISPRP 0.7% IOD POVACRYLEX SLF CNTN APPL SKIN STRL PREOP (SSOP) ×1 IMPLANT
SOLUTION PREP PVP IODINE 4OZ 872 36EA/CS (DIS) ×2 IMPLANT
SOLUTION SCRUB PVP IODINE 4OZ 875 36EA/CS (DIS) ×2 IMPLANT
SPONGE GAUZE ST 4X4IN 7605 128TY/CS (WOUND CARE SUPPLY) IMPLANT
SPONGE GAUZE ST 4X4IN 7605 128TY/CS (WOUND CARE/ENTEROSTOMAL SUPPLY)
SPONGE LAP 18X18 RFD STRL_L181804P01C1 40PK/CS (WOUND CARE/ENTEROSTOMAL SUPPLY) ×2
SPONGE LAP 18X18IN STD 4 PLY XRY RF DTBL ABS RFDETECT COTTON STRL LF  DISP (WOUND CARE SUPPLY) ×2 IMPLANT
STKNT ORTHO 48X12IN PLSTR CNVRT IMPRV DRP LF  XL STRL DISP (ORTHOPEDICS (NOT IMPLANTS)) ×1 IMPLANT
STOCKINETTE IMPERVIOUS STRL_12IN X 48IN CS/40 1587 (ORTHOPEDICS (NOT IMPLANTS)) ×1
STRIP 4X.5IN STRSTRP PLSTR REINF SKNCLS WHT STRL LF (WOUND CARE SUPPLY) ×1 IMPLANT
SUTURE 1 CTX ETHIBOND 30IN GRN BRD NONAB (SUTURE/WOUND CLOSURE) ×1 IMPLANT
SUTURE 1 CTX ETHIBOND 30IN GRN_BRD NONAB (SUTURE/WOUND CLOSURE) ×1
SUTURE 2-0 P-12 3/8 CRC VLOC 180 18IN CLR PREM RVRS CUT ABS ABS 19MM (SUTURE/WOUND CLOSURE) ×1 IMPLANT
SYRINGE LL 30ML LF  STRL CONCEN TIP GRAD N-PYRG DEHP-FR MED DISP (NEEDLES & SYRINGE SUPPLIES) ×1 IMPLANT
SYRINGE LL 30ML LF STRL CONCE_N TIP GRAD N-PYRG DEHP-FR MED (NEEDLES & SYRINGE SUPPLIES) ×1
TOGA SURG FLYTE STRSHLD 2XL ZP_PLWY PRSNL PROTECT SYS (DGOW) ×4 IMPLANT
TOGA SURG FLYTE SURGICOOL XL ZP (DGOW) ×2 IMPLANT
TOGA SURG FLYTE SURGICOOL XL Z_P (DGOW) ×4
TRAY CATH 16FR SURESTEP LUBRIC ATH STATLK DRAIN BAG STAB DEV (UROLOGICAL SUPPLIES) ×2 IMPLANT

## 2016-12-10 NOTE — Nurses Notes (Signed)
Dr. Jules SchickAngotti paged for post op medical management.

## 2016-12-10 NOTE — Nurses Notes (Signed)
Dr. Jules SchickAngotti paged that pt takes Cytomel 25mcg every evening and pt is requesting it.

## 2016-12-10 NOTE — Anesthesia Postprocedure Evaluation (Signed)
Anesthesia Post Op Evaluation    Patient: Tracie [name]  Procedure(s) Performed:Procedure(s):  ARTHROPLASTY KNEE TOTAL  Last Vitals:Temperature: 37.3 C (99.1 F) (12/10/16 1622)  Heart Rate: 91 (12/10/16 1715)  BP (Non-Invasive): 133/73 (12/10/16 1715)  Respiratory Rate: (!) 11 (12/10/16 1715)  SpO2-1: 100 % (12/10/16 1715)  Pain Score (Numeric, Faces): 7 (12/10/16 1709)  Patient is sufficiently recovered from the effects of anesthesia to participate in the evaluation and has returned to their pre-procedure level.  Patient location during evaluation: PACU   Post-procedure handoff checklist completed    Patient participation: complete - patient participated  Level of consciousness: awake and alert and responsive to verbal stimuli  Pain management: adequate  Airway patency: patent  Anesthetic complications: no  Cardiovascular status: acceptable  Respiratory status: acceptable  Hydration status: acceptable  Patient post-procedure temperature: Pt Normothermic   PONV Status: Absent

## 2016-12-10 NOTE — OR Surgeon (Signed)
Preop diagnosis:  Right knee osteoarthritis  Postop diagnosis:  Right knee osteoarthritis: grade 4 patella/ femur; proximal lateral tibia  Procedure:  Right total knee replacement  Surgeon:  Lovena Lehristopher Kenasia Scheller DO  Assistant: none  Anesthesia:  General with adductor block  Complications:  None  Condition:  Stable  Estimated blood loss:  25 cc  Tourniquet time:  44 minutes  Specimens:  None  Components:  [employer] triathlon total knee system:      Femoral component: 3     Tibial component: 3     Patella: 32mm     Polyethylene liner:  11  Operative indications:  Tracie AndersonDianne M [name] is a 51 y.o.-year-old  who has had ongoing right-sided knee pain.  Multiple conservative treatment approaches have been attempted all of which did not provide adequate relief for the patient.   After review of all treatment option the patient has decided to undergo a right total knee replacement.   I have reviewed the risks, benefits, and rationale of surgery.  I reviewed the consent in its entirety.  I have reviewed the hospital course.  I have reviewed expected intraoperative and postoperative goals.  Tracie [name] has gain preop medical clearance from Royston CowperErin C Hawkins, CFNP.  The patient and family arrived on the date of surgery.  I have given them an opportunity ask questions pertaining to the consent intraoperative and postoperative goals.  A patient time-out was carried out where they verbally identified the right knee.    The right knee with sign and patient was transferred back to the operating room.   Procedure:  Patient was transferred to the surgical table.  Successful anesthesia was obtained.  The right knee was identified and a tourniquet was  placed high on the right thigh.  The right lower extremity then prepped and draped in normal standard fashion.  A midline incision was drawn over the knee.  Collier Flowersoban was used to cover the skin in its entirety.  Surgical time-out was carried out identifying the patient, position of the patient,  confirmed preoperative antibiotics was administered,  Equipment present, x-rays were up and reviewed.  Once a time-out was completed, the leg was exsanguinated and tourniquet let up a 300 mm of mercury.  The skin was incised.  Dissection was carried out thru the superficial tissue down to the knee capsule.  The medial parapatellar approach was utilized.  A partial synovectomy was carried out.  I re-established the medial and lateral gutters.  Bony osteophyte were taken down.  The knee was flexed to 90 degrees with the patella positioned laterally.  Retractors were place to protect  MCL and LCL.   The intramedullary guide was placed.  Distal resection of the femur was carried out per technique. The trans epicondylar axis was identified and marked.  The femur was sized.  The 4-1 cutting jig was placed based off of the transepicondylar axis.  Bony cuts were carried out.  With the knee flexed at 90 degrees the PCL retractor was placed.  The extramedullary cutting jig was position.  The proximal tibia cut was carried out. The tibia was sized and rotation marked.   Attention was then placed on the patella. The  patella was measured, cut and sized.  Patella pegs were then drilled.  With all bony cuts made trials were placed.  The leg was taken through range of motion.  Symmetric balance was noted medial to lateral.  PCL was intact and functioning.  Patella was tracking nice and midline.  Component were called for and brought into the sterile field.  Trial components removed.  Bovie cautery was utilized to the back of the knee.  Local injection was provided.  A 3 minutes Betadine rinse was carried out.  The leg was then irrigated thoroughly.  Bone drying techniques carried out.  Cement mixed and components were inserted.  Any excess cement removed.  Once cement had set up, the knee was taken through final range of motion.  Knee was stable in all planes with a nice midline tracking patella.  Tourniquet was let down.  The  medial parapatellar approach was closed with interrupted Ethibon and over sewn with 0 V-lock suture.  The superficial tissue was then irrigated thoroughly.  Subcutaneous fatty layer was closed.  Subdermal layer closed with 2.0 V-lock suture.  The skin was closed using dermabond and Steri-Strips.  Aquacel dressing applied followed by webril, ace wrap, and then thigh high ted hose.  Patient was transferred to recovery room in stable condition.

## 2016-12-10 NOTE — Nurses Notes (Signed)
Dr. Toni Amendourtney paged to release orders. Orders "pended" but unable to release them.

## 2016-12-10 NOTE — Anesthesia Preprocedure Evaluation (Addendum)
ANESTHESIA PRE-OP EVALUATION  Review of Systems         patient summary reviewed  nursing notes reviewed        Pulmonary     Cardiovascular    Valvular problems/murmurs, MR and dysrhythmias (Hx vent bigem)No peripheral edema       GI/Hepatic/Renal   GERD    Endo/Other    hypothyroidism     Neuro/Psych/MS    Cancer                        Physical Assessment      Patient summary reviewed and Nursing notes reviewed   Airway       Mallampati: I      Mouth Opening: good.            Dental                    Pulmonary    Breath sounds clear to auscultation  (-) no rhonchi, no decreased breath sounds, no wheezes, no rales and no stridor     Cardiovascular    Rhythm: regular  Rate: Normal  (-) no friction rub, carotid bruit is not present, no peripheral edema and no murmur     Other findings            Plan  Planned anesthesia type: regional block    ASA 2

## 2016-12-10 NOTE — Anesthesia Transfer of Care (Signed)
ANESTHESIA TRANSFER OF CARE   Tracie [name] is a 51 y.o. ,female, Weight: 70.6 kg (155 lb 9.6 oz)   had Procedure(s):  ARTHROPLASTY KNEE TOTAL  performed  12/10/16   Primary Service: Alric Ran*    Past Medical History:   Diagnosis Date   . Abnormal Pap smear 2001    LSIL   . Acute neck pain 2014     muscle relaxant med   . Arthritis    . Breast cyst     bilateral, multiple, variable sizes.    . Chest pain 01/28/2008    work-up negative   . Dyspepsia 2014    Zantac med   . Dysplasia of cervix 2001    Colpo CIN 1   . Esophageal reflux    . Hypothyroidism    . MVP (mitral valve prolapse) 12/04/2016   . Other allergic rhinitis 12/04/2016   . Psychosocial stressors 2013 - 2014    . Sjogren's disease    . Thyroid disorder    . Unspecified breast disorder 16109 - present     large breast cysts    . Valvular disease       Allergy History as of 12/10/16     TETRACYCLINE       Noted Status Severity Type Reaction    01/27/08 Janeann Merl, RN 01/27/08 Active             LEVOTHYROXINE       Noted Status Severity Type Reaction    12/04/16 1427 Katina Degree, LPN 60/45/40 Active       Comments:  Leg pain             AMOXICILLIN-POT CLAVULANATE       Noted Status Severity Type Reaction    12/04/16 1427 Katina Degree, LPN 98/11/91 Active       Comments:  Extreme constipation                I completed my transfer of care / handoff to the receiving personnel during which we discussed:  Labs, Access, Airway, All key/critical aspects of case discussed, Analgesia, Antibiotics, Expectation of post procedure, Fluids/Product and Gave opportunity for questions and acknowledgement of understanding                                                                    Last OR Temp: Temperature: 37.3 C (99.1 F)  ABG:  POTASSIUM   Date Value Ref Range Status   11/27/2016 3.4 (L) 3.6 - 5.1 mmol/L Final   02/21/2015 3.3 (L) 3.5 - 5.1 mmol/L Final     KETONES   Date Value Ref Range Status   12/10/2016 Negative Negative mg/dL Final      CALCIUM   Date Value Ref Range Status   11/27/2016 9.4 8.9 - 10.3 mg/dL Final   47/82/9562 9.6 8.5 - 10.4 mg/dL Final     Calculated P Axis   Date Value Ref Range Status   11/27/2016 75 deg Final     Calculated T Axis   Date Value Ref Range Status   11/27/2016 50 deg Final     Airway:   Blood pressure (!) 127/100, pulse 87, temperature 37.3 C (99.1 F), resp. rate 15, height 1.702 m (5\' 7" ),  weight 70.6 kg (155 lb 9.6 oz), SpO2 100 %.

## 2016-12-10 NOTE — Care Plan (Signed)
[facility]  Rehabilitation Services  Physical Therapy Initial Evaluation    Patient Name: MATRICE HERRO  Date of Birth: Apr 13, 1966  Height: Height: 170.2 cm (5\' 7" )  Weight: Weight: 70.6 kg (155 lb 9.6 oz)  Room/Bed: 5321/A  Payor: [employer] / Plan: [employer] NOT MANAGED CARE / Product Type: Non Managed Care /     Assessment:      (P) Pt would continue to benefit from skilled therapy services to improve ROM, strength, balance, bed mobility, transfers, and ambulation to promote a safe discharge home/return to PLOF.      Lower Extremity Assessment:  RLE Assessment  RLE ROM: (P) 0-60  RLE Strength: (P) 4/5 Gross right LE strength   LLE Assessment  LLE Assessment: (P) WFL- Within Functional Limits    Gait Assessment:  Gait Assessment/Treatment  Independence : (P) contact guard assist  Assistive Device : (P) rolling walker  Distance in Feet: (P) 51ft  Deviations : (P) step length decreased, stride length decreased, weight-shifting ability decreased  Maintain Weight Bearing Status: (P) able to maintain  Safety Issues : (P) step length decreased, weight-shifting ability decreased  Impairments : (P) pain, ROM decreased, strength decreased  Comment: (P) Pt ambulated 65ft with CGA using FWW.          Discharge Needs:    Equipment Recommendation: (P) none anticipated     Discharge Disposition: (P) home with home health    JUSTIFICATION OF DISCHARGE RECOMMENDATION   Based on current diagnosis, functional performance prior to admission, and current functional performance, this patient requires continued PT services in (P) home with home health in order to achieve significant functional improvements in these deficit areas: (P) gait, locomotion, and balance.        Plan:   Current Intervention: (P) bed mobility training, gait training, home exercise program, patient/family education, ROM (range of motion), strengthening, transfer training  To provide physical therapy services (P) 2x/day, minimum of 5x/week  for duration of  (P) until discharge.    The risks/benefits of therapy have been discussed with the patient/caregiver and he/she is in agreement with the established plan of care.       Subjective & Objective      12/10/16 2021   Therapist Pager   PT Assigned/ Pager # [phone]    Rehab Session   Document Type evaluation   Total PT Minutes: 25   Patient Effort excellent   Symptoms Noted During/After Treatment increased pain   Symptoms Noted Comment Pt notes slight increase in pain with ambulation   General Information   Patient Profile Reviewed? yes   Onset of Illness/Injury or Date of Surgery 12/10/16   Patient/Family Observations Pt is a 51 yo female admitted to the hospital on 12/10/2016 status post right TKA. PMHx includes Sjogren's disease   Respiratory Status room air   Weight-bearing Status   Right Lower Extremity  weight-bearing as tolerated (WBAT)   Living Environment   Lives With spouse   Living Arrangements apartment   Living Environment Comment Pt lives in 1 level apartment with no steps to enter.    Functional Level Prior   Ambulation 0 - independent   Transferring 0 - independent   Toileting 0 - independent   Bathing 0 - independent   Dressing 0 - independent   Eating 0 - independent   Communication 0 - understands/communicates without difficulty   Swallowing 0-->swallows foods/liquids without difficulty   Prior Functional Level Comment Pt was previously independent in all functional  mobility activities.    Pre Treatment Status   Pre Treatment Patient Status Patient supine in bed   Support Present Pre Treatment  None   Cognitive Assessment/Interventions   Behavior/Mood Observations alert   Orientation Status  oriented x 4   Attention WNL/WFL   Follows Commands  WNL   Vital Signs   Vitals Comment All vital signs WNLs pre and post treatment session.    Pain Assessment   Additional Documentation Numbers Pain Scale, Pre/Post Treatment (Group)   Pain Scale: Numbers, Pretreatment 5/10   Pain Scale: Numbers, Post-Treatment 6/10    Pain Location - Side Right   Pain Location - Orientation medial   Pain Location knee   Pre/Post Treatment Pain Comment Pt notes slight increase in pain post ambulation     RUE Assessment   RUE Assessment WFL- Within Functional Limits   LUE Assessment   LUE Assessment WFL- Within Functional Limits   RLE Assessment   RLE ROM 0-60   RLE Strength 4/5 Gross right LE strength    LLE Assessment   LLE Assessment WFL- Within Functional Limits   Trunk Assessment   Trunk Assessment WFL-Within Functional Limits   Bed Mobility Assessment/Treatment   Bed Mobility, Assistive Device Head of Bed Elevated   Supine-Sit Independence minimum assist (75% patient effort)   Sit to Supine, Independence minimum assist (75% patient effort)   Safety Issues decreased use of legs for bridging/pushing   Impairments pain;ROM decreased;strength decreased   Comment  Pt performed supine to sit transfer with Min A from therapist for management of right LE.    Transfer Assessment/Treatment   Sit-Stand Independence  contact guard assist   Stand-Sit Independence  contact guard assist   Sit-Stand-Sit, Assist Device rolling walker   Transfer Safety Issues weight-shifting ability decreased   Transfer Impairments pain;ROM decreased;strength decreased   Transfer Comment Pt performed sit to stand transfers with CGA using FWW for support.    Gait Assessment/Treatment   Independence  contact guard assist   Assistive Device  rolling walker   Distance in Feet 53ft   Deviations  step length decreased;stride length decreased;weight-shifting ability decreased   Maintain Weight Bearing Status able to maintain   Safety Issues  step length decreased;weight-shifting ability decreased   Impairments  pain;ROM decreased;strength decreased   Comment Pt ambulated 1ft with CGA using FWW.    Balance Skill Training   Sitting Balance: Static normal balance   Sitting, Dynamic (Balance) normal balance   Sit-to-Stand Balance fair + balance   Standing Balance: Static good balance    Standing Balance: Dynamic fair balance   Systems Impairment Contributing to Balance Disturbance musculoskeletal   Identified Impairments Contributing to Balance Disturbance pain;decreased ROM   Therapeutic Exercise/Activity   Comment Pt performed LAQs for 10 reptitions while sitting on EOB.    Post Treatment Status   Post Treatment Patient Status Patient supine in bed   Support Present Post Treatment  None   Plan of Care Review   Plan Of Care Reviewed With patient   Physical Therapy Clinical Impression   Assessment Pt would continue to benefit from skilled therapy services to improve ROM, strength, balance, bed mobility, transfers, and ambulation to promote a safe discharge home/return to PLOF.    Criteria for Skilled Therapeutic yes   Pathology/Pathophysiology Noted musculoskeletal   Impairments Found (describe specific impairments) gait, locomotion, and balance   Functional Limitations in Following  self-care   Rehab Potential good, to achieve stated therapy goals   Therapy  Frequency 2x/day;minimum of 5x/week   Predicted Duration of Therapy Intervention (days/wks) until discharge   Anticipated Equipment Needs at Discharge (PT Clinical Impression) none anticipated   Anticipated Discharge Disposition  home with home health   Evaluation Complexity Justification   Patient History: Co-morbity/factors that Impact Plan of Care 1-2 that impact Plan of Care   Examination Components 1-2 Exam elements addressed   Presentation Stable: Uncomplicated, straight-forward, problem focused   Clinical Decision Making Low complexity   Evaluation Complexity Low complexity   Care Plan Goals   PT Rehab Goals Bed Mobility Goal;Gait Training Goal;Range of Motion Goal;Strength Goal;Transfer Training Goal   Bed Mobility Goal   Bed Mobility Goal, Date Established 12/10/16   Bed Mobility Goal, Time to Achieve by discharge   Bed Mobility Goal, Activity Type all bed mobility activities   Bed Mobility Goal, Independence Level modified  independence   Bed Mobility Goal, Assistive Device least restricted assistive device   Gait Training  Goal, Distance to Achieve   Gait Training  Goal, Date Established 12/10/16   Gait Training  Goal, Time to Achieve by discharge   Gait Training  Goal, Independence Level modified independence   Gait Training  Goal, Assist Device walker, rolling   Gait Training  Goal, Distance to Achieve 38200ft   Range of Motion Goal   Range of Motion Goal, Date Established 12/10/16   Range of Motion Goal, Time to Achieve by discharge   Range of Motion Goal, AROM Measure 0-90   Strength Goal   Strength Goal, Date Established 12/10/16   Strength Goal, Time to Achieve by discharge   Strength Goal, Measure to Achieve 4+/5 Gross right LE strength    Transfer Training Goal   Transfer Training Goal, Date Established 12/10/16   Transfer Training Goal, Time to Achieve by discharge   Transfer Training Goal, Activity Type all transfers   Transfer Training Goal, Independence Level modified independence   Transfer Training Goal, Assist Device walker, rolling   Planned Therapy Interventions, PT Eval   Planned Therapy Interventions (PT Eval) bed mobility training;gait training;home exercise program;patient/family education;ROM (range of motion);strengthening;transfer training       Therapist:   Zadie Rhinelison Dhanya Bogle 12/10/2016 20:33

## 2016-12-10 NOTE — Anesthesia Procedure Notes (Addendum)
Block: Peripheral Block     Sedation  The patient was continuously monitored throughout the procedure and in recovery. I was in attendance and supervised the sedation (during the start and stop times listed below) and remained immediately available until the patient returned to pre-procedure baseline.  Elinor Dodgehomas N Gabe Glace, MD 12/10/2016, 11:53      Blocks  Block Type: adductor canal block   Diagnosis: knee pain   Indication: at surgeon's request  Laterality: Right  Requesting Surgeon: Lovena LeOURTNEY, CHRISTOPHER  Site verified, H&P updated and consent obtained, Patient monitors applied, Timeout performed, Emergency drugs and equipment available, Patient positioned and anesthesia consent given  Technique(See MAR for doses)  Type of Block: catheter        Preprocedure hand washing was performed; sterile field was maintained with sterile gloves, gown, cap, mask, and sterile large drape       Sterile Skin Prep : Chlorhexidine and Sterile drape, cap, Sterile gown, Draped, Sterile technique, Sterile gloves and Sterile field established    chlorhexidine    Skin Local  Local amount 3 mL. lidocaine 1%   Needle  Needle type: ultrasound needle     Needle Gauge: 20G   Needle length: 6 in.  Number of attempts: 1      Catheter          Site    Assessment       Events  inserted easily           Performed By:  Anesthesiologist: Elinor DodgeWALKER, Deontray Hunnicutt N

## 2016-12-10 NOTE — H&P (Signed)
H&P  Encounter Date: 11/27/2016  Lovena Le, DO   ORTHOPEDIC SURGERY      [] Hide copied text  [] Hover for attribution information    Right TKA Pre-Op    Patient: Tracie [name]  Chart #: [id number]  DOB: 1966/03/28  DOS:  11/27/2016     Chief Complaint:        Chief Complaint   Patient presents with    Pre-op     rtka sx 12-10-16       History of Present Illness:  Mrs. Boulay  is a pleasant 51 y.o. year-old female who comes to the office today in preoperative evaluation in regards to her upcoming right total knee arthroplasty which is scheduled for 12/10/2016.  Patient states that she has to continued to have right knee pain despite conservative measures.  She has tried non-steroidal anti-inflammatories, corticosteroid injections and activity modification.     PAST MEDICAL HISTORY:       Past Medical History:   Diagnosis Date    Abnormal Pap smear 2001    LSIL    Acute neck pain 2014     muscle relaxant med    Breast cyst     bilateral, multiple, variable sizes.     Chest pain 01/28/2008    work-up negative    Dyspepsia 2014    Zantac med    Dysplasia of cervix 2001    Colpo CIN 1    Hypothyroidism     Irregular heart beat     Psychosocial stressors 2013 - 2014     Sjogren's disease     Unspecified breast disorder 45409 - present     large breast cysts            PAST SURGICAL HISTORY:        Past Surgical History:   Procedure Laterality Date    BIOPSY BREAST  10/12/03; 11/03/07,  04/28/2012     left breast aspirations x 2 - 2004 - 15 ml turbid and 2009 - 3 ml ; 04/28/2012 Right with rare calcifications.    HX BREAST BIOPSY Right     needle bx.  age 82    HX CYST INCISION AND DRAINAGE Left     aspiration    HX TONSILLECTOMY  1974           MEDICATION ALLERGIES:    Outpatient Medications Prior to Visit:  aMILoride-hydrochlorothiazide (MODURETIC) 5-50 mg Oral Tablet Take 1 Tab by mouth Once a day   Ascorbic Acid (VITAMIN C) 1,000 mg Oral Tablet Take 1,000 mg by mouth  Once a day   cholecalciferol, vitamin D3, 1,000 unit Oral Tablet Take 1,000 Units by mouth Once a day Patient takes 2000 IU nightly   cyanocobalamin (VITAMIN B12) 250 mcg Oral Tablet Take 250 mcg by mouth Four times daily with food   cycloSPORINE (RESTASIS) 0.05 % Ophthalmic Dropperette Instill 1 Drop into both eyes Every 12 hours   DULoxetine (CYMBALTA DR) 30 mg Oral Capsule, Delayed Release(E.C.) Take 60 mg by mouth Once a day   flecainide (TAMBOCOR) 50 mg Oral Tablet Take 50 mg by mouth Twice daily   gabapentin (NEURONTIN) 300 mg Oral Capsule Take 300 mg by mouth Once a day   Glucosamine-Chondroit-Vit C-Mn (GLUCOSAMINE 1500 COMPLEX) 500-400 mg Oral Capsule Take by mouth   Green Tea Leaf Extract (GREEN TEA) Oral Capsule Take by mouth   hydroCHLOROthiazide (MICROZIDE) 12.5 mg Oral Capsule Take 12.5 mg by mouth Once a day   linaclotide (LINZESS) 145  mcg Oral Capsule Take 145 mcg by mouth Once a day   liothyronine (CYTOMEL) 25 mcg Oral Tablet Take 25 mcg by mouth Once a day   magnesium citrate (CITROMA) Oral Solution Take 296 mL by mouth One time Reported on 02/01/2016   medroxyPROGESTERone (PROVERA) 10 mg Oral Tablet Take 1 Tab (10 mg total) by mouth Once a day   Miscellaneous Medical Supply Misc by Does not apply route TRI Chromolean supplement   Pilocarpine HCl 7.5 mg Oral Tablet Take 1 Tab (7.5 mg total) by mouth Twice daily   Potassium 99 mg Tab take by mouth. 2 daily   prenatal vitamin-iron-folate Tablet Take 1 Tab by mouth Once a day   raNITIdine (ZANTAC) 300 mg Oral Tablet Take 300 mg by mouth Every evening   thyroid (ARMOUR THYROID) 60 mg Oral Tablet Take 60 mg by mouth Once a day   VIT E-TOCOTRIENOL GAM,ALPH,DEL ORAL Take by mouth Patient takes 400 IU daily   Zinc 50 mg Tab take by mouth.      No facility-administered medications prior to visit.     CURRENT MEDICATIONS:    Current Outpatient Prescriptions:     aMILoride-hydrochlorothiazide (MODURETIC) 5-50 mg Oral Tablet, Take 1 Tab by mouth Once a day,  Disp: , Rfl:     Ascorbic Acid (VITAMIN C) 1,000 mg Oral Tablet, Take 1,000 mg by mouth Once a day, Disp: , Rfl:     cholecalciferol, vitamin D3, 1,000 unit Oral Tablet, Take 1,000 Units by mouth Once a day Patient takes 2000 IU nightly, Disp: , Rfl:     cyanocobalamin (VITAMIN B12) 250 mcg Oral Tablet, Take 250 mcg by mouth Four times daily with food, Disp: , Rfl:     cycloSPORINE (RESTASIS) 0.05 % Ophthalmic Dropperette, Instill 1 Drop into both eyes Every 12 hours, Disp: , Rfl:     DULoxetine (CYMBALTA DR) 30 mg Oral Capsule, Delayed Release(E.C.), Take 60 mg by mouth Once a day, Disp: , Rfl:     flecainide (TAMBOCOR) 50 mg Oral Tablet, Take 50 mg by mouth Twice daily, Disp: , Rfl:     gabapentin (NEURONTIN) 300 mg Oral Capsule, Take 300 mg by mouth Once a day, Disp: , Rfl:     Glucosamine-Chondroit-Vit C-Mn (GLUCOSAMINE 1500 COMPLEX) 500-400 mg Oral Capsule, Take by mouth, Disp: , Rfl:     Green Tea Leaf Extract (GREEN TEA) Oral Capsule, Take by mouth, Disp: , Rfl:     hydroCHLOROthiazide (MICROZIDE) 12.5 mg Oral Capsule, Take 12.5 mg by mouth Once a day, Disp: , Rfl:     linaclotide (LINZESS) 145 mcg Oral Capsule, Take 145 mcg by mouth Once a day, Disp: , Rfl:     liothyronine (CYTOMEL) 25 mcg Oral Tablet, Take 25 mcg by mouth Once a day, Disp: , Rfl:     magnesium citrate (CITROMA) Oral Solution, Take 296 mL by mouth One time Reported on 02/01/2016, Disp: , Rfl:     medroxyPROGESTERone (PROVERA) 10 mg Oral Tablet, Take 1 Tab (10 mg total) by mouth Once a day, Disp: 10 Tab, Rfl: 0    Miscellaneous Medical Supply Misc, by Does not apply route TRI Chromolean supplement, Disp: , Rfl:     Pilocarpine HCl 7.5 mg Oral Tablet, Take 1 Tab (7.5 mg total) by mouth Twice daily, Disp: 180 Tab, Rfl: 3    Potassium 99 mg Tab, take by mouth. 2 daily, Disp: , Rfl:     prenatal vitamin-iron-folate Tablet, Take 1 Tab by mouth Once a  day, Disp: , Rfl:     raNITIdine (ZANTAC) 300 mg Oral Tablet, Take 300 mg by  mouth Every evening, Disp: , Rfl:     thyroid (ARMOUR THYROID) 60 mg Oral Tablet, Take 60 mg by mouth Once a day, Disp: , Rfl:     VIT E-TOCOTRIENOL GAM,ALPH,DEL ORAL, Take by mouth Patient takes 400 IU daily, Disp: , Rfl:     Zinc 50 mg Tab, take by mouth. 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     [employer]      sp:  Programmer, multimedia           Social History Main Topics    Smoking status: Former Smoker     Packs/day: 0.25     Years: 2.00     Types: Cigarettes     Quit date: 10/22/1991    Smokeless tobacco: Never Used    Alcohol use No    Drug use: No    Sexual activity: Yes     Partners: Male     Birth control/ protection: Vasectomy           Other Topics Concern    Abuse/Domestic Violence No    Breast Self Exam Yes    Caffeine Concern No    Calcium Intake Adequate No    Computer Use Yes    Exercise Concern No    Helmet Use Yes    Seat Belt Yes    Special Diet Yes     low sodium diet    Sunscreen Used No    Uses Gait Assitive Device (Cane, Walker, Etc) No    Right Hand Dominant Yes    Left Hand Dominant No    Ambidextrous No     Social History Narrative       Family history and review of systems are noted on the chart.    PHYSICAL EXAMINATION:      Vitals:    11/27/16 0920   Weight: 70.3 kg (155 lb)   Height: 1.702 m (5\' 7" )       Lung EXAM: Clear to auscultation bilaterally; no wheezes, no rhonchi, and no rales.  Cardiac EXAM:   Regular rate and rhythm; no murmurs, no rubs, and no gallops.  ABDOMINAL EXAM:  no tenderness.  no organomegaly.  Positive bowel sound throughout all four quadrants.    ORTHOPEDIC EVALUATION:    Patient displays an abnormal gait.  Patient favors the rightlower extremity.  Gross inspection reveals normal mechanical and anatomic  axis of the lower extremities with no appreciable leg length discrepancy.  No tenderness found with AP or lateral compression testing of the pelvis.    Right hip shows full PROM (Forward flexion 0-115 degrees, internal rotation 0-35 degrees, external rotation 0-45 degrees). McCarthys Maneuvers are negative for impingement. FABRE testing negative. No tenderness to palpation over the greater trochanter.  Straight leg testing is negative for radiculopathy.  Compartments are supple.  Skin is clear.  +5/5 hip flexors/extenders, as well as, knee flexors and extenders.  Intact sensory exam along all dermatomes.    Left hip shows full PROM (Forward  flexion 0-115 degrees, internal rotation 0-35 degrees, external rotation 0-45 degrees). McCarthys Maneuvers are negative for impingement. FABRE testing negative. No tenderness to palpation over the greater trochanter.  Straight leg testing is negative for radiculopathy.  Compartments are supple.  Skin is clear.  +5/5 hip flexors/extenders, as well as, knee flexors and extenders.  Intact sensory exam along all dermatomes.    Right knee shows full PROM (0-120 degrees).  Patella tracks midline without instability.  Mild tenderness to palpation of the distal femur and proximal tibia. Clarks patella-femoral test is positivefor crepitus and pain.  No effusion is present.  Lachmans test is negative.  Posterior Drawer test is negative.  Collateral ligamental testing is stable at 30 degrees of extension, mid-flexion, and terminal flexion.  McMurrary testing is negative.  No Bakers cyst is present.  Skin is clear.  Normoreflexic.   Left knee shows full PROM (0-120 degrees).  Patella tracks midline without instability.  negative tenderness to palpation of the distal femur and proximal tibia. Clarks patella-femoral test is positivefor crepitus and pain.  No effusion is present.  Lachmans test is negative.  Posterior Drawer test is negative.  Collateral ligamental testing is stable at  30 degrees of extension, mid-flexion, and terminal flexion.  McMurrary testing is negative.  No Bakers cyst is present.  Skin is clear.  Normoreflexic.    Right Ankle shows full PROM in plantar and dorsiflexion.  Stable to ligamental testing.  Skin is clear. +5/5 motor strength to plantar/dorsiflexion, inversion/eversion, and EHL.  Intact sensory to light-touch.  +2/4 dorsalis pedis and posterior tibial pulse.    Left Ankle shows full PROM in plantar and dorsiflexion.  Stable to ligamental testing.  Skin is clear. +5/5 motor strength to plantar/dorsiflexion, inversion/eversion, and EHL.  Intact sensory to light-touch.  +2/4 dorsalis pedis and posterior tibial pulse.          ASSESSMENT:  Right knee osteoarthritis    PLAN:  At this time I have discussed with Mrs. Weir in great detail today the risks, benefits, rationale, expected outcomes and potential complications of undergoing a right total knee arthroplasty.  She does understand the risk of surgery and has given [place] consent to proceed with surgical intervention.  A signed copy of the surgical consent has been signed and a copy has been placed on the chart.  I utilized greater than 30 mintues today discussing with her the procedure and postoperative rehabilitation process for a total knee replacement surgery.  She has received preoperative clearance from her primary care physician George Ina).  Also, preoperative labs have been completed including chest x-ray, EKG, CBC, CMP, nasal swab, urine with culture, and PT/INR.  Mrs. Hanisch will undergo right total knee arthroplasty on 12/10/2016.  We discussed DVT prophylaxis following surgery which may include Lovenox 30mg /11ml twice a day for 14 days to prevent blood clots.  (He She) will follow up with Dr. Toni Amend 3 weeks after surgery for standard postoperative evaluation.  All of her questions were answered to her satisfaction today.    Note reviewed. No changes or addition.  Agree with current  plan.      Nile Dear. Trashaun Streight D.O.          Patient seen and evaluated.  No changes from current History and Physical.  Procedure discussed. All questions pertaining to consent, intraoperative and postoperative goals answered.  Additional questions encouraged and answered.   Proceed with planned procedure.      Nile Dear. Kysean Sweet DO

## 2016-12-11 ENCOUNTER — Non-Acute Institutional Stay (HOSPITAL_BASED_OUTPATIENT_CLINIC_OR_DEPARTMENT_OTHER)
Admission: RE | Admit: 2016-12-11 | Discharge: 2016-12-11 | Disposition: A | Discharge status: Home / Self Care | Payer: Self-pay | Source: Ambulatory Visit

## 2016-12-11 DIAGNOSIS — E785 Hyperlipidemia, unspecified: Secondary | ICD-10-CM

## 2016-12-11 DIAGNOSIS — K219 Gastro-esophageal reflux disease without esophagitis: Secondary | ICD-10-CM

## 2016-12-11 DIAGNOSIS — M1711 Unilateral primary osteoarthritis, right knee: Secondary | ICD-10-CM

## 2016-12-11 DIAGNOSIS — Z96651 Presence of right artificial knee joint: Secondary | ICD-10-CM

## 2016-12-11 LAB — CBC
HCT: 33.6 % — ABNORMAL LOW (ref 34.6–46.2)
HGB: 11.5 g/dL — ABNORMAL LOW (ref 11.8–15.8)
MCH: 29 pg (ref 27.6–33.2)
MCHC: 34.2 g/dL (ref 32.6–35.4)
MCV: 84.9 fL (ref 82.3–96.7)
MCV: 84.9 fL (ref 82.3–96.7)
MPV: 10.3 fL — ABNORMAL HIGH (ref 6.6–10.2)
PLATELETS: 133 x10ˆ3/uL — ABNORMAL LOW (ref 140–440)
RBC: 3.95 x10ˆ6/uL (ref 3.80–5.24)
RDW: 14 % (ref 12.4–15.2)
WBC: 9.1 x10ˆ3/uL (ref 3.5–10.3)

## 2016-12-11 MED ORDER — ALUMINUM-MAG HYDROXIDE-SIMETHICONE 200 MG-200 MG-20 MG/5 ML ORAL SUSP
15.00 mL | ORAL | Status: DC | PRN
Start: 2016-12-11 — End: 2016-12-11

## 2016-12-11 MED ORDER — MAGNESIUM CITRATE ORAL SOLUTION
296.0000 mL | Freq: Once | ORAL | Status: DC
Start: 2016-12-11 — End: 2016-12-11
  Administered 2016-12-11: 0 mL via ORAL
  Filled 2016-12-11: qty 296

## 2016-12-11 MED ORDER — DOCUSATE SODIUM 100 MG CAPSULE
100.00 mg | ORAL_CAPSULE | Freq: Two times a day (BID) | ORAL | Status: DC | PRN
Start: 2016-12-11 — End: 2016-12-11

## 2016-12-11 MED ORDER — SENNOSIDES 8.6 MG-DOCUSATE SODIUM 50 MG TABLET
2.00 | ORAL_TABLET | Freq: Two times a day (BID) | ORAL | Status: DC
Start: 2016-12-11 — End: 2016-12-11
  Administered 2016-12-11: 2 via ORAL
  Filled 2016-12-11 (×4): qty 2

## 2016-12-11 MED ORDER — PILOCARPINE 7.5 MG TABLET
7.50 mg | ORAL_TABLET | Freq: Two times a day (BID) | ORAL | Status: DC
Start: 2016-12-11 — End: 2016-12-11
  Administered 2016-12-11: 7.5 mg via ORAL
  Filled 2016-12-11: qty 6

## 2016-12-11 MED ORDER — LINACLOTIDE 145 MCG CAPSULE
145.0000 ug | ORAL_CAPSULE | Freq: Every day | ORAL | Status: DC
Start: 2016-12-11 — End: 2016-12-11
  Administered 2016-12-11: 0 ug via ORAL
  Filled 2016-12-11: qty 3

## 2016-12-11 MED ORDER — ONDANSETRON HCL (PF) 4 MG/2 ML INJECTION SOLUTION
4.00 mg | Freq: Four times a day (QID) | INTRAMUSCULAR | Status: DC | PRN
Start: 2016-12-11 — End: 2016-12-11

## 2016-12-11 MED ORDER — RIVAROXABAN 10 MG TABLET: 10 mg | Tab | Freq: Every evening | ORAL | 0 refills | 0 days | Status: DC

## 2016-12-11 MED ORDER — DOCUSATE SODIUM 100 MG CAPSULE: 100 mg | Freq: Two times a day (BID) | ORAL | 0 refills | 0 days | Status: DC | PRN

## 2016-12-11 MED ORDER — OXYCODONE 5 MG TABLET
5.0000 mg | ORAL_TABLET | ORAL | 0 refills | Status: DC | PRN
Start: 2016-12-11 — End: 2016-12-25

## 2016-12-11 MED ORDER — FAMOTIDINE 40 MG TABLET
40.00 mg | ORAL_TABLET | Freq: Every evening | ORAL | Status: DC
Start: 2016-12-11 — End: 2016-12-11
  Administered 2016-12-11: 0 mg via ORAL
  Filled 2016-12-11 (×2): qty 1

## 2016-12-11 MED ORDER — DEXTROSE 5 % IN WATER (D5W) INTRAVENOUS SOLUTION
2.0000 g | Freq: Three times a day (TID) | INTRAVENOUS | Status: AC
Start: 2016-12-11 — End: 2016-12-11
  Administered 2016-12-11: 0 g via INTRAVENOUS
  Administered 2016-12-11: 2 g via INTRAVENOUS
  Administered 2016-12-11: 0 g via INTRAVENOUS
  Administered 2016-12-11: 2 g via INTRAVENOUS
  Filled 2016-12-11 (×2): qty 20

## 2016-12-11 MED ADMIN — linaCLOtide 145 mcg capsule: ORAL | @ 09:00:00

## 2016-12-11 MED ADMIN — celecoxib 200 mg capsule: ORAL | @ 09:00:00

## 2016-12-11 MED ADMIN — electrolyte-A intravenous solution: ORAL | @ 17:00:00 | NDC 00338022104

## 2016-12-11 NOTE — Progress Notes (Signed)
Doing well. Pain well controlled. Turned OnQ down to 3cc/hr.

## 2016-12-11 NOTE — Care Plan (Signed)
[facility]  Rehabilitation Services  Physical Therapy Progress Note    Patient Name: Tracie [name]  Date of Birth: 10/08/1966  Height:  170.2 cm (5\' 7" )  Weight:  70.6 kg (155 lb 9.6 oz)  Room/Bed: 5321/A  Payor: AETNA / Plan: AET[employer] NOT MANAGED CARE / Product Type: Non Managed Care /     Assessment:   Lower Extremity Assessment:  RLE Assessment  RLE Assessment: X-Exceptions  RLE ROM: R knee 15 - 80 deg AAROM in supine  RLE Strength: R quads 3-/5, R hams 2+/5  RLE Sensation: no sensation deficits identified  LLE Assessment  LLE Assessment: WFL- Within Functional Limits    Gait Assessment:  Gait Assessment/Treatment  Independence : stand-by assistance  Assistive Device : rolling walker  Distance in Feet: 320  Deviations : step length decreased, stride length decreased, weight-shifting ability decreased  Maintain Weight Bearing Status: able to maintain  Safety Issues : step length decreased, weight-shifting ability decreased  Impairments : pain, ROM decreased, strength decreased  Comment: step through gait, but limited in flexion during swing phase and extension in stance phase    Stair Assessment:  Stairs Assessment/Treatment  Number of Stairs: 12  Handrail Location: both sides  Independence Level: stand-by assistance  Technique Used: step to step (descending), step to step (ascending)  Comment: demos safety during stair negotiation, no issues      Patient progressing well s/p right TKA. She is demosing a safe gait and stair training with just stand by assist. Tolerates TKA ther ex/ROM exercises well without issue despite pain in postior right knee. Pt plans d/c to home this evening    Discharge Needs:   Equipment Recommendation: none anticipated      Discharge Disposition: home with home health        Plan:   Continue to follow patient according to established plan of care.  The risks/benefits of therapy have been discussed with the patient/caregiver and he/she is in agreement with the established  plan of care.     Subjective & Objective:        12/11/16 1330   Therapist Pager   PT Assigned/ Pager # Tobi Bastos [phone]   Rehab Session   Document Type therapy note (daily note)   Total PT Minutes: 28   Patient Effort excellent   General Information   Patient Profile Reviewed? yes   Respiratory Status room air   Weight-bearing Status   Right Lower Extremity  weight-bearing as tolerated (WBAT)   Pre Treatment Status   Pre Treatment Patient Status Patient supine in bed   Support Present Pre Treatment  None   Cognitive Assessment/Interventions   Behavior/Mood Observations behavior appropriate to situation, WNL/WFL;alert;cooperative   Orientation Status  oriented x 4   Attention WNL/WFL   Follows Commands  WNL   Pain Assessment   Pain Scale: Numbers, Pretreatment 5/10   Pain Scale: Numbers, Post-Treatment 5/10   Pain Location - Side Right   Pain Location - Orientation posterior   Pain Location knee   Pre/Post Treatment Pain Comment pt reports increased soreness behind the knee since AM PT session   Mobility Assessment/Training   Additional Documentation Stairs Assessment/Treatment (Group)   Bed Mobility Assessment/Treatment   Supine-Sit Independence independent   Sit to Supine, Independence independent   Transfer Assessment/Treatment   Sit-Stand Independence  independent   Stand-Sit Independence  independent   Sit-Stand-Sit, Assist Device rolling walker   Transfer Impairments pain;strength decreased;ROM decreased   Gait Assessment/Treatment  Independence  stand-by assistance   Assistive Device  rolling walker   Distance in Feet 320   Impairments  pain;ROM decreased;strength decreased   Comment step through gait, but limited in flexion during swing phase and extension in stance phase   Stairs Assessment/Treatment   Number of Stairs 12   Handrail Location both sides   Independence Level stand-by assistance   Technique Used step to step (descending);step to step (ascending)   Comment demos safety during stair negotiation, no  issues   Balance Skill Training   Standing Balance: Dynamic good balance   Therapeutic Exercise/Activity   Lower Extremity  ankle pumps;gastroc stretch, right;hamstring stretch, right;heel slides, right;LAQ (long arc quad), right;quad sets, right;SAQ (short arc quad), right;straight leg raise (SLR), right  (hamstring pulls on RLE)   Weight/Resistance green   Exercise Type AROM (active range of motion);isometric contraction, static;resistive exercises   Position  supine;seated   Sets/Reps  1 x 10   Post Treatment Status   Post Treatment Patient Status Patient supine in bed;Telephone within reach;Call light within reach   Support Present Post Treatment  None   Physical Therapy Clinical Impression   Assessment Patient progressing well s/p right TKA. She is demosing a safe gait and stair training with just stand by assist. Tolerates TKA ther ex/ROM exercises well without issue despite pain in postior right knee. Pt plans d/c to home this evening       Therapist:   Sherri RadAnna Koreski, PTA   Pager #: [phone]

## 2016-12-11 NOTE — Nurses Notes (Signed)
Discharge instructions discussed with patient and family with understanding voiced. Home Health to follow at home for continued physical therapy Masao Junker Wilt, RN

## 2016-12-11 NOTE — Discharge Summary (Signed)
[facility]  DISCHARGE SUMMARY      PATIENT NAME:  Tracie [name], Tracie [name]  MRN:  [id number]  DOB:  10-03-1966    ADMISSION DATE:  12/10/2016  DISCHARGE DATE:  12/11/2016    ATTENDING PHYSICIAN: Lovena Le, DO  PRIMARY CARE PHYSICIAN: Royston Cowper, CFNP     ADMISSION DIAGNOSIS: <principal problem not specified>  Chief Complaint   Patient presents with   . Knee Pain           DISCHARGE DIAGNOSIS: Right Knee OA    Hospital Problems) (* Primary Problem)    Diagnosis Date Noted   . Osteoarthritis of right knee 12/10/2016      Resolved Hospital Problems    Diagnosis Date Noted Date Resolved   No resolved problems to display.     Active Non-Hospital Problems    Diagnosis Date Noted   . Hip bursitis 12/04/2016   . Vitamin D deficiency 12/04/2016   . Acquired hypothyroidism 12/04/2016   . 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 Chronic constipation 12/04/2016   . Small fiber neuropathy (HCC) 12/04/2016   . Other allergic rhinitis 12/04/2016   . MVP (mitral valve prolapse) 12/04/2016   . Routine health maintenance 12/04/2016   . Primary immunodeficiency disorder (HCC) 02/01/2016   . Primary osteoarthritis of right knee 02/01/2016   . Primary osteoarthritis involving multiple joints 02/01/2016   . Fibromyalgia syndrome 02/01/2016   . Psychosocial stressors 11/17/2012   . Acute neck pain 11/17/2012   . Dyspepsia 11/17/2012   . Breast cyst 09/11/2011   . Sjogren's syndrome (HCC) 01/28/2008   . Hyperlipidemia, acquired 01/28/2008   . Atrial paroxysmal tachycardia (HCC) 01/28/2008      DISCHARGE MEDICATIONS:     Current Discharge Medication List      START taking these medications.       Details    docusate sodium 100 mg Capsule   Commonly known as:  COLACE    100 mg, Oral, 2x/day PRN   Refills:  0       oxyCODONE 5 mg Tablet   Commonly known as:  ROXICODONE    5 mg, Oral, Q4H PRN   Qty:  88 Tab   Refills:  0       rivaroxaban 10 mg Tablet   Commonly known as:  XARELTO    10 mg, Oral, Daily with Dinner   Qty:  12 Tab    Refills:  0         CONTINUE these medications - NO CHANGES were made during your visit.       Details    Ascorbic Acid 1,000 mg Tablet   Commonly known as:  VITAMIN C    1,000 mg, Oral, Daily   Refills:  0       cholecalciferol (vitamin D3) 1,000 unit Tablet    1,000 Units, Daily   Refills:  0       cyanocobalamin 250 mcg Tablet   Commonly known as:  VITAMIN B12    250 mcg, 4x/day-Food   Refills:  0       cycloSPORINE 0.05 % Dropperette   Commonly known as:  RESTASIS    1 Drop, Q12H   Refills:  0       DULoxetine 30 mg Capsule, Delayed Release(E.C.)   Commonly known as:  CYMBALTA DR    60 mg, Oral, Daily   Refills:  0       GAMMAGARD LIQUID 10 % Solution infusion  Generic drug:  immun glob G(IgG)-gly-IgA ov50    Intravenous, Once, Mix and infuse per policy of Home [facility]fusion Pharmacy.    Refills:  0       GLUCOSAMINE 1500 COMPLEX 500-400 mg Capsule   Generic drug:  Glucosamine-Chondroit-Vit C-Mn    Oral   Refills:  0       GREEN TEA Capsule   Generic drug:  Green Tea Leaf Extract    Take by mouth   Refills:  0       hydroCHLOROthiazide 12.5 mg Capsule   Commonly known as:  MICROZIDE    12.5 mg, Oral, Daily   Refills:  0       LINZESS 145 mcg Capsule   Generic drug:  linaclotide    145 mcg, Oral, Daily   Refills:  0       liothyronine 25 mcg Tablet   Commonly known as:  CYTOMEL    25 mcg, Oral, Daily   Refills:  0       magnesium citrate Solution   Commonly known as:  CITROMA    296 mL, Oral, Once, Reported on 02/01/2016   Refills:  0       Miscellaneous Medical Supply Misc    Does not apply, TRI Chromolean supplement    Refills:  0       multivitamin Tablet    1 Tab, Oral, Daily   Refills:  0       pilocarpine 7.5 mg Tablet   Commonly known as:  SALAGEN    7.5 mg, Oral, 2x/day   Qty:  180 Tab   Refills:  3       potassium chloride 20 mEq Tab Sust.Rel. Particle/Crystal   Commonly known as:  K-DUR    20 mEq, Oral, Daily   Refills:  0       raNITIdine 300 mg Tablet   Commonly known as:  ZANTAC    300 mg, Oral, QPM    Refills:  0       vitamin E 400 unit Capsule    400 Units, Oral, Daily   Refills:  0       zinc 50 mg Tablet    take by mouth.    Refills:  0         STOP taking these medications.          aMILoride-hydroCHLOROthiazide 5-50 mg Tablet   Commonly known as:  MODURETIC       ARMOUR THYROID 60 mg Tablet   Generic drug:  thyroid       flecainide 50 mg Tablet   Commonly known as:  TAMBOCOR       gabapentin 300 mg Capsule   Commonly known as:  NEURONTIN       Ibuprofen 600 mg Tablet   Commonly known as:  MOTRIN       medroxyPROGESTERone 10 mg Tablet   Commonly known as:  PROVERA       ondansetron 4 mg Tablet, Rapid Dissolve   Commonly known as:  ZOFRAN ODT       Potassium 99 mg Tablet       prenatal vitamin-iron-folate Tablet       verapamil 40 mg Tablet   Commonly known as:  CALAN       VIT E-TOCOTRIENOL GAM,ALPH,DEL ORAL           DISCHARGE INSTRUCTIONS:     DISCHARGE INSTRUCTION - DIET   Please resume a healthy, well-balanced diet upon  discharge.   Diet: Z - other (specify in comments) Please resume a healthy, well-balanced diet upon discharge.     DISCHARGE INSTRUCTION - DVT (BLOOD CLOTS) PREVENTION   -Swelling of the operative leg is normal after discharge because you will be more active at home. If however you develop new pain in the groin, thigh, or calf and if swelling is unrelieved with elevation, this may be a sign of a DVT (blood clot). Please notify your surgeon immediately if this occurs or go the nearest ER to have an ultrasound of your leg.    -Bruising of the operative leg is normal and sometimes the bruising travels all the way to the ankle, foot and toes.  This will gradually go from purple to green to yellow before it disappears completely.      -Additional DVT prevention will be TED hose (stockings) for 4 weeks. These should be worn for at least 18 hours per day. They may be washed if they become soiled.    -You have consented to participate in the PEPPER study. For this study, you are to take the VTE  prophylaxis (blood clot prevention) listed below for 12 days following your surgery.  - Rivaroxaban is being used to prevent DVT (blood clots). Please take Rivaroxaban 10 mg every evening for 12 days. Please attempt to take this medication at the same time every day.     DISCHARGE INSTRUCTION - TUB/ SHOWER BENCH   Consider purchasing a tub/shower bench, usually NOT covered by insurance company.     DISCHARGE INSTRUCTION - INCISION/WOUND CARE   STANDARD INSTRUCTIONS- Change the bandage daily with dry gauze. Once the bandage is clean and dry for two consecutive days you may leave the incision uncovered and may shower and wet the incision. You may NOT submerge the incision until told otherwise by your surgeon (no swimming or tub bathing).  - Do not apply creams, lotions, salves or medicated ointments to the incision.  Simply apply dry gauze and nothing more.  - Please call me or your orthopedic nurse clinician for ANY DRAINAGE that occurs more than 7 days after surgery   Instructions for incision/wound care: Wound Care as Instructed      DISCHARGE INSTRUCTION - EXERCISE AND RESTRICTIONS   - No driving until further notice. The decision to commence driving will be decided at your first postoperative visit.  - Resume your usual home medications when you get home unless otherwise indicated at the time of discharge.  - Smoking raises one's risk of post-operative infection, wound healing problems, and DVT (blood clots). If you are a smoker and cannot quit permanently, it is best that you wait at least four weeks from surgery before you resume smoking.  KNEE REPLACEMENT:  - No weightlifting (resistive exercise) with the operative leg for the first 6 weeks.  -Ideally there will be no delay from the time you leave the hospital to the time you start supervised therapy.  - To prevent stiffness, it is very important that you exercise the new knee several times during the day, especially on the days that you don't have supervised  physical therapy.    -It is very important that you attempt to fully extend (straighten) and flex (bend) the knee as much as possible to obtain the best possible result from your knee replacement.     - It is normal for your knee to make noise after surgery.  Clicking and popping is to be expected and it is usually permanent.  -  It is normal for the outside of the knee incision to feel numb after surgery.  This will become less noticeable over several months.  - Achieving 90 degrees of knee flexion within 2 weeks of surgery is a reasonable milestone.     DISCHARGE INSTRUCTION - FALL PREVENTION   A fall after surgery can be devastating.  Falls can result in dislocation, rupturing a ligament or tendon, or breaking a bone around the new prosthesis. These injuries can be extremely difficult to repair and often require a revision of the entire prosthesis.  Revision operations are associated with a higher complication rate.  Therefore it is crucial that you avoid falling after surgery.  Many falls are caused by poor lighting, a throw rug, a small pet, or too much haste.  Please take time to remove obstacles, secure pets somewhere safe, and improve lighting at home with the use of properly placed nightlights.     DISCHARGE INSTRUCTION - SIGNS/SYMPTOMS OF POST OPERATIVE INFECTION   -Increasing pain, redness, warmth, shaking chills, sweats or fever may be a sign of a post-operative infection.  -A fever is not uncommon the first three days after surgery. A fever of 100.5 or higher more than 72 hours after surgery is a concern and you should notify us if this occurs.  - Please inspect the incision every day. A little spotting on the dressing is normal. Drainage from the incision that persists more than 7 days after surgery may be a sign of infection and requires my immediate attention.   -If you have questions or concerns please call [phone] or call [phone].  -No one should place you on an antibiotic for signs of a  surgical site infection without first consulting the surgeon.   - No dental procedures for 6 weeks as this can cause infection in your new joint replacement  - You must receive antibiotics immediately prior to dental visits and procedures like colonoscopy and cystoscopy. This is a life-long recommendation.     DISCHARGE INSTRUCTION - POST-OPERATIVE PAIN   Ice may be applied 20 minutes at a time every 2 hours as needed.  Be careful not to apply ice to bare skin as ice may cause burns just like heat.  - PRESCRIPTION NARCOTIC PAIN RELIEVERS ARE HABIT FORMING.  THEY SHOULD BE RESERVED FOR MODERATE TO SEVERE PAIN.  - Prescription pain relievers are constipating for most people.  Be prepared with over the counter remedies like Milk of Magnesia, Prune Juice, Hot Tea, etc.  Fleets enemas and suppositories are available over the counter as well.     DISCHARGE INSTRUCTION - ACTIVITY/ WEIGHT BEARING   Weight Bearing Status: WBAT         REASON FOR HOSPITALIZATION AND HOSPITAL COURSE:  This is a 51 y/o., Female who has a long standing history of right knee OA.  she elected to proceed with a right TKA after failing all conservative options.  she underwent a right TKA on 12/10/2016.  Intra-operatively their course was uncomplicated.  she was started on DVT prophylaxis following surgery.  She has continued to progress well with physical therapy and is now WBAT using a FWW.  She has been able to gait ambulate greater than 150 feet.        SIGNIFICANT PHYSICAL FINDINGS: None  SIGNIFICANT LAB: None  SIGNIFICANT RADIOLOGY: Right Knee OA  CONSULTATIONS: John Angotti  PROCEDURES PERFORMED: Right TKA        COURSE IN HOSPITAL: Stable    DOES PATIENT HAVE  ADVANCED DIRECTIVES:  No, Information Offered and Given (wants to be FULL CODE)    ADVANCED CARE PLANNING - Not applicable for this patient    CONDITION ON DISCHARGE: Alert, Oriented and VS Stable    DISCHARGE DISPOSITION:  Home discharge     Copies sent to Care Team       Relationship  Specialty Notifications Start End    Royston Cowper, [place] PCP - [facility]  08/22/15     Phone: [phone] Fax: [phone]         [address] [facility] [place] [address]            Launa Flight, PA-C

## 2016-12-11 NOTE — Care Plan (Signed)
[facility]  Rehabilitation Services  Physical Therapy Progress Note    Patient Name: Tracie [name]  Date of Birth: September 01, 1966  Height:  170.2 cm (5\' 7" )  Weight:  70.6 kg (155 lb 9.6 oz)  Room/Bed: 5321/A  Payor: AETNA / Plan: AETNA NOT MANAGED CARE / Product Type: Non Managed Care /     Assessment:   Lower Extremity Assessment:  RLE Assessment  RLE Assessment: X-Exceptions  RLE ROM: R knee 15 - 80 deg AAROM in supine  RLE Strength: R quads 3-/5, R hams 2+/5  RLE Sensation: no sensation deficits identified  LLE Assessment  LLE Assessment: WFL- Within Functional Limits    Gait Assessment:  Gait Assessment/Treatment  Independence : supervision required, verbal cues required  Assistive Device : rolling walker  Distance in Feet: 320  Deviations : step length decreased, stride length decreased, weight-shifting ability decreased  Maintain Weight Bearing Status: able to maintain  Safety Issues : step length decreased, weight-shifting ability decreased  Impairments : pain, strength decreased, ROM decreased  Comment: Pt able to have step through cadence w/ cueing. R knee flexion a little limited in swing phase.     Stair Assessment:         Pt doing well w/ mobility - able to be indep w/ fww. Adequate quads fxn for POD1. ROM limited by pain as expected. Will see pt in PM to cont work on same and also train on stairs.    Discharge Needs:   Equipment Recommendation: none anticipated    Discharge Disposition: home with home health    JUSTIFICATION OF DISCHARGE RECOMMENDATION   Based on current diagnosis, functional performance prior to admission, and current functional performance, this patient requires continued PT services in home with home health in order to achieve significant functional improvements in these deficit areas: gait, locomotion, and balance.      Plan:   Continue to follow patient according to established plan of care.  The risks/benefits of therapy have been discussed with the patient/caregiver  and he/she is in agreement with the established plan of care.     Subjective & Objective:        12/11/16 1030   Therapist Pager   PT Assigned/ Pager # Helmut Muster [phone]   Rehab Session   Document Type therapy note (daily note)   Total PT Minutes: 26   Patient Effort excellent   Symptoms Noted During/After Treatment increased pain   General Information   Patient Profile Reviewed? yes   Patient/Family Observations Pt states she has taken her pain meds this am in anticipation of PT. States she's been getting up on her own with the fww.   Respiratory Status room air   Weight-bearing Status   Right Lower Extremity  weight-bearing as tolerated (WBAT)   Pre Treatment Status   Pre Treatment Patient Status Patient supine in bed   Support Present Pre Treatment  None   Cognitive Assessment/Interventions   Behavior/Mood Observations behavior appropriate to situation, WNL/WFL;alert   Orientation Status  oriented x 4   Attention WNL/WFL   Follows Commands  WNL   Pain Assessment   Pain Scale: Numbers, Pretreatment 2/10   Pain Scale: Numbers, Post-Treatment 4/10   Pain Location - Side Right   Pain Location - Orientation posterior   Pain Location knee   RLE Assessment   RLE Assessment X-Exceptions   RLE ROM R knee 15 - 80 deg AAROM in supine   RLE Strength R quads 3-/5, R hams  2+/5   RLE Sensation no sensation deficits identified   Bed Mobility Assessment/Treatment   Supine-Sit Independence independent   Sit to Supine, Independence independent   Transfer Assessment/Treatment   Sit-Stand Independence  modified independence   Stand-Sit Independence  modified independence   Sit-Stand-Sit, Assist Device rolling walker   Transfer Impairments pain;ROM decreased   Gait Assessment/Treatment   Independence  supervision required;verbal cues required   Assistive Device  rolling walker   Distance in Feet 320   Impairments  pain;strength decreased;ROM decreased   Comment Pt able to have step through cadence w/ cueing. R knee flexion a little limited  in swing phase.    Motor Skills/Interventions   Additional Documentation Therapeutic Exercise (Group)   Therapeutic Exercise/Activity   Lower Extremity  ankle pumps;heel slides, right;quad sets, right;SAQ (short arc quad), right;straight leg raise (SLR), right   Lower Extremity Range of Motion  hip flexion-extension, right;knee flexion-extension, right;ankle dorsiflexion-plantar flexion, bilateral   Exercise Type AAROM (active assistive range of motion);AROM (active range of motion)   Position  supine   Sets/Reps  1 set 10 reps each   Post Treatment Status   Post Treatment Patient Status Patient sitting in bedside chair or w/c   Support Present Post Treatment  Family present   Plan of Care Review   Plan Of Care Reviewed With patient   Physical Therapy Clinical Impression   Assessment Pt doing well w/ mobility - able to be indep w/ fww. Adequate quads fxn for POD1. ROM limited by pain as expected. Will see pt in PM to cont work on same and also train on stairs.       Therapist:   Wynonia MustyAlicia Giankarlo Leamer, PT   Pager [phone]#:[phone]

## 2016-12-11 NOTE — Care Management Notes (Signed)
[facility] Initial Evaluation    Patient Name: Tracie [name]  Date of Birth: 1966-07-11  Sex: female  Date/Time of Admission: 12/10/2016 10:05 AM  Room/Bed: 5321/A  Payor: [place] Falling / Plan: AETNA NOT MANAGED CARE / Product Type: Non Managed Care /   PCP: Reginal Lutes, [place] Info:   Preferred Pharmacy     [facility], [address]    [address] [place] [address]    Phone: [phone] Fax: [phone]    Not a 24 hour pharmacy; exact hours not known    [place] [place]    [place] [address]    Phone: [phone] Fax: [phone]    Not a 24 hour pharmacy; exact hours not known        Emergency Contact Info:   Extended Emergency Contact Information  Primary Emergency Contact: Maryclare Labrador  Address: [address] [place], [place] [address] Johnnette Litter of [place] Phone: [phone]  Work Phone: [phone]  Mobile Phone: [phone]  Relation: Husband    History:   Tracie [name] is a 51 y.o., female, admitted for R Knee Replacement    Height/Weight: 170.2 cm (_0 ) / 70.6 kg (155 lb 9.6 oz)     LOS: 0 days   Admitting Diagnosis: Osteoarthritis of right knee [M17.11]  Osteoarthritis of right knee [M17.11]    Assessment: SW met with pt to assess her PLOF & dc needs. Per pt, she lives with her husband in an apt. She reports being independent with ADL's & ambuation. 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 Pt plans to dc home with home health. FOC provided, signed & placed on the chart for [facility]. Pt will also require Xarelto 10 mg daily X 12 days.   SW contacted [facility]([phone])to request the the Xarelto script be run for coverage & co pay. Per Myriam Jacobson, this will require a Prior Auth.   SW contacted [employer]  [employer]([phone]) to obtain PA. Josem Kaufmann #[id number] approved from 11/11/16-02/09/17. SW contacted [facility] ton notify him that Josem Kaufmann had been obtained. Per Myriam Jacobson, pt's co pay will be $49.71  SW referred pt to [facility] for PT & nursing. Rn to call report & fax dc orders.     12/11/16 1531   Assessment Detail   Assessment Type Admission   Social Work Plan   Discharge Planning Status discharge plan complete   CM will evaluate for rehabilitation potential yes   Patient choice offered to patient/family yes   Form for patient choice reviewed/signed and on chart yes   Facility or Agency Preferences [facility]   Discharge Needs Assessment   Equipment Currently Used at [employer], rolling;shower chair   Equipment Needed After Discharge none   Discharge Facility/Level Of Care Needs Home with Home Health (code 6)   Transportation Available car;family or friend will provide   Referral Information   Admission Type inpatient   Arrived From home or self-care   Employment/Financial   Financial Concerns none   Patient has Prescription Coverage?  Yes       Name of Insurance Coverage for  Medications [place] With spouse   Living Arrangements apartment   Able to Return to Prior Arrangements yes   Living Arrangement Comments Lives wtih husband in single story apt   Home Safety   Home Assessment: No Problems Identified   Functional Status Current   Ambulation 1 - assistive equipment   Transferring 1 - assistive equipment   Toileting 1 - assistive equipment   Bathing 2 - assistive person   Dressing 2 - assistive person   Eating 0 - independent   Communication 0 - understands/communicates without difficulty   Functional Status Prior   Ambulation 0 - independent   Transferring 0 - independent   Toileting 0 - independent   Bathing 0 - independent   Dressing 0 - independent   Eating 0 - independent   Communication 0 - understands/communicates without difficulty       Discharge Plan:  Home with [facility] (code 6)  DC home with [facility]    The patient will continue to be evaluated for developing discharge needs.     Case Manager: Guadelupe Sabin, SOCIAL WORKER  Phone: [phone]

## 2016-12-11 NOTE — Progress Notes (Signed)
[facility]  Orthopaedics Progress Note    Ortho Provider: Nile Dearhristopher D. Toni Amendourtney DO    Date of service: 12/11/2016  Post Op Day: 1 Day Post-Op S/P  Right TKA    Subjective:     Patient seen and examined at bedside.  Expected postoperative pain. No reported nausea or vomiting.        Objective:    Vital Signs:  Temp (24hrs) Max:37.3 C (99.1 F)      Temperature: 36.6 C (97.9 F) (12/11/16 0500)  Heart Rate: 71 (12/11/16 0500)  BP (Non-Invasive): (!) 107/56 (12/11/16 0500)  Respiratory Rate: 18 (12/11/16 0500)  SpO2-1: 96 % (12/11/16 0500)  Pain Score (Numeric, Faces): 3 (12/11/16 16100635)    Current Medications:    Current Facility-Administered Medications:  acetaminophen (TYLENOL) tablet 1,000 mg Oral Q6HRS   aluminum-magnesium hydroxide-simethicone (MAG-AL PLUS) 200-200-20 mg per 5 mL oral liquid 15 mL Oral Q4H PRN   bupivacaine (MARCAINE) 0.125 % in SW 550 mL (tot vol) local infiltration - ON Q pump  Local Infiltration Continuous   ceFAZolin (ANCEF) 2 g in D5W 50 mL IVPB 2 g Intravenous Q8H   celecoxib (CELEBREX) capsule 200 mg Oral 2x/day   D5W 1/2 NS 1000 mL with potassium chloride 20 mEq premix infusion  Intravenous Continuous   dexamethasone 4 mg/mL injection 10 mg Intravenous Once   docusate sodium (COLACE) capsule 100 mg Oral 2x/day PRN   DULoxetine (CYMBALTA) delayed release capsule 60 mg Oral QPM   famotidine (PEPCID) tablet 40 mg Oral QPM   gabapentin (NEURONTIN) capsule 300 mg Oral QPM   hydroCHLOROthiazide (HYDRODIURIL) tablet 12.5 mg Oral QPM   linaclotide Capsule 145 mcg 145 mcg Oral Daily   liothyronine (CYTOMEL) tablet 25 mcg Oral QPM   LR premix infusion  Intravenous Continuous   magnesium citrate (CITROMA) oral liquid 296 mL Oral Once   morphine 2 mg/mL injection 2 mg Intravenous Q2H PRN   NS flush syringe 3 mL Intracatheter Q8HRS   NS flush syringe 3 mL Intracatheter Q1H PRN   ondansetron (ZOFRAN) 2 mg/mL injection 4 mg Intravenous Q8HRS   ondansetron (ZOFRAN) 2 mg/mL injection 4 mg  Intravenous Q6H PRN   oxyCODONE (ROXICODONE) immediate release tablet 5 mg Oral Q4H PRN   oxyCODONE (ROXICODONE) immediate release tablet 10 mg Oral Q4H PRN   pilocarpine (SALAGEN) tablet 7.5 mg Oral 2x/day   rivaroxaban (XARELTO) tablet 10 mg Oral Daily with Dinner   sennosides-docusate sodium (SENOKOT-S) 8.6-50mg  per tablet 2 Tab Oral 2x/day       Today's Physical Exam:    Incisions/Dressing:  well healing, without drainageclean, dry and intact  Neurovascular:  Baseline and intact  Calve: Supple    Labs Reviewed  Lab Results   Component Value Date    HGB 11.5 (L) 12/11/2016    HGB 14.6 02/21/2015    HCT 33.6 (L) 12/11/2016    HCT 43.6 02/21/2015    INR 0.96 11/27/2016    INR 0.9 01/28/2008         Radiology Tests   Post op xrays reviewed    Assessment    1.  POD#1 Right total knee arthroplasty      Active Hospital Problems   (*Primary Problem)    Diagnosis   . Osteoarthritis of right knee       Plan:    1.  PT/OT: Per routine TKA protocol  2.  WEIGHT BEARING: Weight bearing as tolerates  3.  DVT/PE:  Mechanical with Thigh High Ted hose  and SCD's; Rivaroxaban  4.  MEDICAL MANAGEMENT: John Angotti  5. Incentive Spirometry  6. Post Operative goals reviewed.  7. Questions encouraged and answered.      Disposition Planning: Home discharge        Launa Flight, [place] 12/11/2016, 08:44

## 2016-12-11 NOTE — Care Plan (Signed)
Problem: Patient Care Overview (Adult,OB)  Goal: Plan of Care Review(Adult,OB)  The patient and/or their representative will communicate an understanding of their plan of care   Outcome: Ongoing (see interventions/notes)  Goal: Individualization/Patient Specific Goal(Adult/OB)  Outcome: Ongoing (see interventions/notes)  Goal: Interdisciplinary Rounds/Family Conf  Outcome: Ongoing (see interventions/notes)    Problem: Fall Risk (Adult)  Goal: Identify Related Risk Factors and Signs and Symptoms  Related risk factors and signs and symptoms are identified upon initiation of Human Response Clinical Practice Guideline (CPG).   Outcome: Ongoing (see interventions/notes)  Goal: Absence of Falls  Patient will demonstrate the desired outcomes by discharge/transition of care.   Outcome: Ongoing (see interventions/notes)    Problem: Pain, Acute (Adult)  Goal: Identify Related Risk Factors and Signs and Symptoms  Related risk factors and signs and symptoms are identified upon initiation of Human Response Clinical Practice Guideline (CPG).   Outcome: Ongoing (see interventions/notes)  Goal: Acceptable Pain Control/Comfort Level  Patient will demonstrate the desired outcomes by discharge/transition of care.   Outcome: Ongoing (see interventions/notes)

## 2016-12-11 NOTE — Care Plan (Signed)
Pt AxO and has been up with walker x1, pain controlled with on-q pump and PRN pain medicine. Pt tolerating CPM at 65 degrees this morning. Pt remains free of falls and is resting in bed at this time. Bonnita Levanhelsey Linh Johannes, RN

## 2016-12-11 NOTE — Consults (Signed)
[facility]  General  History and Physical    Inboden,Annaelle M  Date of Admission:  12/10/2016  Date of Birth:  1966/07/04      PCP: Royston Cowper, CFNP  Chief Complaint:  Knee pain       HPI: DARLIS WRAGG is a 51 y.o., White female who presents with osteoarthritis status post right total knee arthroplasty per Dr. Toni Amend.  Pain management "good".    Patient Active Problem List    Diagnosis Date Noted   . Osteoarthritis of right knee 12/10/2016   . Hip bursitis 12/04/2016   . Vitamin D deficiency 12/04/2016   . Acquired hypothyroidism 12/04/2016   . 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 Chronic constipation 12/04/2016   . Small fiber neuropathy (HCC) 12/04/2016   . Other allergic rhinitis 12/04/2016   . MVP (mitral valve prolapse) 12/04/2016   . Routine health maintenance 12/04/2016   . Primary immunodeficiency disorder (HCC) 02/01/2016   . Primary osteoarthritis of right knee 02/01/2016   . Primary osteoarthritis involving multiple joints 02/01/2016   . Fibromyalgia syndrome 02/01/2016   . Psychosocial stressors 11/17/2012   . Acute neck pain 11/17/2012   . Dyspepsia 11/17/2012   . Breast cyst 09/11/2011   . Sjogren's syndrome (HCC) 01/28/2008   . Hyperlipidemia, acquired 01/28/2008   . Atrial paroxysmal tachycardia (HCC) 01/28/2008       Past Medical History:   Diagnosis Date   . Abnormal Pap smear 2001    LSIL   . Acute neck pain 2014     muscle relaxant med   . Arthritis    . Breast cyst     bilateral, multiple, variable sizes.    . Chest pain 01/28/2008    work-up negative   . Dyspepsia 2014    Zantac med   . Dysplasia of cervix 2001    Colpo CIN 1   . Esophageal reflux    . Hypothyroidism    . MVP (mitral valve prolapse) 12/04/2016   . Other allergic rhinitis 12/04/2016   . Psychosocial stressors 2013 - 2014    . Sjogren's disease    . Thyroid disorder    . Unspecified breast disorder 45409 - present     large breast cysts    . Valvular disease            Past Surgical History:   Procedure Laterality  Date   . BIOPSY BREAST  10/12/03; 11/03/07,  04/28/2012     left breast aspirations x 2 - 2004 - 15 ml turbid and 2009 - 3 ml ; 04/28/2012 Right with rare calcifications.   . HX BREAST BIOPSY Right     needle bx.  age 35   . HX CYST INCISION AND DRAINAGE Left     aspiration   . HX DILATION AND CURETTAGE     . HX TONSILLECTOMY  1974           Medications Prior to Admission     Prescriptions    aMILoride-hydrochlorothiazide (MODURETIC) 5-50 mg Oral Tablet    Take 1 Tab by mouth Once a day    Ascorbic Acid (VITAMIN C) 1,000 mg Oral Tablet    Take 1,000 mg by mouth Once a day    cholecalciferol, vitamin D3, 1,000 unit Oral Tablet    Take 1,000 Units by mouth Once a day Patient takes 2000 IU nightly    cyanocobalamin (VITAMIN B12) 250 mcg Oral Tablet    Take 250 mcg  by mouth Four times daily with food    cycloSPORINE (RESTASIS) 0.05 % Ophthalmic Dropperette    Instill 1 Drop into both eyes Every 12 hours    DULoxetine (CYMBALTA DR) 30 mg Oral Capsule, Delayed Release(E.C.)    Take 60 mg by mouth Once a day    flecainide (TAMBOCOR) 50 mg Oral Tablet    Take 50 mg by mouth Twice daily    gabapentin (NEURONTIN) 300 mg Oral Capsule    Take 300 mg by mouth Once a day    Glucosamine-Chondroit-Vit C-Mn (GLUCOSAMINE 1500 COMPLEX) 500-400 mg Oral Capsule    Take by mouth    Green Tea Leaf Extract (GREEN TEA) Oral Capsule    Take by mouth    hydroCHLOROthiazide (MICROZIDE) 12.5 mg Oral Capsule    Take 12.5 mg by mouth Once a day    Ibuprofen (MOTRIN) 600 mg Oral Tablet    Take 600 mg by mouth Every evening    immun glob G,IgG,-gly-IgA ov50 (GAMMAGARD LIQUID) 10 % Injection Solution infusion    by Intravenous route One time Mix and infuse per policy of [facility].    linaclotide (LINZESS) 145 mcg Oral Capsule    Take 145 mcg by mouth Once a day    liothyronine (CYTOMEL) 25 mcg Oral Tablet    Take 25 mcg by mouth Once a day    magnesium citrate (CITROMA) Oral Solution    Take 296 mL by mouth One time Reported on 02/01/2016     medroxyPROGESTERone (PROVERA) 10 mg Oral Tablet    Take 1 Tab (10 mg total) by mouth Once a day    Miscellaneous Medical Supply Misc    by Does not apply route TRI Chromolean supplement    multivitamin Oral Tablet    Take 1 Tab by mouth Once a day    ondansetron (ZOFRAN ODT) 4 mg Oral Tablet, Rapid Dissolve    Take 4 mg by mouth Every 8 hours as needed for nausea/vomiting    Pilocarpine HCl 7.5 mg Oral Tablet    Take 1 Tab (7.5 mg total) by mouth Twice daily    Potassium 99 mg Tab    take by mouth. 2 daily    potassium chloride (K-DUR) 20 mEq Oral Tab Sust.Rel. Particle/Crystal    Take 20 mEq by mouth Once a day    prenatal vitamin-iron-folate Tablet    Take 1 Tab by mouth Once a day    raNITIdine (ZANTAC) 300 mg Oral Tablet    Take 300 mg by mouth Every evening    thyroid (ARMOUR THYROID) 60 mg Oral Tablet    Take 60 mg by mouth Once a day    verapamil (CALAN) 40 mg Oral Tablet    Take 40 mg by mouth Once a day    VIT E-TOCOTRIENOL GAM,ALPH,DEL ORAL    Take by mouth Patient takes 400 IU daily    vitamin E 400 unit Oral Capsule    Take 400 Units by mouth Once a day    Zinc 50 mg Tab    take by mouth.           Current Facility-Administered Medications:  acetaminophen (TYLENOL) tablet 1,000 mg Oral Q6HRS   aluminum-magnesium hydroxide-simethicone (MAG-AL PLUS) 200-200-20 mg per 5 mL oral liquid 15 mL Oral Q4H PRN   bupivacaine (MARCAINE) 0.125 % in SW 550 mL (tot vol) local infiltration - ON Q pump  Local Infiltration Continuous   ceFAZolin (ANCEF) 2 g in D5W 50  mL IVPB 2 g Intravenous Q8H   celecoxib (CELEBREX) capsule 200 mg Oral 2x/day   D5W 1/2 NS 1000 mL with potassium chloride 20 mEq premix infusion  Intravenous Continuous   dexamethasone 4 mg/mL injection 10 mg Intravenous Once   docusate sodium (COLACE) capsule 100 mg Oral 2x/day PRN   DULoxetine (CYMBALTA) delayed release capsule 60 mg Oral QPM   famotidine (PEPCID) tablet 40 mg Oral QPM   gabapentin (NEURONTIN) capsule 300 mg Oral QPM   hydroCHLOROthiazide  (HYDRODIURIL) tablet 12.5 mg Oral QPM   linaclotide Capsule 145 mcg 145 mcg Oral Daily   liothyronine (CYTOMEL) tablet 25 mcg Oral QPM   LR premix infusion  Intravenous Continuous   magnesium citrate (CITROMA) oral liquid 296 mL Oral Once   morphine 2 mg/mL injection 2 mg Intravenous Q2H PRN   NS flush syringe 3 mL Intracatheter Q8HRS   NS flush syringe 3 mL Intracatheter Q1H PRN   ondansetron (ZOFRAN) 2 mg/mL injection 4 mg Intravenous Q8HRS   ondansetron (ZOFRAN) 2 mg/mL injection 4 mg Intravenous Q6H PRN   oxyCODONE (ROXICODONE) immediate release tablet 5 mg Oral Q4H PRN   oxyCODONE (ROXICODONE) immediate release tablet 10 mg Oral Q4H PRN   pilocarpine (SALAGEN) tablet 7.5 mg Oral 2x/day   rivaroxaban (XARELTO) tablet 10 mg Oral Daily with Dinner   sennosides-docusate sodium (SENOKOT-S) 8.6-50mg  per tablet 2 Tab Oral 2x/day       Allergies   Allergen Reactions   . Augmentin [Amoxicillin-Pot Clavulanate]      Extreme constipation    . Synthroid [Levothyroxine]      Leg pain     . Tetracycline        Social History   Substance Use Topics   . Smoking status: Former Smoker     Packs/day: 0.25     Years: 2.00     Types: Cigarettes     Quit date: 10/22/1991   . Smokeless tobacco: Never Used   . Alcohol use No       Family History:    Family Medical History     Problem Relation (Age of Onset)    Breast Cancer Maternal Aunt    Cancer Son    Diabetes Paternal Grandmother    Healthy Mother    Heart Attack Son    Heart Disease Paternal Grandfather, Father    Hypertension Paternal Grandfather, Father    Leukemia Maternal Uncle    Other Mother, Sister    Uterine Fibroids Mother              ROS: Cardiovascular-the patient denies chest pain, palpitations, PND, orthopnea, or peripheral edema.  Pulmonary-patient denies shortness of breath, dyspnea on exertion, cough, sputum production or hemoptysis.  Neurologic-patient denies hemiparesis, paresthesias, dysesthesias, dysarthrias, balance problems, syncopal episodes, seizure  disorder or headache.  GI-patient denies nausea, vomiting, hematemesis, melena, hematochezia, diarrhea, constipation or other abdominal complaints.  GU-patient denies dysuria, hematuria, urinary frequency, kidney stone, kidney infection, flank pain, fever, chills, diaphoresis or night sweats.  Endocrine-patient denies change in appetite, thirst, weight or temperature intolerance.  Musculoskeletal-the patient denies arthralgias, myalgias, myopathies or weakness.  Hematologic-patient denies any clotting abnormalities, bleeding diatheses or bruising nor any blood dyscrasias.      DNR Status:  Full Code    EXAM:  Temperature: 36.6 C (97.9 F)  Heart Rate: 71  BP (Non-Invasive): (!) 107/56  Respiratory Rate: 18  SpO2-1: 96 %  Pain Score (Numeric, Faces): 5  Constitutional: no distress  Eyes: Conjunctiva clear.  ENT: Mouth mucous membranes dry.   Neck: supple, symmetrical, trachea midline and no adenopathy  Respiratory: Clear to auscultation bilaterally.   Cardiovascular: regular rate and rhythm, S1, S2 normal, no murmur, click, rub or gallop  No JVD  Homans sign is negative, no sign of DVT  pedal edema 0+ bilateral  Gastrointestinal: Soft, non-tender, Bowel sounds normal, non-distended, No hepatosplenomegaly, No masses, Abdominal bruit absent  Genitourinary: Deferred  Musculoskeletal: Grossly unremarkable  Integumentary:  Skin warm and dry  Neurologic: Grossly normal  Lymphatic/Immunologic/Hematologic: No lymphadenopathy  Psychiatric: Normal            Labs:    I have reviewed all lab results.    Imaging Studies:  N/A    DVT RISK FACTORS HAVE BEN ASSESSED AND PROPHYLAXIS ORDERED (SEE [facility] ONLINE - REFERENCE TOOLS - MD, DVT PROPHY OR POCKET CARD)    Assessment/Plan:   Osteoarthritis status post right total knee arthroplasty per Dr. Toni Amend.  Close follow-up on pain management and adjust accordingly so the patient may participate in physical therapy.  Hyperlipidemia -stable, continue as is   Paroxysmal atrial tachycardia  -stable, continue as is   Gastroesophageal reflux disease -stable, continue as is   Hypothyroidism -stable, continue as is   Hypertension -stable, continue as is   Sjogren syndrome -stable, continue as is   Peptic ulcer disease DVT and atelectasis prophylaxis  Discharge plans per Dr. Toni Amend. If discharged, would hold NSAID's and continue Protonix while on Xarelto; continue Miralax while on narcotics. Please continue all other home meds as previously ordered.   DVT/PE Prophylaxis: Rivaroxaban  Disposition Planning: Home discharge     Chevis Pretty, MD

## 2016-12-12 LAB — URINE CULTURE,ROUTINE: URINE CULTURE: NO GROWTH

## 2016-12-12 NOTE — Care Management Notes (Signed)
Referral Information  ++++++ Placed Provider #1 ++++++  Case Manager: Paul McAllister  Provider Type: Home Health  Provider Name: [facility]  Address:  [address]  [place], [place] [address]  Contact:  manager  Phone: [phone] x  Fax:   Fax: [phone]

## 2016-12-17 ENCOUNTER — Ambulatory Visit: Payer: Managed Care, Other (non HMO) | Admitting: Medical

## 2016-12-17 VITALS — BP 104/62 | HR 84 | Temp 99.4°F | Ht 66.0 in | Wt 155.0 lb

## 2016-12-17 DIAGNOSIS — Z96651 Presence of right artificial knee joint: Principal | ICD-10-CM

## 2016-12-17 DIAGNOSIS — M62838 Other muscle spasm: Secondary | ICD-10-CM

## 2016-12-17 MED ORDER — DIAZEPAM 5 MG TABLET: 5 mg | Tab | Freq: Four times a day (QID) | ORAL | 0 refills | 0 days | Status: DC | PRN

## 2016-12-18 ENCOUNTER — Encounter (INDEPENDENT_AMBULATORY_CARE_PROVIDER_SITE_OTHER): Payer: Self-pay | Admitting: Family

## 2016-12-19 NOTE — Progress Notes (Signed)
[facility]  [address]  [place] [phone]  Dept: [phone]  Dept Fax: [phone]    OFFICE VISIT    PATIENT NAME:  Tracie PigeonDianne M [name]  MEDICAL RECORD NUMBER: [id number]    DICTATING PHYSICIAN:  Launa FlightJustin Brewer, PA-C  REFERRING PHYSICIAN:  Royston CowperHawkins, Erin C, [place]NP    DOB:  Jun 07, 1966  DOS:  12/17/2016    REASON FOR VISIT:  Post Operative Right TKA      -------------------HISTORY OF PRESENT ILLNESS-------------------------  Tracie Andersonianne M Kamath is a 51 y.o. female who presents with her husband for a follow-up visit one week s/p Right TKA. Her husband brought her into the clinic in a wheelchair, but soon required focused attention because of severe lightheadedness and pain (10/10). She was assisted onto the bed and, after lying supine with head and knees propped up with pillows and blanket provided, was able to provide relief. She has reported no new trauma, but has been in uncontrollable pain (8/10) since the procedure. She reports a strong sensation of pressure at inguinal ligament that travels inferiorly, difficulty with ambulation at times, intermittent periods of lightheadedness, and inability to sleep. She also reports a low grade fever and decreased appetite. She stated that this morning she has struggled more than usual because she was trying to shower and attend this appointment. She has not eaten or drank very much this morning. She is in too much pain to do physical therapy. She has used ice, Oxycodone 5mg  q4h, and Tylenol, with little to no relief. 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  She states that she does have appointment this Friday for IgG infusion.  She is also complaining of muscle spasms.     MEDICATIONS:    Outpatient Medications Prior to Visit:  Ascorbic Acid (VITAMIN C) 1,000 mg Oral Tablet Take 1,000 mg by mouth Once a day   cholecalciferol, vitamin D3, 1,000  unit Oral Tablet Take 1,000 Units by mouth Once a day Patient takes 2000 IU nightly   cyanocobalamin (VITAMIN B12) 250 mcg Oral Tablet Take 250 mcg by mouth Four times daily with food   cycloSPORINE (RESTASIS) 0.05 % Ophthalmic Dropperette Instill 1 Drop into both eyes Every 12 hours   docusate sodium (COLACE) 100 mg Oral Capsule Take 1 Cap (100 mg total) by mouth Twice per day as needed for Constipation   DULoxetine (CYMBALTA DR) 30 mg Oral Capsule, Delayed Release(E.C.) Take 60 mg by mouth Once a day   Glucosamine-Chondroit-Vit C-Mn (GLUCOSAMINE 1500 COMPLEX) 500-400 mg Oral Capsule Take by mouth   Green Tea Leaf Extract (GREEN TEA) Oral Capsule Take by mouth   hydroCHLOROthiazide (MICROZIDE) 12.5 mg Oral Capsule Take 12.5 mg by mouth Once a day   immun glob G,IgG,-gly-IgA ov50 (GAMMAGARD LIQUID) 10 % Injection Solution infusion by Intravenous route One time Mix and infuse per policy of [facility].   linaclotide (LINZESS) 145 mcg Oral Capsule Take 145 mcg by mouth Once a day   liothyronine (CYTOMEL) 25 mcg Oral Tablet Take 25 mcg by mouth Once a day   magnesium citrate (CITROMA) Oral Solution Take 296 mL by mouth One time Reported on 02/01/2016   Miscellaneous Medical Supply Misc by Does not apply route TRI Chromolean supplement   multivitamin Oral Tablet Take 1 Tab by mouth Once a day   oxyCODONE (ROXICODONE) 5 mg Oral Tablet Take 1 Tab (5 mg total) by mouth Every  4 hours as needed   Pilocarpine HCl 7.5 mg Oral Tablet Take 1 Tab (7.5 mg total) by mouth Twice daily   potassium chloride (K-DUR) 20 mEq Oral Tab Sust.Rel. Particle/Crystal Take 20 mEq by mouth Once a day   raNITIdine (ZANTAC) 300 mg Oral Tablet Take 300 mg by mouth Every evening   rivaroxaban (XARELTO) 10 mg Oral Tablet Take 1 Tab (10 mg total) by mouth Every evening with dinner   vitamin E 400 unit Oral Capsule Take 400 Units by mouth Once a day   Zinc 50 mg Tab take by mouth.      No facility-administered medications prior to visit.      ALLERGIES:  Allergy History as of 12/19/16     TETRACYCLINE       Noted Status Severity Type Reaction    01/27/08 Hickman, Herbert Seta, RN 01/27/08 Active             LEVOTHYROXINE       Noted Status Severity Type Reaction    12/04/16 1427 Katina Degree, LPN 16/10/96 Active       Comments:  Leg pain             AMOXICILLIN-POT CLAVULANATE       Noted Status Severity Type Reaction    12/04/16 1427 Katina Degree, LPN 04/54/09 Active       Comments:  Extreme constipation                  ----------------------PHYSICAL EXAMINATION--------------------------------    BP 104/62 Comment: 106/64  Pulse 84  Temp 37.4 C (99.4 F)  Ht 1.676 m (5\' 6" )  Wt 70.3 kg (155 lb)  SpO2 97%  BMI 25.02 kg/m2 Comment: at departure, patient's blood pressure reading was 110/78.    GENERAL: she is in moderate distress and fatigued, though alert, cooperative, and appears stated age.      Orthopedic Evaluation:  Patient displays inability to walk and increased difficulty going from a seated to standing position. A problem focused exam of the right knee reveals a PROM from 10 to 85 degrees. Patella tracks midline without instability. No effusion is present. Collateral ligamental testing is stable in extension, mid-flexion, and terminal flexion. Skin is clear and surgical incision well healed. Neurovascular exam is complete and intact.       -----------------------------------IMPRESSION---------------------------------------      ICD-10-CM    1. Status post total right knee replacement Z96.651    2. Muscle spasms of both lower extremities M62.838    3. Complex regional pain syndrome  G90.5    ---------------------------------------PLAN------------------------------------------------    Tracie [name] [name] was prescribed Valium to help with muscle spasms, with the hope that the decreased spasms will help alleviate the pain. Her pain medication was kept the same for now.  We have encouraged her to continue working with home health in physical  therapy.  We have encouraged her to continue with her IgG treatments.  I do feel that her increased levels of pain could be related to her small nerve fiber disease and could even be related to complex regional pain syndrome.  Her care was discussed with Dr. Toni Amend who is in agreement with our plan.    I have stressed the importance of progression of range of motion and strengthening.  Patient was reminded to follow up with their primary care provider to report any changes since their most recent surgery.  A scheduled follow up appointment has been set with me in 2 weeks. All questions answered  completely.    Her blood pressure was essentially normal at the end of our exam today 110/78.  If she continues to have hypotension and sensations of lightheadedness I have encouraged her to seek evaluation with her PCP or go to the ER.                  Note reviewed. No changes or addition.  Agree with current plan.      Nile Dear. Jaci Carrel.O.

## 2016-12-23 ENCOUNTER — Other Ambulatory Visit (HOSPITAL_BASED_OUTPATIENT_CLINIC_OR_DEPARTMENT_OTHER): Payer: Self-pay | Admitting: Adult Reconstructive Orthopaedic Surgery

## 2016-12-23 DIAGNOSIS — M25571 Pain in right ankle and joints of right foot: Secondary | ICD-10-CM

## 2016-12-24 ENCOUNTER — Ambulatory Visit
Admission: RE | Admit: 2016-12-24 | Discharge: 2016-12-24 | Disposition: A | Discharge status: Home / Self Care | Payer: Managed Care, Other (non HMO) | Source: Ambulatory Visit | Attending: Adult Reconstructive Orthopaedic Surgery | Admitting: Adult Reconstructive Orthopaedic Surgery

## 2016-12-24 ENCOUNTER — Other Ambulatory Visit (HOSPITAL_BASED_OUTPATIENT_CLINIC_OR_DEPARTMENT_OTHER): Payer: Self-pay | Admitting: Medical

## 2016-12-24 ENCOUNTER — Ambulatory Visit (INDEPENDENT_AMBULATORY_CARE_PROVIDER_SITE_OTHER): Payer: Managed Care, Other (non HMO) | Admitting: Medical

## 2016-12-24 ENCOUNTER — Ambulatory Visit (HOSPITAL_BASED_OUTPATIENT_CLINIC_OR_DEPARTMENT_OTHER)
Admission: RE | Admit: 2016-12-24 | Discharge: 2016-12-24 | Disposition: A | Discharge status: Home / Self Care | Payer: Managed Care, Other (non HMO) | Source: Ambulatory Visit | Attending: Medical | Admitting: Medical

## 2016-12-24 VITALS — Ht 67.0 in | Wt 155.0 lb

## 2016-12-24 DIAGNOSIS — M79604 Pain in right leg: Secondary | ICD-10-CM

## 2016-12-24 DIAGNOSIS — M7989 Other specified soft tissue disorders: Secondary | ICD-10-CM | POA: Insufficient documentation

## 2016-12-24 DIAGNOSIS — M7731 Calcaneal spur, right foot: Secondary | ICD-10-CM | POA: Insufficient documentation

## 2016-12-24 DIAGNOSIS — Z96651 Presence of right artificial knee joint: Secondary | ICD-10-CM | POA: Insufficient documentation

## 2016-12-24 DIAGNOSIS — M25571 Pain in right ankle and joints of right foot: Secondary | ICD-10-CM

## 2016-12-24 DIAGNOSIS — M25461 Effusion, right knee: Secondary | ICD-10-CM | POA: Insufficient documentation

## 2016-12-24 DIAGNOSIS — G9050 Complex regional pain syndrome I, unspecified: Secondary | ICD-10-CM

## 2016-12-24 DIAGNOSIS — M79661 Pain in right lower leg: Secondary | ICD-10-CM

## 2016-12-24 DIAGNOSIS — IMO0002 Reserved for concepts with insufficient information to code with codable children: Secondary | ICD-10-CM

## 2016-12-24 MED ORDER — DIAZEPAM 5 MG TABLET
5.0000 mg | ORAL_TABLET | Freq: Four times a day (QID) | ORAL | 0 refills | Status: DC | PRN
Start: 2016-12-24 — End: 2016-12-30

## 2016-12-25 ENCOUNTER — Other Ambulatory Visit (HOSPITAL_BASED_OUTPATIENT_CLINIC_OR_DEPARTMENT_OTHER): Payer: Self-pay | Admitting: Adult Reconstructive Orthopaedic Surgery

## 2016-12-25 MED ORDER — OXYCODONE 5 MG TABLET
5.0000 mg | ORAL_TABLET | ORAL | 0 refills | Status: DC | PRN
Start: 2016-12-25 — End: 2017-07-08

## 2016-12-26 DIAGNOSIS — Z96651 Presence of right artificial knee joint: Secondary | ICD-10-CM | POA: Insufficient documentation

## 2016-12-26 NOTE — Progress Notes (Signed)
PATIENT NAME: Tracie [name], Toneka M  HOSPITAL NUMBER:  [id number]  DATE OF SERVICE: 12/24/2016  DATE OF BIRTH:  October 25, 1965    CLINIC RETURN/FOLLOW UP    CHIEF COMPLAINT:  Right ankle pain, right calf pain.    PRESENT ILLNESS:  Tracie [name] is a 51 year old female who underwent right total knee replacement surgery about 2 weeks ago.  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  I saw her last week for this and we recommended Valium 5 mg.  She states that she has been taking the Valium and this seems to be helping her.  However, now she has developed some right calf pain, right ankle pain and swelling as well as ecchymosis.  She comes in today due to this concern.      Past Medical History  Current Outpatient Prescriptions   Medication Sig   . Ascorbic Acid (VITAMIN C) 1,000 mg Oral Tablet Take 1,000 mg by mouth Once a day   . cholecalciferol, vitamin D3, 1,000 unit Oral Tablet Take 1,000 Units by mouth Once a day Patient takes 2000 IU nightly   . cyanocobalamin (VITAMIN B12) 250 mcg Oral Tablet Take 250 mcg by mouth Four times daily with food   . cycloSPORINE (RESTASIS) 0.05 % Ophthalmic Dropperette Instill 1 Drop into both eyes Every 12 hours   . diazePAM (VALIUM) 5 mg Oral Tablet Take 1 Tab (5 mg total) by mouth Every 6 hours as needed for Anxiety   . docusate sodium (COLACE) 100 mg Oral Capsule Take 1 Cap (100 mg total) by mouth Twice per day as needed for Constipation   . DULoxetine (CYMBALTA DR) 30 mg Oral Capsule, Delayed Release(E.C.) Take 60 mg by mouth Once a day   . Glucosamine-Chondroit-Vit C-Mn (GLUCOSAMINE 1500 COMPLEX) 500-400 mg Oral Capsule Take by mouth   . Green Tea Leaf Extract (GREEN TEA) Oral Capsule Take by mouth   . hydroCHLOROthiazide (MICROZIDE) 12.5 mg Oral Capsule Take 12.5 mg by mouth Once a day   . immun glob G,IgG,-gly-IgA  ov50 (GAMMAGARD LIQUID) 10 % Injection Solution infusion by Intravenous route One time Mix and infuse per policy of [facility].   Marland Kitchen. linaclotide (LINZESS) 145 mcg Oral Capsule Take 145 mcg by mouth Once a day   . liothyronine (CYTOMEL) 25 mcg Oral Tablet Take 25 mcg by mouth Once a day   . magnesium citrate (CITROMA) Oral Solution Take 296 mL by mouth One time Reported on 02/01/2016   . Miscellaneous Medical Supply Misc by Does not apply route TRI Chromolean supplement   . multivitamin Oral Tablet Take 1 Tab by mouth Once a day   . oxyCODONE (ROXICODONE) 5 mg Oral Tablet Take 1 Tab (5 mg total) by mouth Every 4 hours as needed Earliest Fill Date: 12/25/16   . Pilocarpine HCl 7.5 mg Oral Tablet Take 1 Tab (7.5 mg total) by mouth Twice daily   . potassium chloride (K-DUR) 20 mEq Oral Tab Sust.Rel. Particle/Crystal Take 20 mEq by mouth Once a day   . raNITIdine (ZANTAC) 300 mg Oral Tablet Take 300 mg by mouth Every evening   . rivaroxaban (XARELTO) 10 mg Oral Tablet Take 1 Tab (10 mg total) by mouth Every evening with dinner   . vitamin E 400 unit Oral Capsule Take 400 Units by mouth Once a day   . Zinc 50 mg  Tab take by mouth.      Allergies   Allergen Reactions   . Augmentin [Amoxicillin-Pot Clavulanate]      Extreme constipation    . Synthroid [Levothyroxine]      Leg pain     . Tetracycline      Past Medical History:   Diagnosis Date   . Abnormal Pap smear 2001    LSIL   . Acute neck pain 2014     muscle relaxant med   . Arthritis    . Breast cyst     bilateral, multiple, variable sizes.    . Chest pain 01/28/2008    work-up negative   . Dyspepsia 2014    Zantac med   . Dysplasia of cervix 2001    Colpo CIN 1   . Esophageal reflux    . Hypothyroidism    . MVP (mitral valve prolapse) 12/04/2016   . Other allergic rhinitis 12/04/2016   . Psychosocial stressors 2013 - 2014    . Sjogren's disease    . Thyroid disorder    . Unspecified breast disorder 16109 - present     large breast cysts    . Valvular disease           Past Surgical History:   Procedure Laterality Date   . BIOPSY BREAST  10/12/03; 11/03/07,  04/28/2012     left breast aspirations x 2 - 2004 - 15 ml turbid and 2009 - 3 ml ; 04/28/2012 Right with rare calcifications.   . HX BREAST BIOPSY Right     needle bx.  age 64   . HX CYST INCISION AND DRAINAGE Left     aspiration   . HX DILATION AND CURETTAGE     . HX TONSILLECTOMY  1974         Family Medical History     Problem Relation (Age of Onset)    Breast Cancer Maternal Aunt    Cancer Son    Diabetes Paternal Grandmother    Healthy Mother    Heart Attack Son    Heart Disease Paternal Grandfather, Father    Hypertension Paternal Grandfather, Father    Leukemia Maternal Uncle    Other Mother, Sister    Uterine Fibroids Mother            Social History     Social History   . Marital status: Married     Spouse name: Kendell Bane   . Number of children: 3   . Years of education: 12     Occupational History   . 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   .  [employer]      sp:  Postal service spvr     Social History Main Topics   . Smoking status: Former Smoker     Packs/day: 0.25     Years: 2.00     Types: Cigarettes     Quit date: 10/22/1991   . Smokeless tobacco: Never Used   . Alcohol use No   . Drug use: No   . Sexual activity: Yes     Partners: Male     Birth control/ protection: Vasectomy     Other Topics Concern   . Abuse/Domestic Violence No   . Breast Self Exam Yes   . Caffeine Concern No   . Calcium Intake Adequate No   . Computer Use Yes   . Exercise Concern No   . Helmet Use Yes   .  Seat Belt Yes   . Special Diet Yes     low sodium diet   . Sunscreen Used No   . Uses Gait Assitive Device (Cane, Walker, Etc) No   . Right Hand Dominant Yes   . Left Hand Dominant No   . Ambidextrous No     Social History Narrative       REVIEW OF SYSTEMS:  Pertinent for right knee pain, right calf pain, right ankle pain, swelling, ecchymosis, difficulty with ambulation.    PHYSICAL EXAMINATION:  Ht 1.702 m (5\' 7" )  Wt 70.3 kg (155 lb)  BMI 24.28  kg/m2     Orthopedic exam is a problem focused orthopedic exam today of the right lower extremity.  Right knee range of motion is 0-60 degrees.  Previous incision is well healed.  She does have positive Homans sign to the right calf.  Palpable tenderness throughout the right calf and lower extremity, noted mild swelling and ecchymosis to the right lower extremity, pain along the peroneal tendons, right ankle.  She has 5/5 extensor hallucis longus and dorsiflexors.    RADIOGRAPHS:  Radiographs reviewed of the right knee shows good anatomic alignment of right total knee replacement.    ASSESSMENT:  1. Status post right total knee replacement.  2. Right calf pain.  3. Lower extremity pain.   4. Swelling of lower extremity.   5. Complex regional pain syndrome.    PLAN:  At this time I reviewed with the patient today my evaluation and recommended treatment protocol.  I am concerned for a venous blood clot.  For that reason, we will send her over to the hospital today for a stat venous duplex ultrasound of the lower extremities.  The patient has been encouraged to continue to work in physical therapy, I have shown her exercises for the ankle to continue working on ankle range of motion and strengthening.  We demonstrated stretches for her today for the calf muscle.  We will also give her a new prescription for Valium 5 mg.  I have also recommended a CPM machine for home use with home health.  The patient has a followup next week with Dr. Toni Amend.  All questions were answered today to her satisfaction.        Launa Flight, PA-C      Lovena Le, DO  Department of Orthopaedics              DD:  12/26/2016 12:24:07  DT:  12/26/2016 13:14:53 JG  D#:  [id number]      Note reviewed. No changes or addition.  Agree with current plan.      Nile Dear. Jaci Carrel.O.

## 2016-12-26 NOTE — H&P (Signed)
Dictated:  [id number]    Ht 1.702 m (5\' 7" )  Wt 70.3 kg (155 lb)  BMI 24.28 kg/m2      ICD-10-CM    1. Right calf pain M79.661 PERIPHERAL VENOUS DUPLEX - LOWER   2. Status post total right knee replacement Z96.651 PERIPHERAL VENOUS DUPLEX - LOWER     DME - CPM MACHINE   3. Lower extremity pain, right M79.604 PERIPHERAL VENOUS DUPLEX - LOWER   4. Swelling of lower extremity M79.89 PERIPHERAL VENOUS DUPLEX - LOWER   5. Complex regional pain syndrome G90.50          Orders Placed This Encounter   . CANCELED: XR KNEE RIGHT 3V   . PERIPHERAL VENOUS DUPLEX - LOWER   . diazePAM (VALIUM) 5 mg Oral Tablet   . DME - CPM MACHINE

## 2016-12-30 ENCOUNTER — Other Ambulatory Visit (HOSPITAL_BASED_OUTPATIENT_CLINIC_OR_DEPARTMENT_OTHER): Payer: Self-pay | Admitting: Adult Reconstructive Orthopaedic Surgery

## 2016-12-30 DIAGNOSIS — Z96651 Presence of right artificial knee joint: Secondary | ICD-10-CM

## 2016-12-30 MED ORDER — DIAZEPAM 5 MG TABLET
5.0000 mg | ORAL_TABLET | Freq: Four times a day (QID) | ORAL | 0 refills | Status: DC | PRN
Start: 2016-12-30 — End: 2017-07-08

## 2016-12-31 ENCOUNTER — Encounter (INDEPENDENT_AMBULATORY_CARE_PROVIDER_SITE_OTHER): Payer: Self-pay | Admitting: Family

## 2016-12-31 ENCOUNTER — Ambulatory Visit (INDEPENDENT_AMBULATORY_CARE_PROVIDER_SITE_OTHER): Payer: Managed Care, Other (non HMO) | Admitting: Family

## 2016-12-31 ENCOUNTER — Other Ambulatory Visit: Payer: Managed Care, Other (non HMO) | Attending: Family | Admitting: Family

## 2016-12-31 VITALS — BP 112/70 | HR 99 | Temp 98.6°F | Resp 16 | Ht 66.0 in | Wt 160.0 lb

## 2016-12-31 DIAGNOSIS — N390 Urinary tract infection, site not specified: Secondary | ICD-10-CM

## 2016-12-31 DIAGNOSIS — Z96659 Presence of unspecified artificial knee joint: Secondary | ICD-10-CM

## 2016-12-31 DIAGNOSIS — T8484XS Pain due to internal orthopedic prosthetic devices, implants and grafts, sequela: Secondary | ICD-10-CM

## 2016-12-31 DIAGNOSIS — G629 Polyneuropathy, unspecified: Principal | ICD-10-CM

## 2016-12-31 LAB — POCT URINE DIPSTICK
BILIRUBIN: NEGATIVE
BLOOD: NEGATIVE
GLUCOSE: NEGATIVE
GLUCOSE: NEGATIVE
LEUKOCYTES: NEGATIVE
NITRITE: NEGATIVE
PH: 6.5
PH: 6.5
PROTEIN: NEGATIVE
SPECIFIC GRAVITY: 1.005
UROBILINOGEN: 0.2

## 2016-12-31 MED ORDER — RANITIDINE 300 MG TABLET
300.0000 mg | ORAL_TABLET | Freq: Every evening | ORAL | 5 refills | Status: DC
Start: 2016-12-31 — End: 2017-02-05

## 2016-12-31 NOTE — Progress Notes (Signed)
Chart, Medications, PMH, [facility] as documented in the chart were reviewed with the patient.     Tracie [name] is a 51 y.o. female who presents today for Follow-up.    Chief Complaint   Patient presents with   . Neuropathy     12/10/16 had Right knee replacement. PT at home 3 x a week. CMP Machine ordered and to be in tomorrow. Swelling and bruising getting better. Had been using compression stockings during the day and night as instructed, however, felt like making pain worse. Took stockings off last night and had the best night sleep since being home. has seen dr. Cherene Julian with the neuropathy pain, has put her on valium and trileptal, thinks its helping the pain some. still on the percocet for pain. has elevated and iced all   . Urinary Tract Infection     no further fevers after starting abx, finished abx couple weeks ago, wanted urine rechecked.     Patient Active Problem List    Diagnosis Date Noted   . Status post total right knee replacement 12/26/2016   . Hip bursitis 12/04/2016   . Vitamin D deficiency 12/04/2016   . Acquired hypothyroidism 12/04/2016   . 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 Chronic constipation 12/04/2016   . Small fiber neuropathy (HCC) 12/04/2016   . Other allergic rhinitis 12/04/2016   . MVP (mitral valve prolapse) 12/04/2016   . Routine health maintenance 12/04/2016   . Primary immunodeficiency disorder (HCC) 02/01/2016   . Primary osteoarthritis involving multiple joints 02/01/2016   . Fibromyalgia syndrome 02/01/2016   . Psychosocial stressors 11/17/2012   . Dyspepsia 11/17/2012   . Breast cyst 09/11/2011   . Sjogren's syndrome (HCC) 01/28/2008   . Hyperlipidemia, acquired 01/28/2008   . Atrial paroxysmal tachycardia (HCC) 01/28/2008     Past Surgical History:   Procedure Laterality Date   . BIOPSY BREAST  10/12/03; 11/03/07,  04/28/2012     left breast aspirations x 2 - 2004 - 15 ml turbid and 2009 - 3 ml ; 04/28/2012 Right with rare calcifications.   . HX BREAST BIOPSY Right     needle bx.   age 10   . HX CYST INCISION AND DRAINAGE Left     aspiration   . HX DILATION AND CURETTAGE     . HX TONSILLECTOMY  1974   . REPLACEMENT TOTAL KNEE Right 12/10/2016         Family Medical History     Problem Relation (Age of Onset)    Breast Cancer Maternal Aunt    Cancer Son    Diabetes Paternal Grandmother    Healthy Mother    Heart Attack Son    Heart Disease Paternal Grandfather, Father    Hypertension Paternal Grandfather, Father    Leukemia Maternal Uncle    Other Mother, Sister    Uterine Fibroids Mother            Social History     Social History   . Marital status: Married     Spouse name: Kendell Bane   . Number of children: 3   . Years of education: 12     Occupational History   . 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   .  [employer]      sp:  Postal service spvr     Social History Main Topics   . Smoking status: Former Smoker     Packs/day: 0.25  Years: 2.00     Types: Cigarettes     Quit date: 10/22/1991   . Smokeless tobacco: Never Used   . Alcohol use No   . Drug use: No   . Sexual activity: Yes     Partners: Male     Birth control/ protection: Vasectomy     Other Topics Concern   . Abuse/Domestic Violence No   . Breast Self Exam Yes   . Caffeine Concern No   . Calcium Intake Adequate No   . Computer Use Yes   . Exercise Concern No   . Helmet Use Yes   . Seat Belt Yes   . Special Diet Yes     low sodium diet   . Sunscreen Used No   . Uses Gait Assitive Device (Cane, Walker, Etc) No   . Right Hand Dominant Yes   . Left Hand Dominant No   . Ambidextrous No     Social History Narrative        Allergies: Augmentin [amoxicillin-pot clavulanate]; Synthroid [levothyroxine]; and Tetracycline     Current Outpatient Prescriptions   Medication Sig   . Ascorbic Acid (VITAMIN C) 1,000 mg Oral Tablet Take 1,000 mg by mouth Once a day   . cholecalciferol, vitamin D3, 1,000 unit Oral Tablet Take 1,000 Units by mouth Once a day Patient takes 2000 IU nightly   . cyanocobalamin (VITAMIN B12) 250 mcg Oral Tablet Take 250 mcg by  mouth Four times daily with food   . cycloSPORINE (RESTASIS) 0.05 % Ophthalmic Dropperette Instill 1 Drop into both eyes Every 12 hours   . diazePAM (VALIUM) 5 mg Oral Tablet Take 1 Tab (5 mg total) by mouth Every 6 hours as needed for Anxiety   . DULoxetine (CYMBALTA DR) 30 mg Oral Capsule, Delayed Release(E.C.) Take 60 mg by mouth Once a day   . Glucosamine-Chondroit-Vit C-Mn (GLUCOSAMINE 1500 COMPLEX) 500-400 mg Oral Capsule Take by mouth   . Green Tea Leaf Extract (GREEN TEA) Oral Capsule Take by mouth   . hydroCHLOROthiazide (MICROZIDE) 12.5 mg Oral Capsule Take 12.5 mg by mouth Once a day   . immun glob G,IgG,-gly-IgA ov50 (GAMMAGARD LIQUID) 10 % Injection Solution infusion by Intravenous route One time Mix and infuse per policy of [facility].   Marland Kitchen linaclotide (LINZESS) 145 mcg Oral Capsule Take 145 mcg by mouth Once a day   . liothyronine (CYTOMEL) 25 mcg Oral Tablet Take 25 mcg by mouth Once a day   . multivitamin Oral Tablet Take 1 Tab by mouth Once a day   . OXcarbazepine (TRILEPTAL) 150 mg Oral Tablet Take 150 mg by mouth Twice daily   . oxyCODONE (ROXICODONE) 5 mg Oral Tablet Take 1 Tab (5 mg total) by mouth Every 4 hours as needed Earliest Fill Date: 12/25/16   . Pilocarpine HCl 7.5 mg Oral Tablet Take 1 Tab (7.5 mg total) by mouth Twice daily   . potassium chloride (K-DUR) 20 mEq Oral Tab Sust.Rel. Particle/Crystal Take 20 mEq by mouth Once a day   . raNITIdine (ZANTAC) 300 mg Oral Tablet Take 1 Tab (300 mg total) by mouth Every evening   . verapamil (CALAN) 40 mg Oral Tablet Take 40 mg by mouth One at night   . Zinc 50 mg Tab take by mouth.        Wt Readings from Last 3 Encounters:   12/31/16 72.6 kg (160 lb)   12/24/16 70.3 kg (155 lb)   12/17/16 70.3 kg (155  lb)       ROS:  Constitutional: Denies fevers, chills, sweats, fatigue and anorexia  SKIN: Denies rashes, changes in hair, skin or nails  Eyes: Denies visual disturbance, eye irritation or redness  HEENT: Denies Rhinorrhea, ear pain,  tinnitus, oral lesions or sore throat.  NECK: Denies neck masses, pain or stiffness  Respiratory: Denies cough, wheezing or dyspnea  Cardiology: Denies chest pain, heart palpitations, or  lower extremity edema  GI: Denies reflux symptoms, nausea, vomiting, change in bowel habits, diarrhea, constipation or abdominal pain  GU: Denies urinary frequency, dysuria, nocturia, hesitancy  Hematologic: Denies bruising, excessive bleeding and lymphadenopathy  Musculoskeletal: Denies myalgias, stiff joints, back pain, muscle weakness and joint pain  Neuro: Denies headaches, dizziness, vertigo, paresthesia and weakness  Psychiatric: Denies anxiety, depression, mood swings, suicidal/homicidal ideation and sleep disturbance  ENDO: Denies change in appetite or weight        Physical Exam  Vitals: BP 112/70  Pulse 99  Temp 37 C (98.6 F) (Tympanic)   Resp 16  Ht 1.676 m (5\' 6" )  Wt 72.6 kg (160 lb)  SpO2 99%  BMI 25.82 kg/m2 Body mass index is 25.82 kg/(m^2).  PHYSICAL EXAM  ( ? = WNL;  X = Abnormal; D = Deferred)   AREA Element of Exam WNL Abnormal Findings   GEN Alert, Well-Developed, Well Nourished, No Abuse detected, No apparent distress  ?     Vital Signs Stable ?    EYE PERRLA, EOMI ?     Sclera and Conjunctiva Clear ?    ENT TM Bil. Intact, pearly grey, hearing grossly intact ?     Nasal turbinate pink, moist bil., no inflammation ?     Oropharnyx moist, no exudates, no erythema ?    NECK Supple, No Mass, No lymphadenopathy, no JVD ?     Trachea midline, No Thyromegaly ?    RESP Symmetrical, No Accessory Muscle Use ?     Clear, Breath sounds equal bil ?    CV HRRR, no murmurs, rubs, gallops ?     No Pretibial Edema ?     Bil. Equal Palpable Radial, Pedal, Postib Pulses ?    ABD Soft, nontender, good BS, No Mass ?     No organomegaly, guarding or rebound  ?    MUSC   Gait Steady, Good ROM without assistive device ?     Symmetrical strength all extremities ?    NEURO Digits and nails without tenderness or abnormality ?      Cranial Nerves II - XII grossly intact ?     Normal Sensation ?    SKIN No rash, no erythema, no lesions ?     Warm, dry, normal turgor ?    PSYCH Alert, oriented to time, person, place ?     Normal mood & affect ?          ASSESSMENT AND PLAN:      ICD-10-CM    1. Small fiber neuropathy (HCC)  F/u with dr. Cooper Renderalqueda as scheduled G62.9    2. UTI (urinary tract infection)  Recheck today N39.0 URINE CULTURE     URINE CULTURE     POCT Urine dipstick   3. Painful total knee replacement, sequela  Discussed recovery. Enc. Pt to do outpt PT, needs to do pain meds 1 hour prior to PT T84.84XS     Z96.659        Counseled RE:  Medications, diagnosis, treatment plan, diet and exercise.  Time in care: 40  Counsel > 50% yes  Follow Up: 3 months and prn    Royston Cowper, APRN    [facility]  [facility]  [address]  [place] [address]  [phone]  12/31/2016

## 2017-01-03 ENCOUNTER — Telehealth (INDEPENDENT_AMBULATORY_CARE_PROVIDER_SITE_OTHER): Payer: Self-pay | Admitting: Family

## 2017-01-03 ENCOUNTER — Ambulatory Visit (INDEPENDENT_AMBULATORY_CARE_PROVIDER_SITE_OTHER): Payer: Managed Care, Other (non HMO) | Admitting: Adult Reconstructive Orthopaedic Surgery

## 2017-01-03 ENCOUNTER — Ambulatory Visit
Admission: RE | Admit: 2017-01-03 | Discharge: 2017-01-03 | Disposition: A | Discharge status: Home / Self Care | Payer: Managed Care, Other (non HMO) | Source: Ambulatory Visit | Attending: Adult Reconstructive Orthopaedic Surgery | Admitting: Adult Reconstructive Orthopaedic Surgery

## 2017-01-03 ENCOUNTER — Encounter (HOSPITAL_BASED_OUTPATIENT_CLINIC_OR_DEPARTMENT_OTHER): Payer: Self-pay | Admitting: Adult Reconstructive Orthopaedic Surgery

## 2017-01-03 VITALS — BP 112/70 | Ht 67.0 in | Wt 160.0 lb

## 2017-01-03 DIAGNOSIS — Z96651 Presence of right artificial knee joint: Principal | ICD-10-CM

## 2017-01-03 DIAGNOSIS — Z471 Aftercare following joint replacement surgery: Secondary | ICD-10-CM | POA: Insufficient documentation

## 2017-01-03 LAB — URINE CULTURE: URINE CULTURE: NO GROWTH

## 2017-01-03 MED ORDER — HYDROCODONE 7.5 MG-ACETAMINOPHEN 325 MG TABLET: 1 | Tab | Freq: Four times a day (QID) | ORAL | 0 refills | 0 days | Status: DC | PRN

## 2017-01-03 NOTE — Progress Notes (Signed)
[facility]  [address]  Tracie [name] [phone]  Dept: [phone]  Dept Fax: [phone]    OFFICE VISIT    PATIENT NAME:  Tracie PigeonDianne M [name]  MEDICAL RECORD NUMBER: [id number]    DICTATING PHYSICIAN:  Lovena Lehristopher Makaylie [name], DO  REFERRING PHYSICIAN:  Royston CowperHawkins, Erin C, WisconsinCFNP    DOB:  02/12/1966  DOS:  01/03/2017    REASON FOR VISIT:  Post Operative Right TKA      -------------------HISTORY OF PRESENT ILLNESS-------------------------  Tracie [name] is a 51 y.o. female who presents for a routine 3 week post operative visit from right TKA.  Tracie AndersonDianne M Shook has no reported new trauma.  No reported fevers or chills. DVT prophylaxis has been completed.  Outpatient physical therapy has not been initiated.      MEDICATIONS:    Outpatient Medications Prior to Visit:  Ascorbic Acid (VITAMIN C) 1,000 mg Oral Tablet Take 1,000 mg by mouth Once a day   cholecalciferol, vitamin D3, 1,000 unit Oral Tablet Take 1,000 Units by mouth Once a day Patient takes 2000 IU nightly   cyanocobalamin (VITAMIN B12) 250 mcg Oral Tablet Take 250 mcg by mouth Four times daily with food   cycloSPORINE (RESTASIS) 0.05 % Ophthalmic Dropperette Instill 1 Drop into both eyes Every 12 hours   diazePAM (VALIUM) 5 mg Oral Tablet Take 1 Tab (5 mg total) by mouth Every 6 hours as needed for Anxiety   DULoxetine (CYMBALTA DR) 30 mg Oral Capsule, Delayed Release(E.C.) Take 60 mg by mouth Once a day   Glucosamine-Chondroit-Vit C-Mn (GLUCOSAMINE 1500 COMPLEX) 500-400 mg Oral Capsule Take by mouth   Green Tea Leaf Extract (GREEN TEA) Oral Capsule Take by mouth   hydroCHLOROthiazide (MICROZIDE) 12.5 mg Oral Capsule Take 12.5 mg by mouth Once a day   immun glob G,IgG,-gly-IgA ov50 (GAMMAGARD LIQUID) 10 % Injection Solution infusion by Intravenous route One time Mix and infuse per policy of [facility].   linaclotide (LINZESS) 145 mcg Oral Capsule Take 145 mcg by mouth Once a day   liothyronine (CYTOMEL) 25 mcg Oral Tablet  Take 25 mcg by mouth Once a day   multivitamin Oral Tablet Take 1 Tab by mouth Once a day   OXcarbazepine (TRILEPTAL) 150 mg Oral Tablet Take 150 mg by mouth Twice daily   oxyCODONE (ROXICODONE) 5 mg Oral Tablet Take 1 Tab (5 mg total) by mouth Every 4 hours as needed Earliest Fill Date: 12/25/16   Pilocarpine HCl 7.5 mg Oral Tablet Take 1 Tab (7.5 mg total) by mouth Twice daily   potassium chloride (K-DUR) 20 mEq Oral Tab Sust.Rel. Particle/Crystal Take 20 mEq by mouth Once a day   raNITIdine (ZANTAC) 300 mg Oral Tablet Take 1 Tab (300 mg total) by mouth Every evening   verapamil (CALAN) 40 mg Oral Tablet Take 40 mg by mouth One at night   Zinc 50 mg Tab take by mouth.      No facility-administered medications prior to visit.     ALLERGIES:  Allergy History as of 01/03/17     TETRACYCLINE       Noted Status Severity Type Reaction    01/27/08 Janeann MerlHickman, Heather, RN 01/27/08 Active             LEVOTHYROXINE       Noted Status Severity Type Reaction    12/04/16 1427 Katina DegreeHarris, Tracie [name] 95/63/8702/14/18 Active       Comments:  Leg pain  AMOXICILLIN-POT CLAVULANATE       Noted Status Severity Type Reaction    12/04/16 1427 Katina Degree, [name] 16/10/96 Active       Comments:  Extreme constipation                  ----------------------PHYSICAL EXAMINATION--------------------------------    BP 112/70  Ht 1.702 m (5\' 7" )  Wt 72.6 kg (160 lb)  BMI 25.06 kg/m2    GENERAL: she is alert, cooperative, no distress, appears stated age.      Orthopedic Evaluation:  Patient displays an assisted gait and is able to go from a seated to standing position without difficulty. A problem focused exam of the right knee reveals a PROM from 5 to 90. Patella tracks midline without instability.   No effusion is present. Collateral ligamental testing is stable in extension, mid-flexion, and terminal flexion.Skin is clear and surgical incision well healed.    Neurovascular exam is complete and intact.     Radiographs:  Right knee standard  weight bearing xrays reveal a tricompartmental total knee implant in anatomic position without evidence of failure or loss of fixation.  Patella is midline.        -----------------------------------IMPRESSION---------------------------------------      ICD-10-CM    1. Status post total right knee replacement Z96.651        ---------------------------------------PLAN------------------------------------------------  Orders Placed This Encounter   . Refer to External Physical Therapy   . HYDROcodone-acetaminophen (NORCO) 7.5-325 mg Oral Tablet       At this time I have reminded Iliana M Khim of antibiotic prophylaxis in light of minor procedures.  I have stressed the importance of progression of range of motion and strengthening with the outpatient physical therapy. Pain medications have been reviewed and new perscription reissued.  Patient was reminded to follow up with their primary care provider to report any changes since their most recent surgery.  A scheduled follow up appointment has been set with me in 2 months. All questions answered completely.

## 2017-01-03 NOTE — Telephone Encounter (Signed)
Patient notified of urine culture results. Gwenyth OberJessica A Refugia Laneve, RN  01/03/2017, 09:07

## 2017-01-03 NOTE — Telephone Encounter (Signed)
-----   Message from Royston CowperErin C Hawkins, Amg Specialty Hospital-WichitaCFNP sent at 01/03/2017  7:56 AM EDT -----  No UTI  Thanks, erin

## 2017-01-08 ENCOUNTER — Other Ambulatory Visit (INDEPENDENT_AMBULATORY_CARE_PROVIDER_SITE_OTHER): Payer: Self-pay | Admitting: Family

## 2017-01-08 MED ORDER — NITROFURANTOIN MONOHYDRATE/MACROCRYSTALS 100 MG CAPSULE: 100 mg | Cap | Freq: Two times a day (BID) | ORAL | 0 refills | 0 days | Status: DC

## 2017-01-08 NOTE — Telephone Encounter (Signed)
Pt called in today, had knee surgery on 2/20,  Was into see us last week and thought she had UTI, was given Cipro for 7 days, things got better, pt was called and told Urine Culture was normal, now that's shes off the anbx is having the burning and frequency, problem is she can not get in here to do sample because her husband has to drive her?  She is wanting to know if something else can be phoned in or what you suggest? Pt states symptoms where better after she was on ambx.. Uses [facility] EP JLS

## 2017-01-08 NOTE — Telephone Encounter (Signed)
Discussed with pt and done JLS

## 2017-01-08 NOTE — Telephone Encounter (Signed)
Can try macrobid 100 BID X 7 days  Thanks, Smita Lesh

## 2017-01-08 NOTE — Telephone Encounter (Signed)
Discussed with pt and done JLS  [name], MA  01/08/2017, 10:37

## 2017-01-10 ENCOUNTER — Telehealth (INDEPENDENT_AMBULATORY_CARE_PROVIDER_SITE_OTHER): Payer: Self-pay | Admitting: Family

## 2017-01-10 NOTE — Telephone Encounter (Signed)
ALLERGY LIST UPDATED  CALLED IN RX  D/C WITH PT  Adrian BlackwaterElizabeth Geralyn Figiel, KentuckyMA  01/10/2017, 15:14

## 2017-01-10 NOTE — Telephone Encounter (Signed)
Stop macrobid, add to allergy list.  Start bactrim ds 1 BID 7 days  Thanks, Jeffry Vogelsang

## 2017-01-10 NOTE — Telephone Encounter (Signed)
Recently prescribed macrobid for urinary issues, was supposed to come see you this past week but was to sick with this urinary thing?  Every time she takes this macrobid gets very nauseated and terrible ha. Just feeling terrible. Not sure what she needs, appt, labs, or just a new antibiotic until she feels well enough to get in here  Adrian BlackwaterElizabeth Zakari Couchman, [place]  01/10/2017, 08:09

## 2017-01-11 ENCOUNTER — Other Ambulatory Visit (INDEPENDENT_AMBULATORY_CARE_PROVIDER_SITE_OTHER): Payer: Self-pay

## 2017-01-16 ENCOUNTER — Ambulatory Visit: Payer: Managed Care, Other (non HMO)

## 2017-01-16 DIAGNOSIS — D838 Other common variable immunodeficiencies: Secondary | ICD-10-CM

## 2017-01-16 LAB — CBC WITH DIFF
BASOPHIL #: 0.1 x10ˆ3/uL (ref 0.00–0.20)
BASOPHIL %: 1 %
EOSINOPHIL #: 0.1 x10ˆ3/uL (ref 0.00–0.50)
EOSINOPHIL %: 1 %
HCT: 37.3 % (ref 34.6–46.2)
HGB: 12.5 g/dL (ref 11.8–15.8)
LYMPHOCYTE #: 1 x10ˆ3/uL (ref 0.90–3.40)
LYMPHOCYTE %: 25 %
MCH: 28.3 pg (ref 27.6–33.2)
MCHC: 33.6 g/dL (ref 32.6–35.4)
MCV: 84.4 fL (ref 82.3–96.7)
MONOCYTE #: 0.4 x10ˆ3/uL (ref 0.20–0.90)
MONOCYTE %: 9 %
MPV: 9.8 fL (ref 6.6–10.2)
NEUTROPHIL #: 2.6 x10ˆ3/uL (ref 1.50–6.40)
NEUTROPHIL %: 64 %
PLATELETS: 189 x10ˆ3/uL (ref 140–440)
RBC: 4.43 x10?6/uL (ref 3.80–5.24)
RDW: 15.2 % (ref 12.4–15.2)
WBC: 4.1 x10ˆ3/uL (ref 3.5–10.3)

## 2017-01-16 LAB — VITAMIN D: VITAMIN D: 33 ng/mL (ref 30–100)

## 2017-01-16 LAB — IMMUNOGLOBULIN A (IGA), SERUM: IMMUNOGLOBULIN A (IGA): 98 mg/dL (ref 40–350)

## 2017-01-16 LAB — IMMUNOGLOBULIN M (IGM), SERUM: IMMUNOGLOBULIN M (IGM): 132 mg/dL (ref 50–300)

## 2017-01-16 LAB — IMMUNOGLOBULIN G (IGG), SERUM: IMMUNOGLOBULIN G (IGG): 1842 mg/dL — ABNORMAL HIGH (ref 650–1600)

## 2017-01-17 LAB — MAYO MISC TEST - AMB

## 2017-01-20 LAB — IMMUNOGLOBULIN SUBCLASS IGG4: IMMUNOGLOBULIN SUBCLASS IGG4: 45 mg/dL (ref 2.4–121.0)

## 2017-02-04 ENCOUNTER — Other Ambulatory Visit (INDEPENDENT_AMBULATORY_CARE_PROVIDER_SITE_OTHER): Payer: Self-pay | Admitting: Family

## 2017-02-04 MED ORDER — AMILORIDE 5 MG-HYDROCHLOROTHIAZIDE 50 MG TABLET
1.0000 | ORAL_TABLET | Freq: Every day | ORAL | 1 refills | Status: DC
Start: 2017-02-04 — End: 2017-08-03

## 2017-02-04 NOTE — Telephone Encounter (Signed)
Patient is ok with starting back the amiloride 5/50 daily, and will stop regular HCTZ. Gwenyth Ober, RN  02/04/2017, 14:22

## 2017-02-04 NOTE — Telephone Encounter (Signed)
As long as her BP is ok, can change back to the amiloride-HCTZ 5/50 daily #90, 1 rf.  D/c HCTZ off list is she wants to change  Thanks,  Royston Cowper, CFNP

## 2017-02-04 NOTE — Telephone Encounter (Signed)
Pt called in today, having issues still with swelling in stomach and tops of legs,  She is wanting to know if her HCTZ 12.5mg  can be changed to something different?   JLS  Uses [facility] JLS

## 2017-02-05 ENCOUNTER — Other Ambulatory Visit (INDEPENDENT_AMBULATORY_CARE_PROVIDER_SITE_OTHER): Payer: Self-pay | Admitting: Family Medicine

## 2017-02-05 MED ORDER — RANITIDINE 300 MG TABLET
300.0000 mg | ORAL_TABLET | Freq: Every evening | ORAL | 2 refills | Status: DC
Start: 2017-02-05 — End: 2021-08-14

## 2017-02-05 MED ORDER — IBUPROFEN 600 MG TABLET
600.00 mg | ORAL_TABLET | Freq: Three times a day (TID) | ORAL | 2 refills | Status: AC | PRN
Start: 2017-02-05 — End: 2017-05-06

## 2017-02-13 ENCOUNTER — Telehealth (INDEPENDENT_AMBULATORY_CARE_PROVIDER_SITE_OTHER): Payer: Self-pay | Admitting: Family

## 2017-02-13 NOTE — Telephone Encounter (Signed)
Could she come and give a urine sample for a UA/Cx  Thanks,  Royston Cowper, CFNP

## 2017-02-13 NOTE — Telephone Encounter (Signed)
Pt thinks she has another UTI, has therapy today till 28, could you see her after then or does she need to go to urgent care to do a urine

## 2017-03-04 ENCOUNTER — Ambulatory Visit: Payer: Managed Care, Other (non HMO) | Admitting: Medical

## 2017-03-04 VITALS — Ht 67.0 in | Wt 158.0 lb

## 2017-03-04 DIAGNOSIS — Z96651 Presence of right artificial knee joint: Secondary | ICD-10-CM

## 2017-03-04 NOTE — Progress Notes (Signed)
[facility]  [address]  [place] [address]  Dept: [phone]  Dept Fax: [phone]    OFFICE VISIT    PATIENT NAME:  Tracie [name]  MEDICAL RECORD NUMBER: [id number]    DICTATING PHYSICIAN:  Launa Flight, PA-C  REFERRING PHYSICIAN:  Royston Cowper, [place]    DOB:  1965/11/15  DOS:  03/04/2017    CHIEF COMPLAINT: 3 month TKA follow-up.      ------------------------------HISTORY OF PRESENT ILLNESS----------------------------------  Tracie [name] is a 51 y.o. female who presents for a routine 3 month post operative visit from right TKA.  Tracie [name] has no reported new trauma.  No reported fevers or chills. No reported feelings of instability.  she has completed outpatient physical therapy. No Medication changes to report.   She states that she has been having issues with neuropathy which is now being treated by a local neurologist.      ----------------------------------PAST MEDICAL HISTORY------------------------------------------  Past Medical History:   Diagnosis Date   . Abnormal Pap smear 2001    LSIL   . Acute neck pain 2014     muscle relaxant med   . Arthritis    . Breast cyst     bilateral, multiple, variable sizes.    . Chest pain 01/28/2008    work-up negative   . Dyspepsia 2014    Zantac med   . Dysplasia of cervix 2001    Colpo CIN 1   . Esophageal reflux    . Hypothyroidism    . MVP (mitral valve prolapse) 12/04/2016   . Other allergic rhinitis 12/04/2016   . Psychosocial stressors 2013 - 2014    . Sjogren's disease    . Thyroid disorder    . Unspecified breast disorder 40102 - present     large breast cysts    . Valvular disease            ---------------------------------PAST SURGICAL HISTORY------------------------------------------  Past Surgical History:   Procedure Laterality Date   . BIOPSY BREAST  10/12/03; 11/03/07,  04/28/2012     left breast aspirations x 2 - 2004 - 15 ml turbid and 2009 - 3 ml ; 04/28/2012 Right with rare calcifications.   . HX BREAST  BIOPSY Right     needle bx.  age 70   . HX CYST INCISION AND DRAINAGE Left     aspiration   . HX DILATION AND CURETTAGE     . HX TONSILLECTOMY  1974   . REPLACEMENT TOTAL KNEE Right 12/10/2016           -----------------------------------------MEDICATIONS---------------------------------------------------    Outpatient Medications Prior to Visit:  aMILoride-hydroCHLOROthiazide (MODURETIC) 5-50 mg Oral Tablet Take 1 Tab by mouth Once a day   Ascorbic Acid (VITAMIN C) 1,000 mg Oral Tablet Take 1,000 mg by mouth Once a day   cholecalciferol, vitamin D3, 1,000 unit Oral Tablet Take 1,000 Units by mouth Once a day Patient takes 2000 IU nightly   cyanocobalamin (VITAMIN B12) 250 mcg Oral Tablet Take 250 mcg by mouth Four times daily with food   cycloSPORINE (RESTASIS) 0.05 % Ophthalmic Dropperette Instill 1 Drop into both eyes Every 12 hours   diazePAM (VALIUM) 5 mg Oral Tablet Take 1 Tab (5 mg total) by mouth Every 6 hours as needed for Anxiety   DULoxetine (CYMBALTA DR) 30 mg Oral Capsule, Delayed Release(E.C.) Take 60 mg by mouth Once a day   Glucosamine-Chondroit-Vit C-Mn (GLUCOSAMINE 1500 COMPLEX) 500-400 mg Oral Capsule  Take by mouth   Green Tea Leaf Extract (GREEN TEA) Oral Capsule Take by mouth   HYDROcodone-acetaminophen (NORCO) 7.5-325 mg Oral Tablet Take 1 Tab by mouth Every 6 hours as needed for Pain   Ibuprofen (MOTRIN) 600 mg Oral Tablet Take 1 Tab (600 mg total) by mouth Three times a day as needed for Pain for up to 90 days   immun glob G,IgG,-gly-IgA ov50 (GAMMAGARD LIQUID) 10 % Injection Solution infusion by Intravenous route One time Mix and infuse per policy of [facility].   linaclotide (LINZESS) 145 mcg Oral Capsule Take 145 mcg by mouth Once a day   liothyronine (CYTOMEL) 25 mcg Oral Tablet Take 25 mcg by mouth Once a day   multivitamin Oral Tablet Take 1 Tab by mouth Once a day   OXcarbazepine (TRILEPTAL) 150 mg Oral Tablet Take 150 mg by mouth Twice daily   oxyCODONE (ROXICODONE) 5 mg  Oral Tablet Take 1 Tab (5 mg total) by mouth Every 4 hours as needed Earliest Fill Date: 12/25/16   pilocarpine (SALAGEN) 7.5 mg Oral Tablet TAKE 1 TABLET THREE TIMES A DAY   potassium chloride (K-DUR) 20 mEq Oral Tab Sust.Rel. Particle/Crystal Take 20 mEq by mouth Once a day   raNITIdine (ZANTAC) 300 mg Oral Tablet Take 1 Tab (300 mg total) by mouth Every evening   verapamil (CALAN) 40 mg Oral Tablet Take 40 mg by mouth One at night   Zinc 50 mg Tab take by mouth.      No facility-administered medications prior to visit.     -------------------------------------------ALLERGIES------------------------------------------------------  Allergy History as of 03/04/17     TETRACYCLINE       Noted Status Severity Type Reaction    01/27/08 Janeann MerlHickman, Heather, RN 01/27/08 Active             LEVOTHYROXINE       Noted Status Severity Type Reaction    12/04/16 1427 Katina DegreeHarris, Brianna, LPN 19/14/7802/14/18 Active       Comments:  Leg pain             AMOXICILLIN-POT CLAVULANATE       Noted Status Severity Type Reaction    12/04/16 1427 Katina DegreeHarris, Brianna, LPN 29/56/2102/14/18 Active       Comments:  Extreme constipation            NITROFURANTOIN MONOHYD/M-CRYST       Noted Status Severity Type Reaction    01/10/17 1512 Adrian BlackwaterByrd, Elizabeth, [place] 01/10/17 Active                   -----------------------------------------FAMILY HISTORY----------------------------------------------  Family Medical History     Problem Relation (Age of Onset)    Breast Cancer Maternal Aunt    Cancer Son    Diabetes Paternal Grandmother    Healthy Mother    Heart Attack Son    Heart Disease Paternal Grandfather, Father    Hypertension Paternal Grandfather, Father    Leukemia Maternal Uncle    Other Mother, Sister    Uterine Fibroids Mother              ---------------------------------------SOCIAL HISTORY-------------------------------------------------  Social History     Social History   . Marital status: Married     Spouse name: Kendell Baneroy   . Number of children: 3   . Years of  education: 12     Occupational History   . 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   .  [employer]  sp:  Postal service spvr     Social History Main Topics   . Smoking status: Former Smoker     Packs/day: 0.25     Years: 2.00     Types: Cigarettes     Quit date: 10/22/1991   . Smokeless tobacco: Never Used   . Alcohol use No   . Drug use: No   . Sexual activity: Yes     Partners: Male     Birth control/ protection: Vasectomy     Other Topics Concern   . Abuse/Domestic Violence No   . Breast Self Exam Yes   . Caffeine Concern No   . Calcium Intake Adequate No   . Computer Use Yes   . Exercise Concern No   . Helmet Use Yes   . Seat Belt Yes   . Special Diet Yes     low sodium diet   . Sunscreen Used No   . Uses Gait Assitive Device (Cane, Walker, Etc) No   . Right Hand Dominant Yes   . Left Hand Dominant No   . Ambidextrous No     Social History Narrative       -------------------------------------REVIEW OF SYSTEMS---------------------------------------------  Constitutional: negative  All other ROS Negative    ------------------PHYSICAL EXAMINATION--------------------------    Ht 1.702 m (5\' 7" )  Wt 71.7 kg (158 lb)  BMI 24.75 kg/m2    GENERAL: she is alert, cooperative, no distress, appears stated age.      Orthopedic Evaluation:    Patient displays a equal and symmetric unassisted gait.    Right hip shows a full ROM, (Forward flexion 0-115 degrees, internal rotation 0-35 degrees, external rotation 0-45 degrees). FABRE testing negative. No tenderness to palpation over the greater trochanter.     Left hip shows a full ROM, (Forward flexion 0-115 degrees, internal rotation 0-35 degrees, external rotation 0-45 degrees). FABRE testing negative. No tenderness to palpation over the greater trochanter.    Left knee ROM 0 to 125 degrees.  Patella tracks midline without instability. Clark's patella compression test does not reveal crepitus or pain. No tenderness to palpation of the distal femur and proximal tibia. No effusion  is present.  Ligamental testing is stable. McMurrary testing is negative.  No Baker's cyst is present.  Skin is clear.   Right knee alignment is slight valgus.  Incision well healed. Skin clear. AROM initiated from 0 to greater than 115 degrees. Colaterall ligament testing is stable.  Patella is tracking midline without evidence of instability.  No effusion.   Right Ankle shows a full functional range of motion.   No instability of deformity noted.Skin is clear.   Left Ankle shows a full functional range of motion.   No instability of deformity noted.Skin is clear.   Neurovascular exam completed on both extremities shows palpable dorsalis pedis pulses with brisk cappilary refill noted. Deep tendon reflexes are present.  Overall muscle definition is symmetric without focal neurologic deficiency.    Skin inspection is clear.          -------------------------------------------------IMAGING----------------------------------------------  None  --------------------------------IMPRESSION-------------------------------    ICD-10-CM    1. s\p right TKA 12/10/2016 Z96.651          ----------------------------TREATMENT PLAN--------------------------    At this time I have reminded Tracie [name] of antibiotic prophylaxis in light of minor procedures.  We provided an educational information to reinforce this element.  I have stressed the importance of life long knee strengthening and ROM exercises. A scheduled follow up  appointment has been set with x-rays in 3 months.  If any questions or concerns arise patient was informed to notify the office for a sooner follow up. All questions answered.               Note reviewed. No changes or addition.  Agree with current plan.      Nile Dear. Jaci Carrel.O.

## 2017-03-31 ENCOUNTER — Other Ambulatory Visit (HOSPITAL_BASED_OUTPATIENT_CLINIC_OR_DEPARTMENT_OTHER): Payer: Self-pay | Admitting: Medical

## 2017-03-31 DIAGNOSIS — Z96651 Presence of right artificial knee joint: Secondary | ICD-10-CM

## 2017-03-31 DIAGNOSIS — M25561 Pain in right knee: Secondary | ICD-10-CM

## 2017-04-03 ENCOUNTER — Encounter (INDEPENDENT_AMBULATORY_CARE_PROVIDER_SITE_OTHER): Payer: Managed Care, Other (non HMO) | Admitting: Family

## 2017-04-11 ENCOUNTER — Ambulatory Visit
Admission: RE | Admit: 2017-04-11 | Discharge: 2017-04-11 | Disposition: A | Discharge status: Home / Self Care | Payer: Managed Care, Other (non HMO) | Source: Ambulatory Visit | Attending: Medical | Admitting: Medical

## 2017-04-11 ENCOUNTER — Ambulatory Visit (INDEPENDENT_AMBULATORY_CARE_PROVIDER_SITE_OTHER): Payer: Managed Care, Other (non HMO) | Admitting: Medical

## 2017-04-11 VITALS — Ht 67.0 in | Wt 155.0 lb

## 2017-04-11 DIAGNOSIS — Z96651 Presence of right artificial knee joint: Secondary | ICD-10-CM | POA: Insufficient documentation

## 2017-04-11 DIAGNOSIS — M25461 Effusion, right knee: Secondary | ICD-10-CM

## 2017-04-11 DIAGNOSIS — M25561 Pain in right knee: Principal | ICD-10-CM | POA: Insufficient documentation

## 2017-04-11 NOTE — Progress Notes (Signed)
[facility]  [address]  [place] [address]  Dept: [phone]  Dept Fax: [phone]    OFFICE VISIT    PATIENT NAME:  RAMYA VANBERGEN  MEDICAL RECORD NUMBER: [id number]    DICTATING PHYSICIAN:  Launa Flight, PA-C  REFERRING PHYSICIAN:  Royston Cowper, [place]    DOB:  Oct 24, 1965  DOS:  04/11/2017    CHIEF COMPLAINT: 4 month TKA follow-up.      ------------------------------HISTORY OF PRESENT ILLNESS----------------------------------  [name] Weissberg is a 51 y.o. female who presents for a routine 4 month post operative visit from right TKA.  Annemarie Sebree Marinos has no reported new trauma.  No reported fevers or chills. No reported feelings of instability.  she has completed outpatient physical therapy. No Medication changes to report.   She states that she did have a fall in her home 2 weeks ago and began having increasing swelling and pain in the right lateral aspect of the knee.    ----------------------------------PAST MEDICAL HISTORY------------------------------------------  Past Medical History:   Diagnosis Date    Abnormal Pap smear 2001    LSIL    Acute neck pain 2014     muscle relaxant med    Arthritis     Breast cyst     bilateral, multiple, variable sizes.     Chest pain 01/28/2008    work-up negative    Dyspepsia 2014    Zantac med    Dysplasia of cervix 2001    Colpo CIN 1    Esophageal reflux     Hypothyroidism     MVP (mitral valve prolapse) 12/04/2016    Other allergic rhinitis 12/04/2016    Psychosocial stressors 2013 - 2014     Sjogren's disease     Thyroid disorder     Unspecified breast disorder 62952 - present     large breast cysts     Valvular disease            ---------------------------------PAST SURGICAL HISTORY------------------------------------------  Past Surgical History:   Procedure Laterality Date    BIOPSY BREAST  10/12/03; 11/03/07,  04/28/2012     left breast aspirations x 2 - 2004 - 15 ml turbid and 2009 - 3 ml ; 04/28/2012 Right with  rare calcifications.    HX BREAST BIOPSY Right     needle bx.  age 33    HX CYST INCISION AND DRAINAGE Left     aspiration    HX DILATION AND CURETTAGE      HX TONSILLECTOMY  1974    REPLACEMENT TOTAL KNEE Right 12/10/2016           -----------------------------------------MEDICATIONS---------------------------------------------------    Outpatient Medications Prior to Visit:  aMILoride-hydroCHLOROthiazide (MODURETIC) 5-50 mg Oral Tablet Take 1 Tab by mouth Once a day   Ascorbic Acid (VITAMIN C) 1,000 mg Oral Tablet Take 1,000 mg by mouth Once a day   cholecalciferol, vitamin D3, 1,000 unit Oral Tablet Take 1,000 Units by mouth Once a day Patient takes 2000 IU nightly   cyanocobalamin (VITAMIN B12) 250 mcg Oral Tablet Take 250 mcg by mouth Four times daily with food   cycloSPORINE (RESTASIS) 0.05 % Ophthalmic Dropperette Instill 1 Drop into both eyes Every 12 hours   diazePAM (VALIUM) 5 mg Oral Tablet Take 1 Tab (5 mg total) by mouth Every 6 hours as needed for Anxiety   DULoxetine (CYMBALTA DR) 30 mg Oral Capsule, Delayed Release(E.C.) Take 60 mg by mouth Once a day   Glucosamine-Chondroit-Vit  C-Mn (GLUCOSAMINE 1500 COMPLEX) 500-400 mg Oral Capsule Take by mouth   Green Tea Leaf Extract (GREEN TEA) Oral Capsule Take by mouth   HYDROcodone-acetaminophen (NORCO) 7.5-325 mg Oral Tablet Take 1 Tab by mouth Every 6 hours as needed for Pain   Ibuprofen (MOTRIN) 600 mg Oral Tablet Take 1 Tab (600 mg total) by mouth Three times a day as needed for Pain for up to 90 days   immun glob G,IgG,-gly-IgA ov50 (GAMMAGARD LIQUID) 10 % Injection Solution infusion by Intravenous route One time Mix and infuse per policy of [facility].   linaclotide (LINZESS) 145 mcg Oral Capsule Take 145 mcg by mouth Once a day   liothyronine (CYTOMEL) 25 mcg Oral Tablet Take 25 mcg by mouth Once a day   multivitamin Oral Tablet Take 1 Tab by mouth Once a day   OXcarbazepine (TRILEPTAL) 150 mg Oral Tablet Take 150 mg by mouth Twice  daily   oxyCODONE (ROXICODONE) 5 mg Oral Tablet Take 1 Tab (5 mg total) by mouth Every 4 hours as needed Earliest Fill Date: 12/25/16   pilocarpine (SALAGEN) 7.5 mg Oral Tablet TAKE 1 TABLET THREE TIMES A DAY   potassium chloride (K-DUR) 20 mEq Oral Tab Sust.Rel. Particle/Crystal Take 20 mEq by mouth Once a day   raNITIdine (ZANTAC) 300 mg Oral Tablet Take 1 Tab (300 mg total) by mouth Every evening   verapamil (CALAN) 40 mg Oral Tablet Take 40 mg by mouth One at night   Zinc 50 mg Tab take by mouth.      No facility-administered medications prior to visit.     -------------------------------------------ALLERGIES------------------------------------------------------  Allergy History as of 04/11/17     TETRACYCLINE       Noted Status Severity Type Reaction    01/27/08 Janeann MerlHickman, Heather, RN 01/27/08 Active             LEVOTHYROXINE       Noted Status Severity Type Reaction    12/04/16 1427 Katina DegreeHarris, Brianna, LPN 40/98/1102/14/18 Active       Comments:  Leg pain             AMOXICILLIN-POT CLAVULANATE       Noted Status Severity Type Reaction    12/04/16 1427 Katina DegreeHarris, Brianna, LPN 91/47/8202/14/18 Active       Comments:  Extreme constipation            NITROFURANTOIN MONOHYD/M-CRYST       Noted Status Severity Type Reaction    01/10/17 1512 Adrian BlackwaterByrd, Elizabeth, [place] 01/10/17 Active                   -----------------------------------------FAMILY HISTORY----------------------------------------------  Family Medical History     Problem Relation (Age of Onset)    Breast Cancer Maternal Aunt    Cancer Son    Diabetes Paternal Grandmother    Healthy Mother    Heart Attack Son    Heart Disease Paternal Grandfather, Father    Hypertension Paternal Grandfather, Father    Leukemia Maternal Uncle    Other Mother, Sister    Uterine Fibroids Mother              ---------------------------------------SOCIAL HISTORY-------------------------------------------------  Social History     Social History    Marital status: Married     Spouse name: Kendell Baneroy    Number  of children: 3    Years of education: 12     Occupational History    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  [employer]      sp:  Postal service spvr     Social History Main Topics    Smoking status: Former Smoker     Packs/day: 0.25     Years: 2.00     Types: Cigarettes     Quit date: 10/22/1991    Smokeless tobacco: Never Used    Alcohol use No    Drug use: No    Sexual activity: Yes     Partners: Male     Birth control/ protection: Vasectomy     Other Topics Concern    Abuse/Domestic Violence No    Breast Self Exam Yes    Caffeine Concern No    Calcium Intake Adequate No    Computer Use Yes    Exercise Concern No    Helmet Use Yes    Seat Belt Yes    Special Diet Yes     low sodium diet    Sunscreen Used No    Uses Gait Assitive Device (Cane, Environmental consultant, Catering manager) No    Right Hand Dominant Yes    Left Hand Dominant No    Ambidextrous No     Social History Narrative       -------------------------------------REVIEW OF SYSTEMS---------------------------------------------  Constitutional: negative  All other ROS Negative    ------------------PHYSICAL EXAMINATION--------------------------    Ht 1.702 m (5\' 7" )   Wt 70.3 kg (155 lb)   BMI 24.28 kg/m2    GENERAL: she is alert, cooperative, no distress, appears stated age.      Orthopedic Evaluation:    Patient displays a equal and symmetric unassisted gait.    Right hip shows a full ROM, (Forward flexion 0-115 degrees, internal rotation 0-35 degrees, external rotation 0-45 degrees). FABRE testing negative. No tenderness to palpation over the greater trochanter.     Left hip shows a full ROM, (Forward flexion 0-115 degrees, internal rotation 0-35 degrees, external rotation 0-45 degrees). FABRE testing negative. No tenderness to palpation over the greater trochanter.    Left knee ROM 0 to 125 degrees.  Patella tracks midline without instability. Clark's patella compression test does not reveal crepitus or pain. No tenderness to palpation of the distal femur and  proximal tibia. No effusion is present.  Ligamental testing is stable. McMurrary testing is negative.  No Bakers cyst is present.  Skin is clear.   Right knee alignment is slight valgus.  Incision well healed. Skin clear. AROM initiated from 0 to greater than 115 degrees. Colaterall ligament testing is stable.  Patella is tracking midline without evidence of instability.  No effusion.   Mild palpable point tenderness at the Gerdy's tubercle region right lateral aspect of the knee.  Right Ankle shows a full functional range of motion.   No instability of deformity noted.Skin is clear.   Left Ankle shows a full functional range of motion.   No instability of deformity noted.Skin is clear.   Neurovascular exam completed on both extremities shows palpable dorsalis pedis pulses with brisk cappilary refill noted. Deep tendon reflexes are present.  Overall muscle definition is symmetric without focal neurologic deficiency.    Skin inspection is clear.          -------------------------------------------------IMAGING----------------------------------------------    Stable anatomic alignment of right total knee replacement without signs of fracture dislocation.  --------------------------------IMPRESSION-------------------------------    ICD-10-CM    1. s\p right TKA 12/10/2016 Z96.651          ----------------------------TREATMENT PLAN--------------------------    At this time I have reminded  Dawne M Oelkers of antibiotic prophylaxis in light of minor procedures.  We provided an educational information to reinforce this element.  I have stressed the importance of life long knee strengthening and ROM exercises. A scheduled follow up appointment has been set with x-rays in August.  She is doing well at this point and has had some mild swelling and soreness especially at the lateral aspect of the right knee. Patient has been icing and doing home exercises and we have encouraged her to continue to do this including ice  massage.  I have offered her outpatient physical therapy to work on the ITB band however she politely declined.  I did remind her that it does take a full 6 months to 1 year to fully recover from the knee replacement.  We discussed fall precautions with her today.   If any questions or concerns arise patient was informed to notify the office for a sooner follow up. All questions answered.       Note reviewed. No changes or addition.  Agree with current plan.      Nile Dear. Jaci Carrel.O.

## 2017-04-18 ENCOUNTER — Encounter (INDEPENDENT_AMBULATORY_CARE_PROVIDER_SITE_OTHER): Payer: Managed Care, Other (non HMO) | Admitting: Family

## 2017-04-21 ENCOUNTER — Encounter (INDEPENDENT_AMBULATORY_CARE_PROVIDER_SITE_OTHER): Payer: Managed Care, Other (non HMO) | Admitting: Family

## 2017-05-13 ENCOUNTER — Other Ambulatory Visit (INDEPENDENT_AMBULATORY_CARE_PROVIDER_SITE_OTHER): Payer: Self-pay | Admitting: Family

## 2017-05-27 ENCOUNTER — Other Ambulatory Visit (INDEPENDENT_AMBULATORY_CARE_PROVIDER_SITE_OTHER): Payer: Self-pay | Admitting: Family

## 2017-05-27 DIAGNOSIS — Z1239 Encounter for other screening for malignant neoplasm of breast: Secondary | ICD-10-CM

## 2017-06-07 ENCOUNTER — Ambulatory Visit (HOSPITAL_COMMUNITY): Payer: Self-pay | Admitting: Mammography

## 2017-06-10 ENCOUNTER — Telehealth (INDEPENDENT_AMBULATORY_CARE_PROVIDER_SITE_OTHER): Payer: Self-pay | Admitting: Family

## 2017-06-10 NOTE — Telephone Encounter (Signed)
Dr Lazaro Arms has prescribed amitriptyline and she's thinking that she's had a reaction to that and cant take it. She wants to know your thoughts. Katina Degree, LPN  0/76/8088, 14:39

## 2017-06-10 NOTE — Telephone Encounter (Signed)
Called the patient to ask what type of reaction she had while taking amitriptyline. Patient said she has taken this medication before and stopped taking it but couldn't remember why she stopped. Wanted to see if Denny Peon could look in her notes to see why she stopped this medication.

## 2017-06-10 NOTE — Telephone Encounter (Signed)
Not sure what kind of reaction? Amitriptyline has multiple SE.   What is she experiencing.  Thanks,  Royston Cowper, CFNP

## 2017-06-10 NOTE — Telephone Encounter (Signed)
She took the amitriptyline 50mg  10/2015 and it knocked her out, so we decreased it to 10mg  on 03/12/16, she never had it refilled after that, maybe related to it still making her too tired?   Thanks,  Royston Cowper, CFNP

## 2017-06-11 ENCOUNTER — Ambulatory Visit (HOSPITAL_COMMUNITY): Payer: Self-pay

## 2017-06-11 NOTE — Telephone Encounter (Signed)
Patient has been scheduled for an appointment on 07/08/17 at 11:30am [name], LPN  0/35/4656, 10:10

## 2017-06-11 NOTE — Telephone Encounter (Signed)
Ok to schedule visit, see if we have 2 slots to save for her  Thanks,  Royston Cowper, CFNP

## 2017-06-11 NOTE — Telephone Encounter (Signed)
Called the patient to let her know about the reaction she was having to this medication. Patient has moved to [facility] and wants to know if you would like a final visit with her while she is in town (Dates September 17-21. Monday, Tuesday and Thursday in the afternoon and anytime on Wednesday) [name], LPN  1/58/3094, 09:28

## 2017-06-12 ENCOUNTER — Encounter (HOSPITAL_BASED_OUTPATIENT_CLINIC_OR_DEPARTMENT_OTHER): Payer: Self-pay | Admitting: Adult Reconstructive Orthopaedic Surgery

## 2017-07-02 ENCOUNTER — Other Ambulatory Visit (HOSPITAL_BASED_OUTPATIENT_CLINIC_OR_DEPARTMENT_OTHER): Payer: Self-pay | Admitting: Medical

## 2017-07-02 DIAGNOSIS — Z96651 Presence of right artificial knee joint: Secondary | ICD-10-CM

## 2017-07-07 ENCOUNTER — Ambulatory Visit: Payer: Managed Care, Other (non HMO)

## 2017-07-07 DIAGNOSIS — E039 Hypothyroidism, unspecified: Secondary | ICD-10-CM | POA: Insufficient documentation

## 2017-07-07 DIAGNOSIS — D839 Common variable immunodeficiency, unspecified: Secondary | ICD-10-CM

## 2017-07-07 DIAGNOSIS — G629 Polyneuropathy, unspecified: Secondary | ICD-10-CM | POA: Insufficient documentation

## 2017-07-07 LAB — CBC WITH DIFF
BASOPHIL #: 0 x10ˆ3/uL (ref 0.00–0.20)
BASOPHIL %: 1 %
EOSINOPHIL #: 0.2 x10ˆ3/uL (ref 0.00–0.50)
EOSINOPHIL %: 3 %
HCT: 41 % (ref 34.6–46.2)
HGB: 14 g/dL (ref 11.8–15.8)
LYMPHOCYTE #: 1 x10ˆ3/uL (ref 0.90–3.40)
LYMPHOCYTE %: 18 %
MCH: 29 pg (ref 27.6–33.2)
MCHC: 34.3 g/dL (ref 32.6–35.4)
MCV: 84.7 fL (ref 82.3–96.7)
MONOCYTE #: 0.5 x10?3/uL (ref 0.20–0.90)
MONOCYTE #: 0.5 x10ˆ3/uL (ref 0.20–0.90)
MONOCYTE %: 8 %
MPV: 9.4 fL (ref 6.6–10.2)
NEUTROPHIL #: 4.2 x10ˆ3/uL (ref 1.50–6.40)
NEUTROPHIL %: 71 %
PLATELETS: 169 x10ˆ3/uL (ref 140–440)
RBC: 4.84 x10ˆ6/uL (ref 3.80–5.24)
RDW: 14.2 % (ref 12.4–15.2)
WBC: 5.9 x10ˆ3/uL (ref 3.5–10.3)

## 2017-07-07 LAB — ALBUMIN FOR ELECTROPHORESIS: ALBUMIN: 4.1 g/dL (ref 3.5–5.0)

## 2017-07-07 LAB — IMMUNOGLOBULIN G (IGG), SERUM: IMMUNOGLOBULIN G (IGG): 662 mg/dL (ref 650–1600)

## 2017-07-07 LAB — PROTEIN FOR ELECTROPHORESIS: PROTEIN TOTAL: 6.8 g/dL (ref 6.0–7.9)

## 2017-07-07 LAB — FOLATE: FOLATE: 20.1 ng/mL (ref >=5.9)

## 2017-07-07 LAB — IMMUNOGLOBULIN M (IGM), SERUM: IMMUNOGLOBULIN M (IGM): 49 mg/dL — ABNORMAL LOW (ref 50–300)

## 2017-07-07 LAB — IMMUNOGLOBULIN A (IGA), SERUM: IMMUNOGLOBULIN A (IGA): 71 mg/dL (ref 40–350)

## 2017-07-07 LAB — VITAMIN B12: VITAMIN B 12: >1500 pg/mL — ABNORMAL HIGH (ref 180–914)

## 2017-07-08 ENCOUNTER — Ambulatory Visit (INDEPENDENT_AMBULATORY_CARE_PROVIDER_SITE_OTHER): Payer: Managed Care, Other (non HMO) | Admitting: Family

## 2017-07-08 ENCOUNTER — Other Ambulatory Visit (HOSPITAL_BASED_OUTPATIENT_CLINIC_OR_DEPARTMENT_OTHER): Payer: Self-pay | Admitting: Medical

## 2017-07-08 ENCOUNTER — Ambulatory Visit (INDEPENDENT_AMBULATORY_CARE_PROVIDER_SITE_OTHER): Payer: Managed Care, Other (non HMO) | Admitting: Medical

## 2017-07-08 ENCOUNTER — Encounter (HOSPITAL_BASED_OUTPATIENT_CLINIC_OR_DEPARTMENT_OTHER): Payer: Self-pay | Admitting: Medical

## 2017-07-08 ENCOUNTER — Encounter (INDEPENDENT_AMBULATORY_CARE_PROVIDER_SITE_OTHER): Payer: Self-pay | Admitting: Family

## 2017-07-08 ENCOUNTER — Ambulatory Visit
Admission: RE | Admit: 2017-07-08 | Discharge: 2017-07-08 | Disposition: A | Discharge status: Home / Self Care | Payer: Managed Care, Other (non HMO) | Source: Ambulatory Visit | Attending: Medical | Admitting: Medical

## 2017-07-08 VITALS — BP 112/70 | Ht 67.0 in | Wt 160.0 lb

## 2017-07-08 VITALS — BP 142/80 | HR 93 | Temp 98.1°F | Resp 16 | Ht 67.0 in | Wt 164.0 lb

## 2017-07-08 DIAGNOSIS — K1120 Sialoadenitis, unspecified: Secondary | ICD-10-CM

## 2017-07-08 DIAGNOSIS — M170 Bilateral primary osteoarthritis of knee: Secondary | ICD-10-CM

## 2017-07-08 DIAGNOSIS — M1712 Unilateral primary osteoarthritis, left knee: Secondary | ICD-10-CM

## 2017-07-08 DIAGNOSIS — F32A Depression, unspecified: Secondary | ICD-10-CM

## 2017-07-08 DIAGNOSIS — Z96651 Presence of right artificial knee joint: Principal | ICD-10-CM

## 2017-07-08 DIAGNOSIS — M7631 Iliotibial band syndrome, right leg: Secondary | ICD-10-CM

## 2017-07-08 DIAGNOSIS — F329 Major depressive disorder, single episode, unspecified: Secondary | ICD-10-CM

## 2017-07-08 DIAGNOSIS — E039 Hypothyroidism, unspecified: Secondary | ICD-10-CM

## 2017-07-08 DIAGNOSIS — M25562 Pain in left knee: Secondary | ICD-10-CM | POA: Insufficient documentation

## 2017-07-08 DIAGNOSIS — N926 Irregular menstruation, unspecified: Secondary | ICD-10-CM

## 2017-07-08 LAB — IMMUNOGLOBULIN SUBCLASS IGG4

## 2017-07-08 LAB — PROTEIN ELECTROPHORESIS, SERUM (SPEP)
ALBUMIN: 4.1 g/dL
PATHOLOGIST INTERPRETATION SPEP: NORMAL
TOTAL PROTEIN: 6.8 g/dL

## 2017-07-08 LAB — B-CELL PHENOTYPING FOR IMMUNODEFICIENCY/IMMUNE COMPETENCE BY FLOW CYTOMETRY
CD16+ CD56+ % (NK CELL): 7 %
CD19 % (B CELL): 27 %

## 2017-07-08 LAB — IGG SUBCLASSES, SERUM
IGG 1: 322 mg/dL — ABNORMAL LOW (ref 341–894)
IGG 2: 271 mg/dL (ref 171–632)
IGG 3: 24.4 mg/dL (ref 18.4–106.0)
IGG 4: 14.4 mg/dL (ref 2.4–121.0)
TOTAL IGG: 677 mg/dL — ABNORMAL LOW (ref 767–1590)

## 2017-07-08 LAB — QUANTITATIVE LYMPHOCYTE SUBSETS (T,B, AND NK CELLS)
CD16+ CD56+ # (NK CELL): 68 {cells}/uL
CD19 # (B CELL): 267 {cells}/uL (ref 117–620)
CD3 # (T CELL): 665 {cells}/uL — ABNORMAL LOW (ref 892–2436)
CD3 % (T CELL): 67 %
CD3+ CD4+ # (Helper T Cell): 651 {cells}/uL (ref 382–1614)
CD3+ CD4+ % (Helper T Cell): 65 %
CD3+CD8+# (Suppressor T Cell): 221 {cells}/uL (ref 157–813)
CD3+CD8+% (Suppressor T Cell): 22 %
CD4:CD8 RATIO: 2.9 (ref 1.0–3.4)

## 2017-07-08 LAB — COMPLEMENT, TOTAL (CH50), SERUM: COMPLEMENT, TOTAL (CH50), SERUM: 68 U/mL (ref 30–75)

## 2017-07-08 LAB — THYROID STIMULATING HORMONE (SENSITIVE TSH): TSH: 2.845 u[IU]/mL (ref 0.450–5.330)

## 2017-07-08 MED ORDER — CEFDINIR 300 MG CAPSULE: 300 mg | Cap | Freq: Two times a day (BID) | ORAL | 0 refills | 0 days | Status: AC

## 2017-07-08 MED ORDER — BUPROPION HCL XL 150 MG 24 HR TABLET, EXTENDED RELEASE
150.0000 mg | ORAL_TABLET | Freq: Every day | ORAL | 5 refills | Status: DC
Start: 2017-07-08 — End: 2017-12-28

## 2017-07-08 NOTE — Progress Notes (Signed)
Chart, Medications, PMH, [facility] as documented in the chart were reviewed with the patient.     Tracie [name] is a 51 y.o. female who presents today for;     Chief Complaint   Patient presents with   . Anxiety     lot of changes in last year. still with neuropathy issues, has seen dr. Greer Pickerel, changed her meds today, not sure to what. has moved to [facility], retired. has been able to walk and swim more. always uptight esp with the neuropathy. no SI/HI. has been on cymbalta, effexor, trintellix, lexapro. has not really worked in past. depressed with wt gain. has PCP in [place], will f/u there when goes back. having hormone issues, sore breasts, irregular periods.    . Jaw Pain     left lower jaw, + tenderness to press on lump, no fevers. lump getting bigger     Patient Active Problem List    Diagnosis Date Noted   . s\p right TKA 12/10/2016 12/26/2016   . Hip bursitis 12/04/2016   . Vitamin D deficiency 12/04/2016   . Acquired hypothyroidism 12/04/2016   . IgG deficiency (CMS HCC) 12/04/2016   . Chronic constipation 12/04/2016   . Small fiber neuropathy (CMS HCC) 12/04/2016   . Other allergic rhinitis 12/04/2016   . MVP (mitral valve prolapse) 12/04/2016   . Routine health maintenance 12/04/2016     Annual eye exam: 2015  Annual dental exam:  Annual physical  Breast Exam  Cologuard:   Colonoscopy, result: 05/2012 nml- dr Elmore Guise   Dexa Scan, result:   Fecal Occult Blood, result   Immunizations: Tdap (), Prevnar-13(), Pneumovax (), Zostavax ()  Mammogram, result: 10/31/16 cat 0- [place] to r/l bil breast recheck   Pap, result: 2014 [place]  Diagnostic: spine xray- scoliosis, xray knee- 12/16/11 r knee nml, r hip stable mild subchondral cyst in superior acetabulum. R femur nml. 05/31/11 right hip mild DJD, echo 2016 dr reddy, stress test 2016 dr reddy.        . Primary immunodeficiency disorder (CMS HCC) 02/01/2016   . Primary osteoarthritis involving multiple joints 02/01/2016   . Fibromyalgia syndrome 02/01/2016   . Psychosocial stressors  11/17/2012     Family - multiple     . Dyspepsia 11/17/2012     Zantac med     . Breast cyst 09/11/2011     Bilateral Breast Cysts 2004 - present  Hx of left breast aspiration in 2004 and 2009   04/22/2012 Change in right breast cyst area - Breast imaging ordered.    04/28/2012 RIGHT BREAST 12 O'CLOCK, CORE NEEDLE BIOPSY:  - Breast tissue with abundant histiocytes and inflammatory cells  (lymphocytes, scattered neutrophils and   scattered eosinophils). The changes are compatible with fat necrosis.   - Rare microcalcification.  - No malignancy identified.         . Sjogren's syndrome (CMS HCC) 01/28/2008   . Hyperlipidemia, acquired 01/28/2008   . Atrial paroxysmal tachycardia (CMS HCC) 01/28/2008     On fleicanide        Past Surgical History:   Procedure Laterality Date   . BIOPSY BREAST  10/12/03; 11/03/07,  04/28/2012     left breast aspirations x 2 - 2004 - 15 ml turbid and 2009 - 3 ml ; 04/28/2012 Right with rare calcifications.   . HX BREAST BIOPSY Right     needle bx.  age 84   . HX CYST INCISION AND DRAINAGE Left     aspiration   .  HX DILATION AND CURETTAGE     . HX TONSILLECTOMY  1974   . REPLACEMENT TOTAL KNEE Right 12/10/2016         Family Medical History     Problem Relation (Age of Onset)    Breast Cancer Maternal Aunt    Cancer Son    Diabetes Paternal Grandmother    Healthy Mother    Heart Attack Son    Heart Disease Paternal Grandfather, Father    Hypertension Paternal Grandfather, Father    Leukemia Maternal Uncle    Other Mother, Sister    Uterine Fibroids Mother            Social History     Social History   . Marital status: Married     Spouse name: Kendell Bane   . Number of children: 3   . Years of education: 12     Occupational History   . 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   .  [employer]      sp:  Postal service spvr     Social History Main Topics   . Smoking status: Former Smoker     Packs/day: 0.25     Years: 2.00     Types: Cigarettes     Quit date: 10/22/1991   . Smokeless tobacco: Never Used   . Alcohol  use No   . Drug use: No   . Sexual activity: Yes     Partners: Male     Birth control/ protection: Vasectomy     Other Topics Concern   . Abuse/Domestic Violence No   . Breast Self Exam Yes   . Caffeine Concern No   . Calcium Intake Adequate No   . Computer Use Yes   . Exercise Concern No   . Helmet Use Yes   . Seat Belt Yes   . Special Diet Yes     low sodium diet   . Sunscreen Used No   . Uses Gait Assitive Device (Cane, Walker, Etc) No   . Right Hand Dominant Yes   . Left Hand Dominant No   . Ambidextrous No     Social History Narrative      Immunization History   Administered Date(s) Administered   . Influenza Vaccine IM (ADMIN) 08/30/2016       Allergies: Augmentin [amoxicillin-pot clavulanate]; Macrobid [nitrofurantoin monohyd/m-cryst]; Synthroid [levothyroxine]; and Tetracycline     Current Outpatient Prescriptions   Medication Sig   . aMILoride-hydroCHLOROthiazide (MODURETIC) 5-50 mg Oral Tablet Take 1 Tab by mouth Once a day   . amitriptyline (ELAVIL) 10 mg Oral Tablet Take 10 mg by mouth Every night   . Ascorbic Acid (VITAMIN C) 1,000 mg Oral Tablet Take 1,000 mg by mouth Once a day   . buPROPion (WELLBUTRIN XL) 150 mg extended release 24 hr tablet Take 1 Tab (150 mg total) by mouth Once a day   . cefdinir (OMNICEF) 300 mg Oral Capsule Take 1 Cap (300 mg total) by mouth Twice daily for 10 days   . cholecalciferol, vitamin D3, 1,000 unit Oral Tablet Take 1,000 Units by mouth Once a day Patient takes 2000 IU nightly   . clonazePAM (KLONOPIN) 0.5 mg Oral Tablet TAKE 1 TABLET BY MOUTH TWICE A DAY   . cyanocobalamin, vitamin B-12, (VITAMIN B-12 ORAL) Take 6,000 mg by mouth   . cycloSPORINE (RESTASIS) 0.05 % Ophthalmic Dropperette Instill 1 Drop into both eyes Every 12 hours   . Glucosamine-Chondroit-Vit  C-Mn (GLUCOSAMINE 1500 COMPLEX) 500-400 mg Oral Capsule Take by mouth   . Green Tea Leaf Extract (GREEN TEA) Oral Capsule Take 500 Each by mouth    . Ibuprofen (MOTRIN) 600 mg Oral Tablet Take 600 mg by mouth  Four times a day as needed for Pain   . linaclotide (LINZESS) 145 mcg Oral Capsule Take 145 mcg by mouth Once a day   . liothyronine (CYTOMEL) 25 mcg Oral Tablet TAKE 1 TABLET DAILY   . multivitamin Oral Tablet Take 1 Tab by mouth Once a day   . Non-Formulary/Special Preparation (MEDICATION HELP) Hydroxy elite diet supplement   . OXcarbazepine (TRILEPTAL) 150 mg Oral Tablet Take 150 mg by mouth Twice daily   . pilocarpine (SALAGEN) 7.5 mg Oral Tablet TAKE 1 TABLET THREE TIMES A DAY   . potassium chloride (K-DUR) 20 mEq Oral Tab Sust.Rel. Particle/Crystal Take 20 mEq by mouth Once a day   . raNITIdine (ZANTAC) 300 mg Oral Tablet Take 1 Tab (300 mg total) by mouth Every evening   . Zinc 50 mg Tab take by mouth.        Wt Readings from Last 3 Encounters:   07/08/17 74.4 kg (164 lb)   07/08/17 72.6 kg (160 lb)   04/11/17 70.3 kg (155 lb)       ROS:  Constitutional: Denies fevers, chills, sweats, fatigue and anorexia  SKIN: Denies rashes, changes in hair, skin or nails  Eyes: Denies visual disturbance, eye irritation or redness  HEENT: Denies Rhinorrhea, ear pain, tinnitus, oral lesions or sore throat.  NECK: Denies neck masses, pain or stiffness  Respiratory: Denies cough, wheezing or dyspnea  Cardiology: Denies chest pain, heart palpitations, or  lower extremity edema  GI: Denies reflux symptoms, nausea, vomiting, change in bowel habits, diarrhea, constipation or abdominal pain  GU: Denies urinary frequency, dysuria, nocturia, hesitancy  Hematologic: Denies bruising, excessive bleeding and lymphadenopathy  Musculoskeletal: Denies myalgias, stiff joints, back pain, muscle weakness and joint pain  Neuro: Denies headaches, dizziness, vertigo, paresthesia and weakness  Psychiatric: Denies anxiety, depression, mood swings, suicidal/homicidal ideation and sleep disturbance  ENDO: Denies change in appetite or weight    BP Readings from Last 3 Encounters:   07/08/17 (!) 142/80   07/08/17 112/70   01/03/17 112/70       Physical  Exam  Vitals: BP (!) 142/80  Pulse 93  Temp 36.7 C (98.1 F) (Tympanic)   Resp 16  Ht 1.702 m ( )  Wt 74.4 kg (164 lb)  SpO2 97%  BMI 25.69 kg/m2 Body mass index is 25.69 kg/(m^2).  PHYSICAL EXAM  ( ? = WNL;  X = Abnormal; D = Deferred)   AREA Element of Exam WNL Abnormal Findings   GEN Alert, Well-Developed, Well Nourished, No Abuse detected, No apparent distress  ?     Vital Signs Stable ?    EYE PERRLA, EOMI ?     Sclera and Conjunctiva Clear ?    ENT TM Bil. Intact, pearly grey, hearing grossly intact ?     Nasal turbinate pink, moist bil., no inflammation ?     Oropharnyx moist, no exudates, no erythema ?    NECK Supple, No Mass, No lymphadenopathy, no JVD ?     Trachea midline, No Thyromegaly ?    RESP Symmetrical, No Accessory Muscle Use ?     Clear, Breath sounds equal bil ?    CV HRRR, no murmurs, rubs, gallops ?     No Pretibial  Edema ?     Bil. Equal Palpable Radial, Pedal, Postib Pulses ?    ABD Soft, nontender, good BS, No Mass ?     No organomegaly, guarding or rebound  ?    MUSC   Gait Steady, Good ROM without assistive device ?     Symmetrical strength all extremities ?    NEURO Digits and nails without tenderness or abnormality ?     Cranial Nerves II - XII grossly intact ?     Normal Sensation ?    SKIN No rash, no erythema, no lesions X LEFT parotid glad tenderness, 1X1 nodule, slightly warm    Warm, dry, normal turgor ?    PSYCH Alert, oriented to time, person, place ?     Normal mood & affect ?        ASSESSMENT/PLAN:  1. Depression, unspecified depression type  And anxiety. Pt has tried and failed: Cymbalta, effexor, trintellix, lexapro. Will trial wellbutrin    2. Irregular periods  Discussed perimenopausal issues. Do not recommend HRT. Cont. Increase exercise, MVI    3. Acquired hypothyroidism  Check on cytomel  - THYROID STIMULATING HORMONE (SENSITIVE TSH); Future    4. Parotiditis  Start lemon drops q 2 hours, warm moist compress, cefdinir, pt v/u    Orders Placed This Encounter    . THYROID STIMULATING HORMONE (SENSITIVE TSH)   . buPROPion (WELLBUTRIN XL) 150 mg extended release 24 hr tablet   . cefdinir (OMNICEF) 300 mg Oral Capsule       Counseled RE:  Medications, diagnosis, treatment plan, diet and exercise.  BMI addressed: Advised on diet, weight loss, and exercise to reduce above normal BMI.      Time in care: 20  Counsel > 50% YES  Follow Up: Return if symptoms worsen or fail to improve.      Royston Cowper, APRN    [facility]  [address] [place] [facility]  [address]  [place] [address]  [phone]  07/08/2017

## 2017-07-10 NOTE — Progress Notes (Signed)
[facility], MEDICAL OFFICE BUILDING  [address]  [place] [address]  Dept: [phone]  Dept Fax: [phone]    OFFICE VISIT    PATIENT NAME:  Tracie [name]  MEDICAL RECORD NUMBER: [id number]    DICTATING PHYSICIAN:  Launa Flight, PA-C  REFERRING PHYSICIAN:  Royston Cowper, [place]    DOB:  1965-12-13  DOS:  07/10/2017    CHIEF COMPLAINT: 6 month TKA follow-up.      ------------------------------HISTORY OF PRESENT ILLNESS----------------------------------  [name] Wolin is a 51 y.o. female who presents for a routine 6 month post operative visit from right TKA.  Waver Dibiasio Alcantar has no reported new trauma.  No reported fevers or chills. No reported feelings of instability.   She has been struggling with the ongoing right ITB band pain since surgery. She has attempted outpatient physical therapy including aquatic therapy and ASTYM.  She also complains of mild left knee pain at times. The patient tells me that she is moving to [place] and will continue to follow up in [place] from this point on.    ----------------------------------PAST MEDICAL HISTORY------------------------------------------  Past Medical History:   Diagnosis Date   . Abnormal Pap smear 2001    LSIL   . Acute neck pain 2014     muscle relaxant med   . Arthritis    . Breast cyst     bilateral, multiple, variable sizes.    . Chest pain 01/28/2008    work-up negative   . Dyspepsia 2014    Zantac med   . Dysplasia of cervix 2001    Colpo CIN 1   . Esophageal reflux    . Hypothyroidism    . MVP (mitral valve prolapse) 12/04/2016   . Other allergic rhinitis 12/04/2016   . Psychosocial stressors 2013 - 2014    . Sjogren's disease    . Thyroid disorder    . Unspecified breast disorder [id number] - present     large breast cysts    . Valvular disease            ---------------------------------PAST SURGICAL HISTORY------------------------------------------  Past Surgical History:   Procedure Laterality Date   . BIOPSY BREAST  10/12/03;  11/03/07,  04/28/2012     left breast aspirations x 2 - 2004 - 15 ml turbid and 2009 - 3 ml ; 04/28/2012 Right with rare calcifications.   . HX BREAST BIOPSY Right     needle bx.  age 34   . HX CYST INCISION AND DRAINAGE Left     aspiration   . HX DILATION AND CURETTAGE     . HX TONSILLECTOMY  1974   . REPLACEMENT TOTAL KNEE Right 12/10/2016           -----------------------------------------MEDICATIONS---------------------------------------------------    Outpatient Medications Prior to Visit:  aMILoride-hydroCHLOROthiazide (MODURETIC) 5-50 mg Oral Tablet Take 1 Tab by mouth Once a day   Ascorbic Acid (VITAMIN C) 1,000 mg Oral Tablet Take 1,000 mg by mouth Once a day   cholecalciferol, vitamin D3, 1,000 unit Oral Tablet Take 1,000 Units by mouth Once a day Patient takes 2000 IU nightly   cycloSPORINE (RESTASIS) 0.05 % Ophthalmic Dropperette Instill 1 Drop into both eyes Every 12 hours   Glucosamine-Chondroit-Vit C-Mn (GLUCOSAMINE 1500 COMPLEX) 500-400 mg Oral Capsule Take by mouth   Green Tea Leaf Extract (GREEN TEA) Oral Capsule Take 500 Each by mouth    linaclotide (LINZESS) 145 mcg Oral Capsule Take 145 mcg by mouth Once a day  liothyronine (CYTOMEL) 25 mcg Oral Tablet TAKE 1 TABLET DAILY   multivitamin Oral Tablet Take 1 Tab by mouth Once a day   OXcarbazepine (TRILEPTAL) 150 mg Oral Tablet Take 150 mg by mouth Twice daily   pilocarpine (SALAGEN) 7.5 mg Oral Tablet TAKE 1 TABLET THREE TIMES A DAY   potassium chloride (K-DUR) 20 mEq Oral Tab Sust.Rel. Particle/Crystal Take 20 mEq by mouth Once a day   raNITIdine (ZANTAC) 300 mg Oral Tablet Take 1 Tab (300 mg total) by mouth Every evening   Zinc 50 mg Tab take by mouth.    cyanocobalamin (VITAMIN B12) 250 mcg Oral Tablet Take 250 mcg by mouth Four times daily with food   diazePAM (VALIUM) 5 mg Oral Tablet Take 1 Tab (5 mg total) by mouth Every 6 hours as needed for Anxiety   DULoxetine (CYMBALTA DR) 30 mg Oral Capsule, Delayed Release(E.C.) Take 60 mg by mouth Once a  day   HYDROcodone-acetaminophen (NORCO) 7.5-325 mg Oral Tablet Take 1 Tab by mouth Every 6 hours as needed for Pain   immun glob G,IgG,-gly-IgA ov50 (GAMMAGARD LIQUID) 10 % Injection Solution infusion by Intravenous route One time Mix and infuse per policy of [facility].   oxyCODONE (ROXICODONE) 5 mg Oral Tablet Take 1 Tab (5 mg total) by mouth Every 4 hours as needed Earliest Fill Date: 12/25/16   verapamil (CALAN) 40 mg Oral Tablet Take 40 mg by mouth One at night     No facility-administered medications prior to visit.     -------------------------------------------ALLERGIES------------------------------------------------------  Allergy History as of 07/10/17     TETRACYCLINE       Noted Status Severity Type Reaction    01/27/08 Janeann Merl, RN 01/27/08 Active             LEVOTHYROXINE       Noted Status Severity Type Reaction    12/04/16 1427 Katina Degree, LPN 86/57/84 Active       Comments:  Leg pain             AMOXICILLIN-POT CLAVULANATE       Noted Status Severity Type Reaction    12/04/16 1427 Katina Degree, LPN 69/62/95 Active       Comments:  Extreme constipation            NITROFURANTOIN MONOHYD/M-CRYST       Noted Status Severity Type Reaction    01/10/17 1512 [name], [place] 01/10/17 Active                   -----------------------------------------FAMILY HISTORY----------------------------------------------  Family Medical History     Problem Relation (Age of Onset)    Breast Cancer Maternal Aunt    Cancer Son    Diabetes Paternal Grandmother    Healthy Mother    Heart Attack Son    Heart Disease Paternal Grandfather, Father    Hypertension Paternal Grandfather, Father    Leukemia Maternal Uncle    Other Mother, Sister    Uterine Fibroids Mother              ---------------------------------------SOCIAL HISTORY-------------------------------------------------  Social History     Social History   . Marital status: Married     Spouse name: Kendell Bane   . Number of children: 3   . Years  of education: 12     Occupational History   . 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   .  [employer]      sp:  Fish farm manager  service spvr     Social History Main Topics   . Smoking status: Former Smoker     Packs/day: 0.25     Years: 2.00     Types: Cigarettes     Quit date: 10/22/1991   . Smokeless tobacco: Never Used   . Alcohol use No   . Drug use: No   . Sexual activity: Yes     Partners: Male     Birth control/ protection: Vasectomy     Other Topics Concern   . Abuse/Domestic Violence No   . Breast Self Exam Yes   . Caffeine Concern No   . Calcium Intake Adequate No   . Computer Use Yes   . Exercise Concern No   . Helmet Use Yes   . Seat Belt Yes   . Special Diet Yes     low sodium diet   . Sunscreen Used No   . Uses Gait Assitive Device (Cane, Walker, Etc) No   . Right Hand Dominant Yes   . Left Hand Dominant No   . Ambidextrous No     Social History Narrative       -------------------------------------REVIEW OF SYSTEMS---------------------------------------------  Constitutional: negative  Musculoskeletal:positive for right lateral knee pain  All other ROS Negative      ------------------PHYSICAL EXAMINATION--------------------------    BP 112/70  Ht 1.702 m ( )  Wt 72.6 kg (160 lb)  BMI 25.06 kg/m2    GENERAL: she is alert, cooperative, no distress, appears stated age.      Orthopedic Evaluation:      Patient displays a equal and symmetric unassisted gait.    Right hip shows a full ROM, (Forward flexion 0-115 degrees, internal rotation 0-35 degrees, external rotation 0-45 degrees). FABRE testing negative. No tenderness to palpation over the greater trochanter.     Left hip shows a full ROM, (Forward flexion 0-115 degrees, internal rotation 0-35 degrees, external rotation 0-45 degrees). FABRE testing negative. No tenderness to palpation over the greater trochanter.    Left knee ROM 0 to 120 degrees.  Patella tracks midline without instability. Clark's patella compression test does not reveal crepitus or pain. No  tenderness to palpation of the distal femur and proximal tibia. No effusion is present.  Ligamental testing is stable. McMurrary testing is negative.  No Baker's cyst is present.  Skin is clear.   Right knee alignment is slight valgus.  Incision well healed. Skin clear. AROM initiated from 0 to greater than 115 degrees. Colaterall ligament testing is stable.  Patella is tracking midline without evidence of instability.  No effusion.   Right Ankle shows a full functional range of motion.   No instability of deformity noted.Skin is clear.   Left Ankle shows a full functional range of motion.   No instability of deformity noted.Skin is clear.   Neurovascular exam completed on both extremities shows palpable dorsalis pedis pulses with brisk cappilary refill noted. Deep tendon reflexes are present.  Overall muscle definition is symmetric without focal neurologic deficiency.    Skin inspection is clear.          -------------------------------------------------IMAGING----------------------------------------------  Xrays of the right knee were independently read and reviewed with patient.  Standard weight bearing films of the right knee reveal a tricompartmental arthroplasty.  Anatomically placed.  No changes noted in component alignment.  No bony abnormality identified.     FINDINGS:     The bones, joints, and soft tissues are within normal limits. There is no  evidence  of fracture.    IMPRESSION:  Impression: No significant bony abnormality seen.    --------------------------------IMPRESSION-------------------------------    ICD-10-CM    1. s\p right TKA 12/10/2016 Z96.651    2. It band syndrome, right M76.31    3. Primary osteoarthritis of left knee M17.12          ----------------------------TREATMENT PLAN--------------------------    At this time I have reminded Deslyn M Colgate of antibiotic prophylaxis in light of minor procedures.  We provided an educational information to reinforce this element.  I have  stressed the importance of life long knee strengthening and ROM exercises. A scheduled follow up appointment has been set with x-rays in 6 months.  If any questions or concerns arise patient was informed to notify the office for a sooner follow up. All questions answered.   We discussed treatment for her right knee pain including injection management for IT band pain.  She would like to move forward with IT band injection at the insertion point.      In regards to follow up the patient is moving to [place].  We have given her several names of adult reconstruction surgeons to follow up with.      PROCEDURE:    I reviewed with the patient today the risks, benefits, rationale, expected outcomes and potential complications of undergoing Right Distal IT band injection management.  Consent has been obtained and a signed copy has been placed on the chart.  Procedure:  I injected right distal IT band insertion at Gerdy's tubercle today with 2 CC Sensorcaine, 2 CC Lidocaine and 40 MG DepoMedrol.  Patient tolerated the procedure very well.  All sterile techniques were utilized.   she will follow up once again as needed.     I used greater than 50% of my exam plan discussion greater than 45 min was utilized the patient today.    Launa Flight, PA-C 07/10/2017           Nile Dear. Lilymarie Scroggins DO

## 2017-07-16 ENCOUNTER — Other Ambulatory Visit (INDEPENDENT_AMBULATORY_CARE_PROVIDER_SITE_OTHER): Payer: Self-pay | Admitting: Family

## 2017-08-03 ENCOUNTER — Other Ambulatory Visit (INDEPENDENT_AMBULATORY_CARE_PROVIDER_SITE_OTHER): Payer: Self-pay | Admitting: Family

## 2017-08-16 ENCOUNTER — Other Ambulatory Visit: Payer: Self-pay

## 2017-08-26 IMAGING — DX CHEST PA AND LATERAL
1 series · 4 of 4 positions shown · non-contrast
Comparison: None

CLINICAL INDICATION: Tachycardia and nausea. Immunodeficiency disease

[Series 1: PA · U · 0.14mm/px · 4 of 4 slices shown]
[im 1/4]
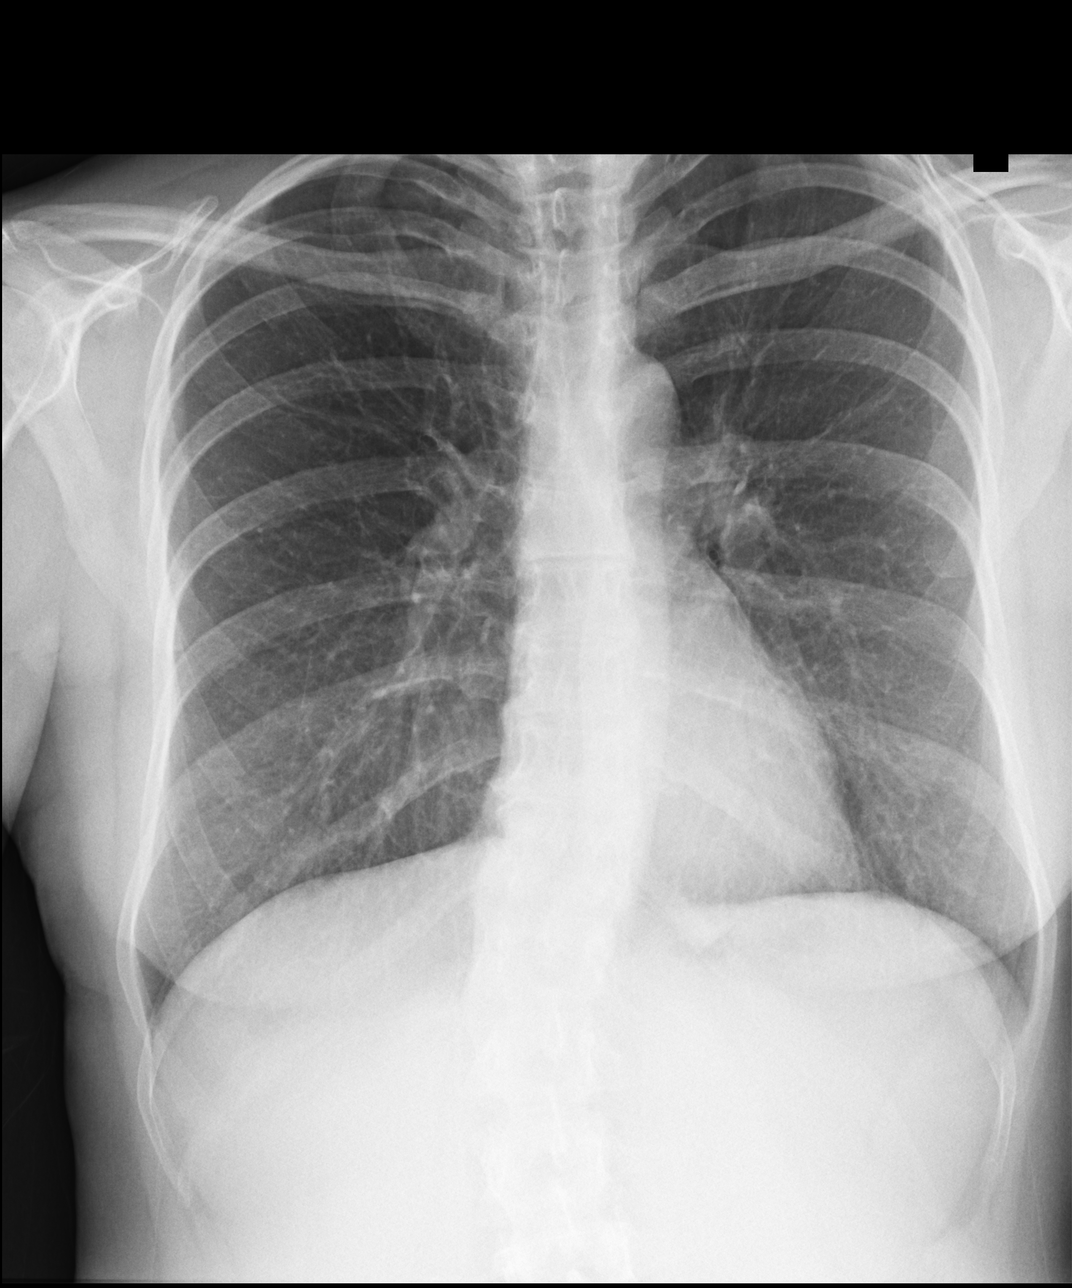
[im 2/4]
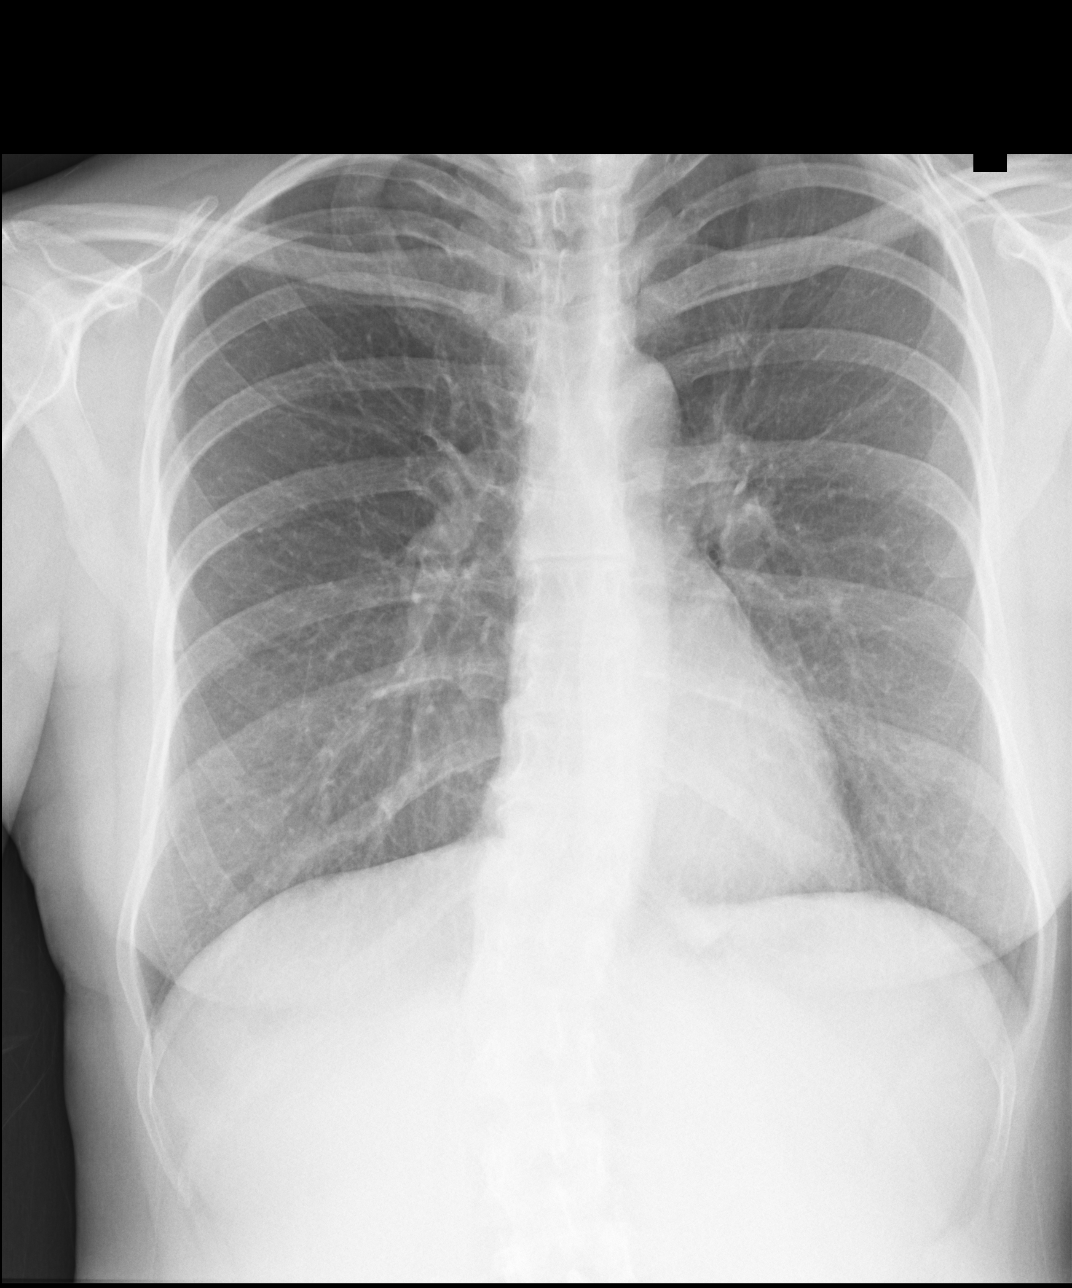
[im 3/4]
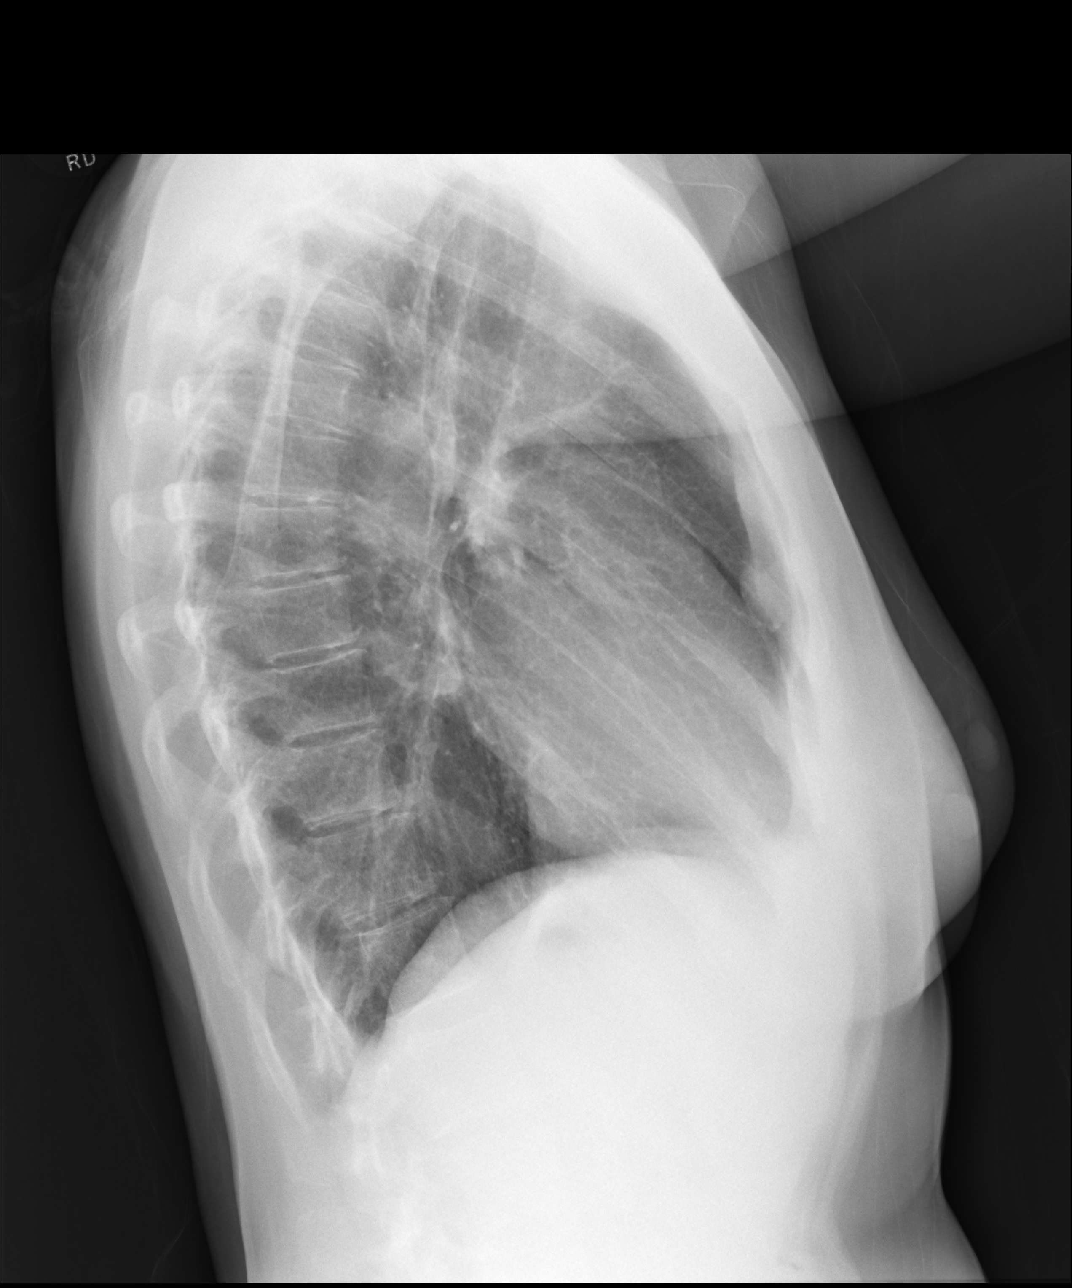
[im 4/4]
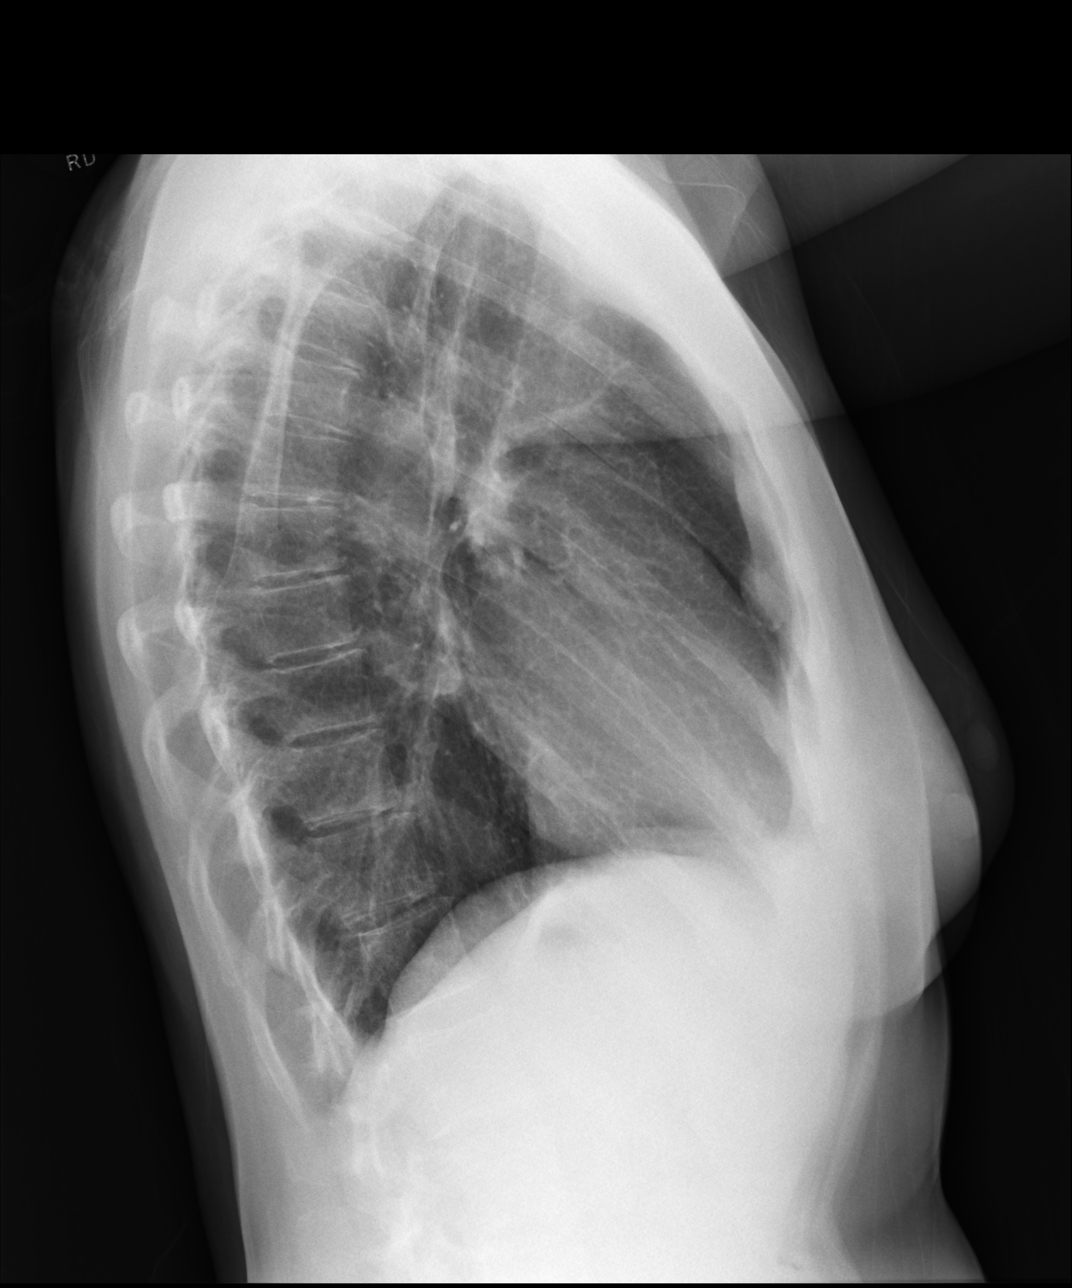

[4 of 4 positions shown; findings below may reference images not displayed]

FINDINGS: Heart size is normal. The lungs are clear of pulmonary infiltrates. 
The diaphragm and costophrenic angles are normal.
IMPRESSION: Normal 2 views of the chest.

## 2017-11-19 IMAGING — CT CT SINUS WITHOUT CONTRAST
3 series · 14 of 47 positions shown, 16 images · non-contrast
Comparison: There are no previous exams available for comparison.

CT SINUS WITHOUT CONTRAST, 11/19/2017 [DATE]: 
CLINICAL INDICATION:  Chronic maxillary sinusitis, pressure and headaches. 
A search for DICOM formatted images was conducted for prior CT imaging studies 
completed at a non-affiliated media free facility.
TECHNIQUE: The paranasal sinuses were scanned without contrast on a high 
resolution CT scanner using dose reduction techniques.  Routine MPR 
reconstructions were performed.

[Series 3: sinus st 0.75 h30s · axial · 0.35mm/px · z∈[-144,-54]mm · 8 of 152 slices shown, 10 images]
[im 11/152  brain]
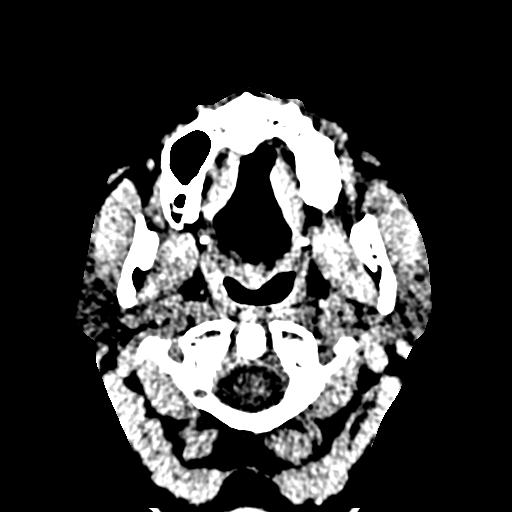
[im 11/152  bone]
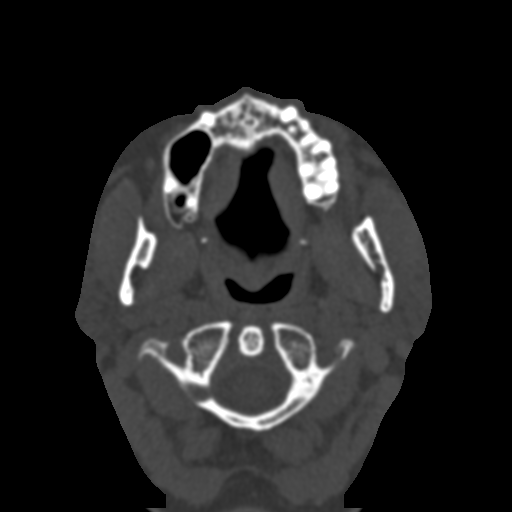
[im 32/152  bone]
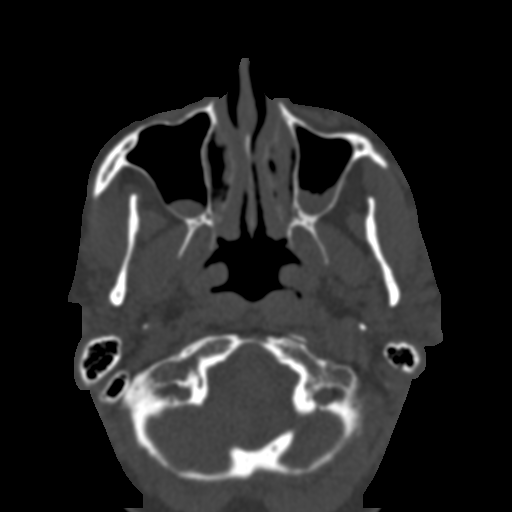
[im 47/152  bone]
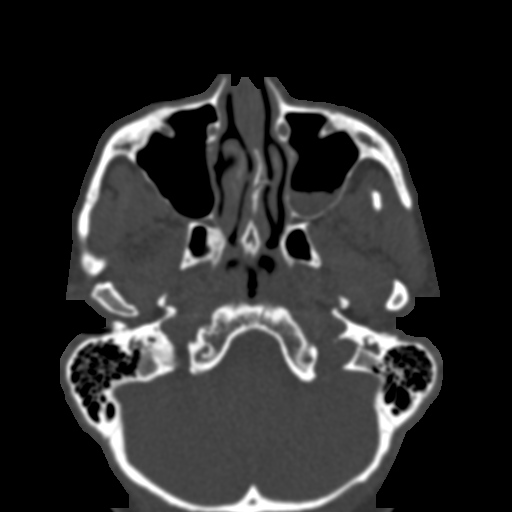
[im 68/152  bone]
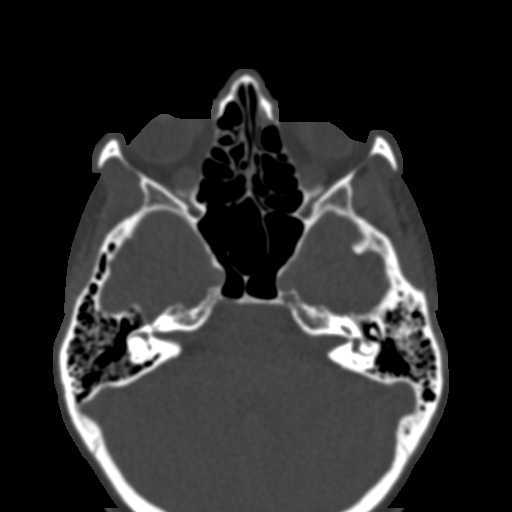
[im 84/152  brain]
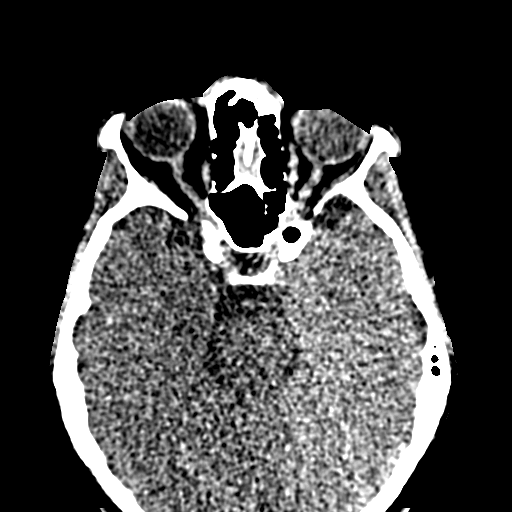
[im 84/152  bone]
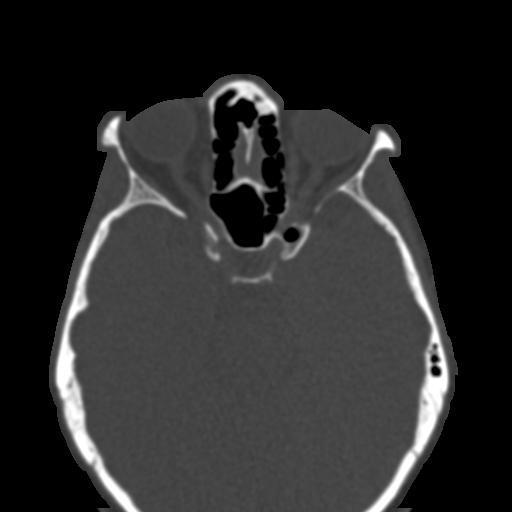
[im 105/152  bone]
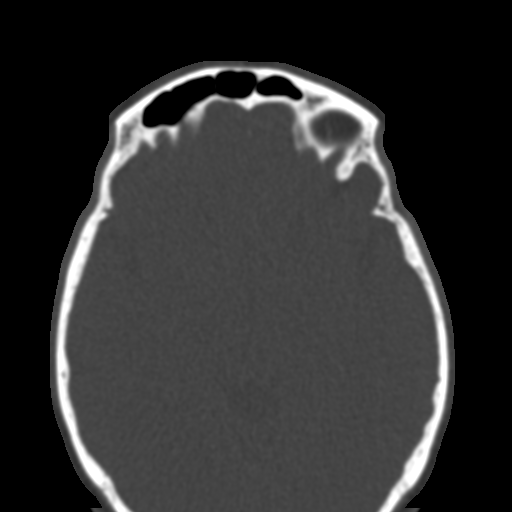
[im 120/152  bone]
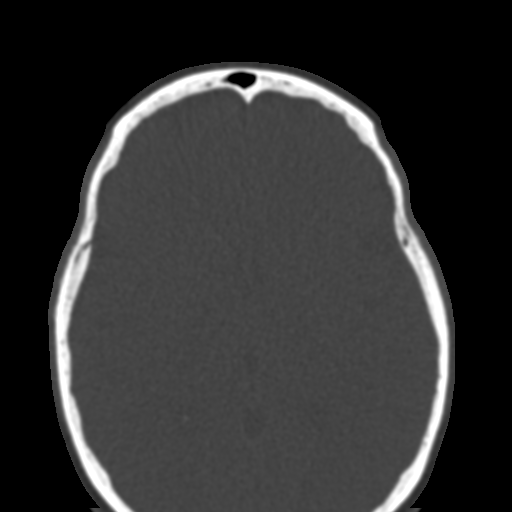
[im 141/152  bone]
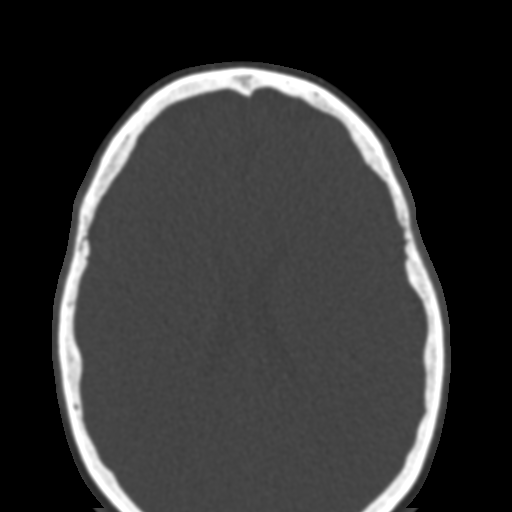

[Series 4: coronal · coronal · 0.22mm/px · 3 of 163 slices shown]
[im 55/163  bone]
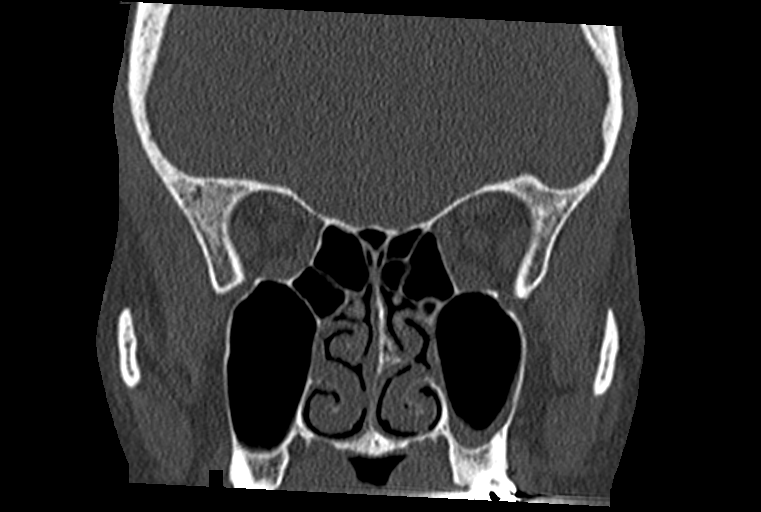
[im 73/163  bone]
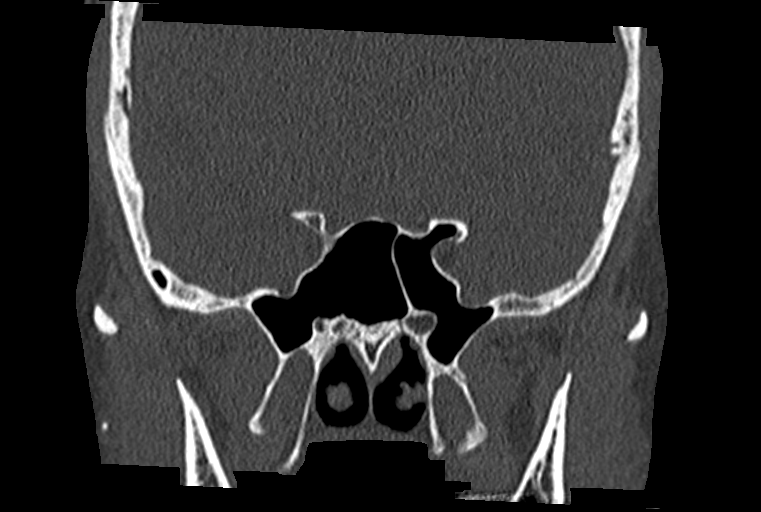
[im 91/163  bone]
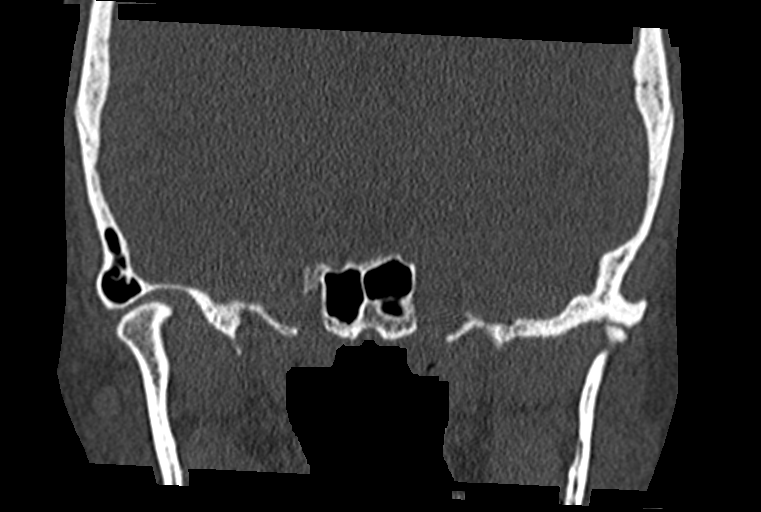

[Series 5: sag · sagittal · 0.23mm/px · 3 of 147 slices shown]
[im 49/147  bone]
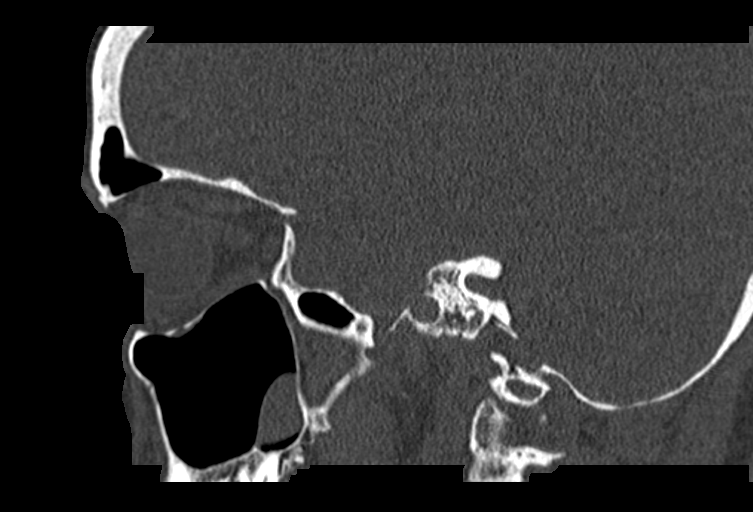
[im 74/147  bone]
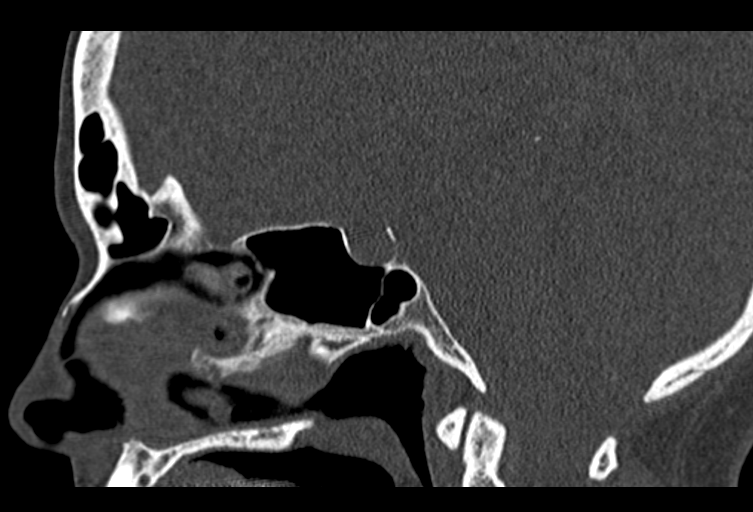
[im 98/147  bone]
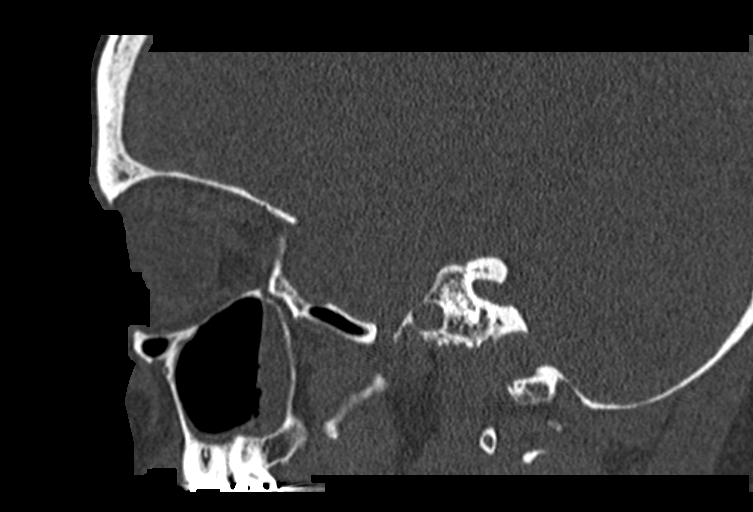

[14 of 47 positions shown; findings below may reference images not displayed]

FINDINGS: Frontal sinuses are normally developed and clear. There is minimal 
mucosal thickening in the anterior left ethmoid compartment. Otherwise ethmoid 
and sphenoid sinuses are clear. There is mild layering secretions in the left 
antrum, and there is a 12 mm retention cyst in the posterior right antrum. 
There 
is mild mucosal thickening narrowing the ostiomeatal units bilaterally. There 
is 
a partial left Haller cell, also prominent right Blade Aujla cell crowding the 
hiatus semilunaris. There is leftward septal deviation with osteocartilaginous 
shell seen on coronal image 54. There is symmetric turbinate mucosal 
hypertrophy. There is no aggressive bone destruction or discrete nasal fossa 
soft tissue mass. The floor of the anterior cranial fossa is intact. 
Otomastoid 
spaces are clear. Nasopharyngeal contours are preserved.
IMPRESSION: Mild layering secretions in the left maxillary antrum, likely indicating 
recent/active sinusitis. There is a 12 mm right maxillary retention cyst. 
Crowding of the nasal air passage due to leftward septal deviation and 
turbinate 
mucosal hypertrophy. No evidence for sinonasal neoplasm. 
RADIATION DOSE REDUCTION: All CT scans are performed using radiation dose 
reduction techniques, when applicable.  Technical factors are evaluated and 
adjusted to ensure appropriate moderation of exposure.  Automated dose 
management technology is applied to adjust the radiation doses to minimize 
exposure while achieving diagnostic quality images.

## 2017-12-23 ENCOUNTER — Other Ambulatory Visit (HOSPITAL_BASED_OUTPATIENT_CLINIC_OR_DEPARTMENT_OTHER): Payer: Self-pay | Admitting: Adult Reconstructive Orthopaedic Surgery

## 2017-12-23 DIAGNOSIS — Z96651 Presence of right artificial knee joint: Secondary | ICD-10-CM

## 2017-12-28 ENCOUNTER — Other Ambulatory Visit (INDEPENDENT_AMBULATORY_CARE_PROVIDER_SITE_OTHER): Payer: Self-pay | Admitting: Family

## 2017-12-29 ENCOUNTER — Other Ambulatory Visit (INDEPENDENT_AMBULATORY_CARE_PROVIDER_SITE_OTHER): Payer: Self-pay | Admitting: Family

## 2018-01-01 ENCOUNTER — Encounter (HOSPITAL_BASED_OUTPATIENT_CLINIC_OR_DEPARTMENT_OTHER): Payer: Self-pay | Admitting: Adult Reconstructive Orthopaedic Surgery

## 2018-02-09 ENCOUNTER — Other Ambulatory Visit (INDEPENDENT_AMBULATORY_CARE_PROVIDER_SITE_OTHER): Payer: Self-pay | Admitting: Family

## 2018-04-13 ENCOUNTER — Other Ambulatory Visit (INDEPENDENT_AMBULATORY_CARE_PROVIDER_SITE_OTHER): Payer: Self-pay | Admitting: Family

## 2018-07-15 ENCOUNTER — Other Ambulatory Visit (INDEPENDENT_AMBULATORY_CARE_PROVIDER_SITE_OTHER): Payer: Self-pay | Admitting: Family

## 2018-10-12 IMAGING — DX CHEST PA AND LATERAL
1 series · 2 of 2 positions shown · non-contrast
Comparison: Chest x-ray dated 08/26/2017

CLINICAL INDICATION: Cough for 2 to 3 weeks

[Series 1: PA · U · 0.14mm/px · 2 of 2 slices shown]
[im 1/2]
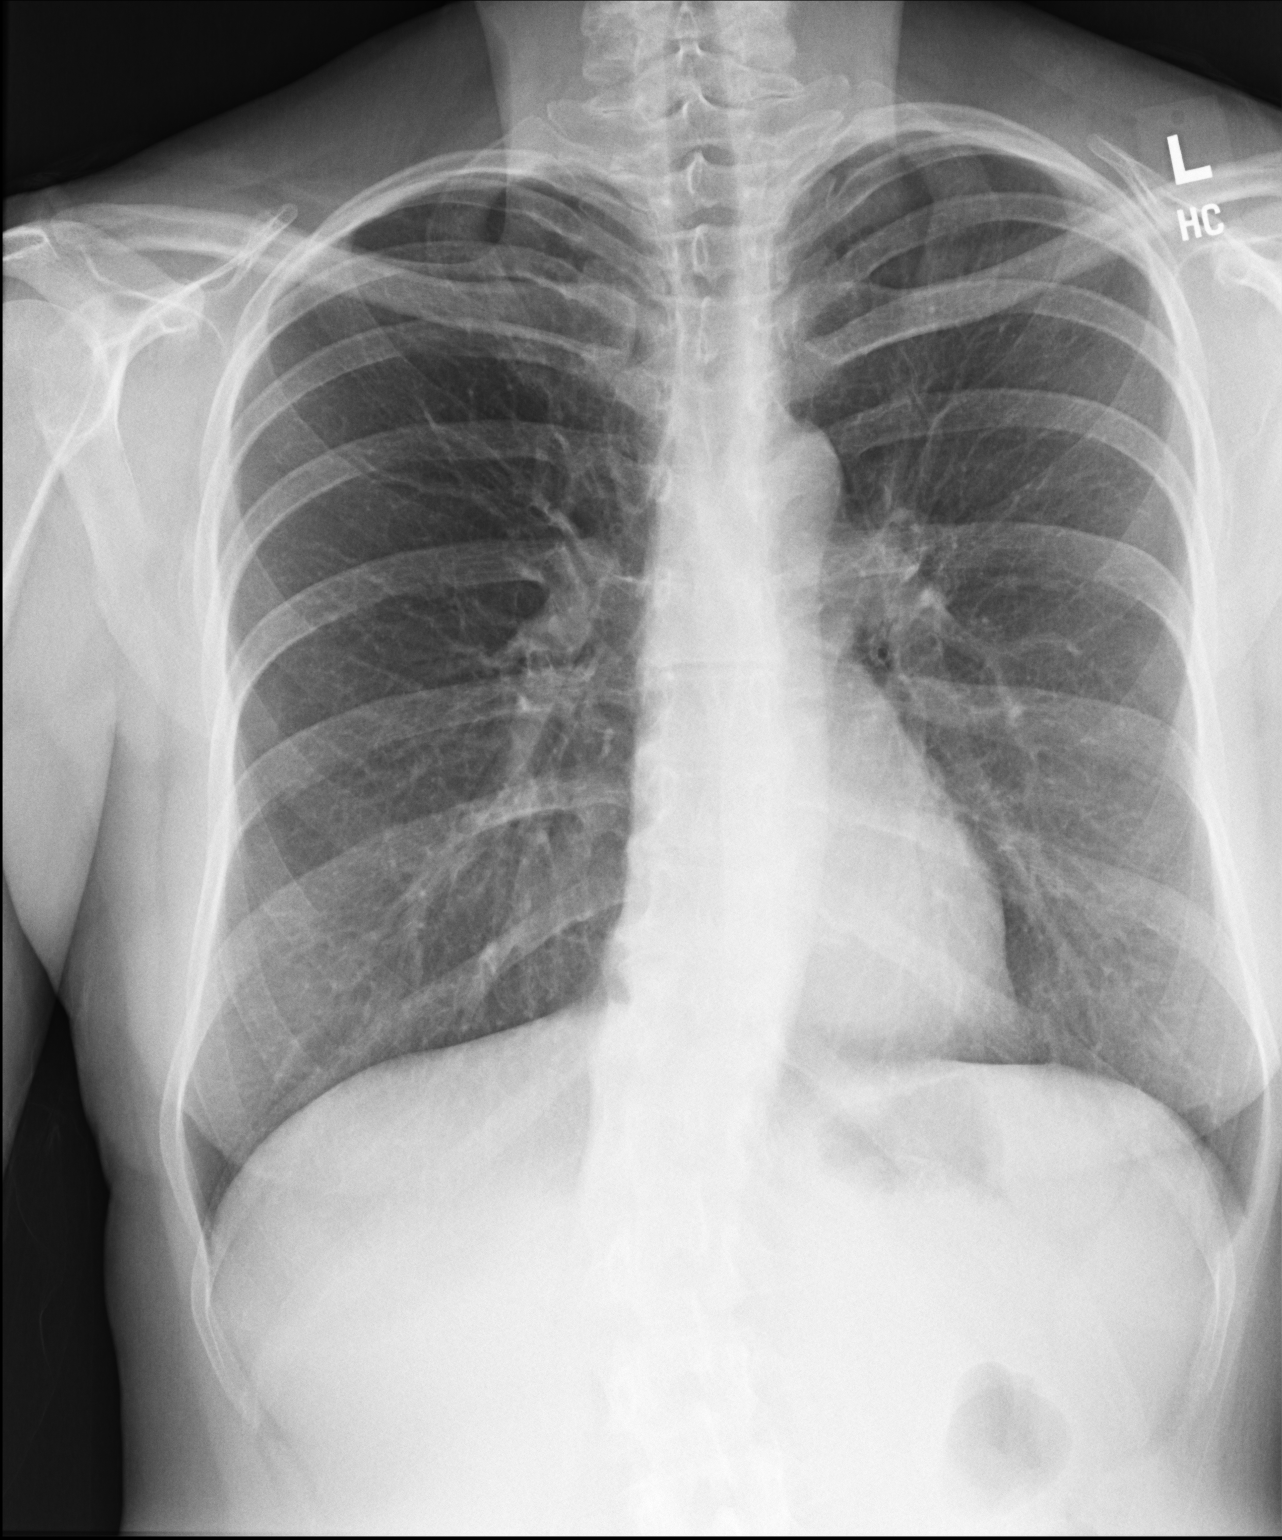
[im 2/2]
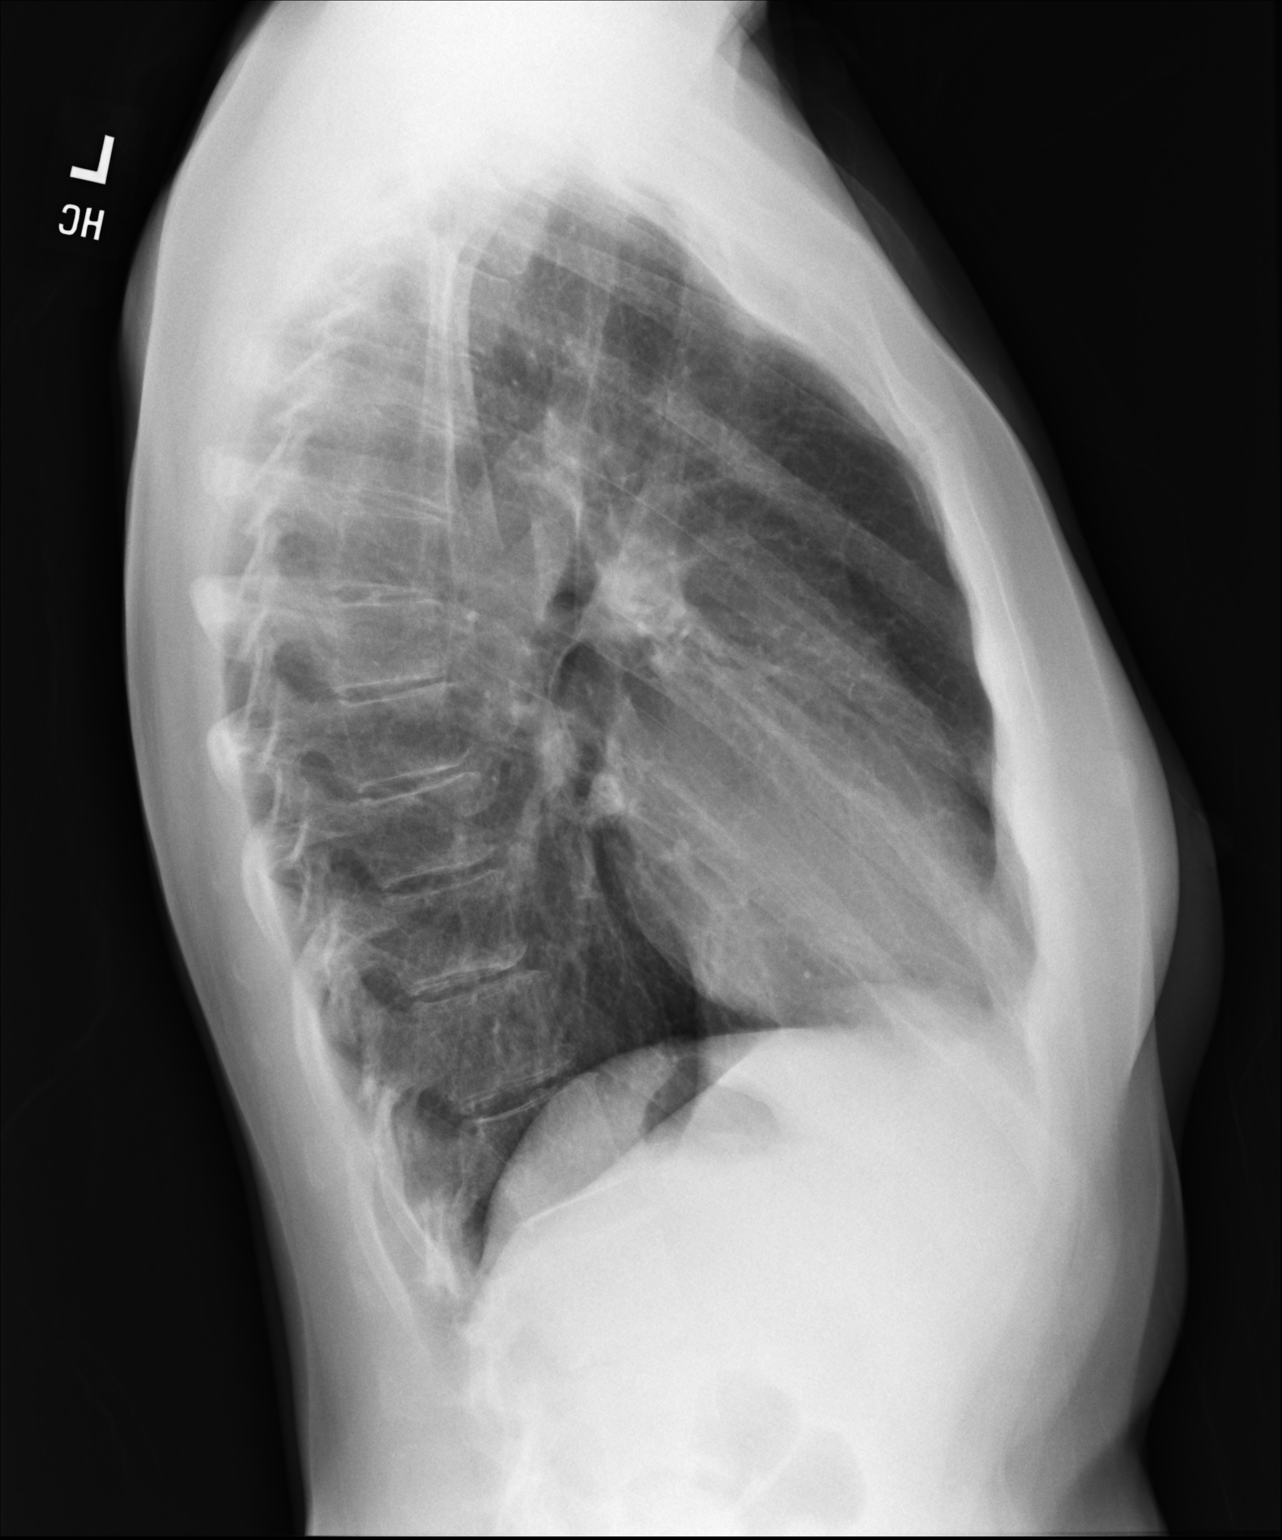

[2 of 2 positions shown; findings below may reference images not displayed]

FINDINGS: Medical devices: None 
The cardiomediastinal silhouette appears within normal limits. 
The lungs are normally expanded and well aerated without focal consolidation. No 
pneumothorax or pleural effusion. 
Osseous structures demonstrate no acute abnormality or aggressive appearing 
osseous lesion.
IMPRESSION: No acute cardiopulmonary process.

## 2019-06-19 ENCOUNTER — Other Ambulatory Visit: Payer: Self-pay

## 2019-09-28 IMAGING — CT CT ABDOMEN PELVIS W/ UROGRAM
2 of 5 series · 14 of 46 positions shown, 16 images · IV contrast (APPLIED)
Comparison: Renal sonogram 09/10/2019.

CT ABDOMEN PELVIS W/ UROGRAM, 09/28/2019 [DATE]: 
CLINICAL INDICATION: Follow-up of possible LEFT renal mass on recent sonogram 
performed to evaluate LEFT flank pain. 
A search for DICOM formatted images was conducted for prior CT imaging studies 
completed at a non-affiliated media free facility.
TECHNIQUE: The abdomen and pelvis were scanned from lung bases through the 
pubic rami without and with 922cc's of Isovue 300 contrast injected 
intravenously on a high-resolution CT scanner using dose reduction techniques. 
Routine MPR and MIP 3D renderings were reconstructed on an independent 
workstation with concurrent physician supervision.

[Series 5: axial portal · axial · portal-venous · 0.81mm/px · z∈[-427,-55]mm · 11 of 150 slices shown, 13 images]
[im 13/150  soft-tissue]
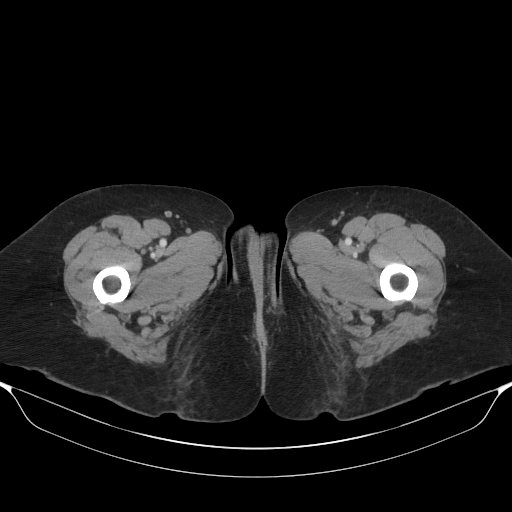
[im 13/150  bone]
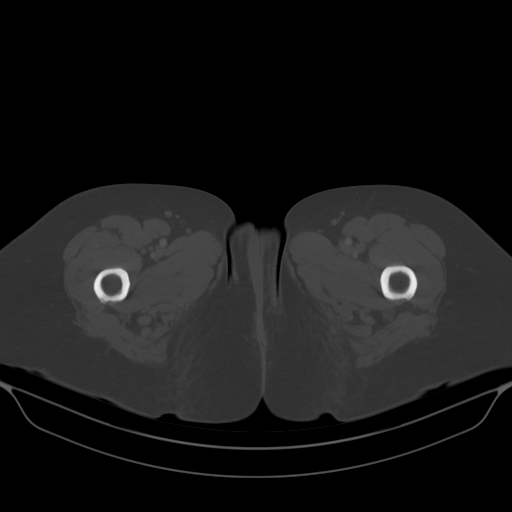
[im 25/150  soft-tissue]
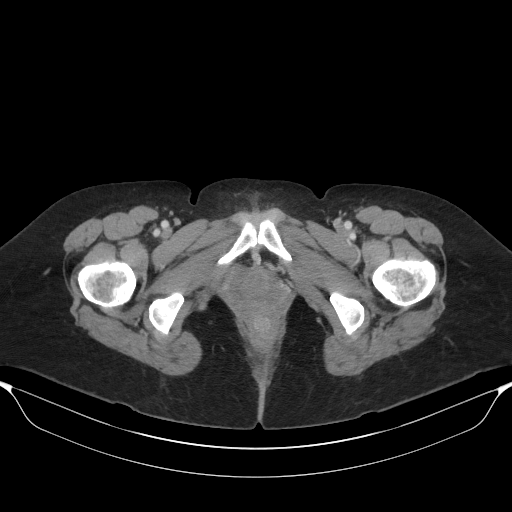
[im 38/150  soft-tissue]
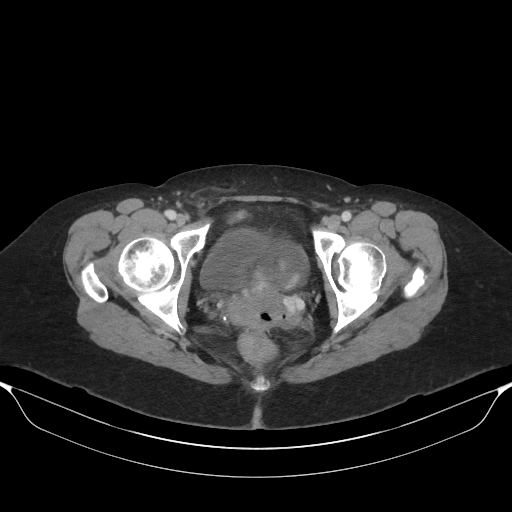
[im 50/150  soft-tissue]
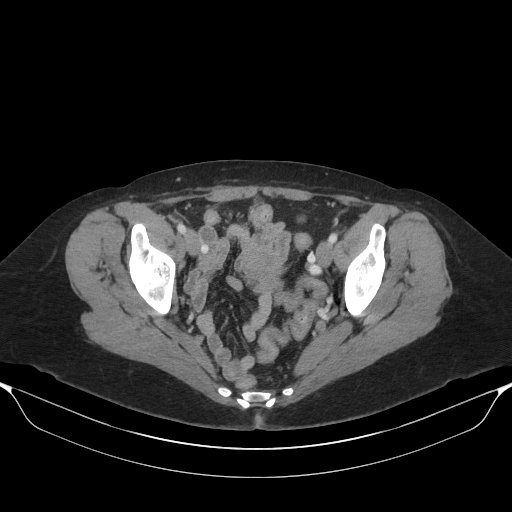
[im 63/150  soft-tissue]
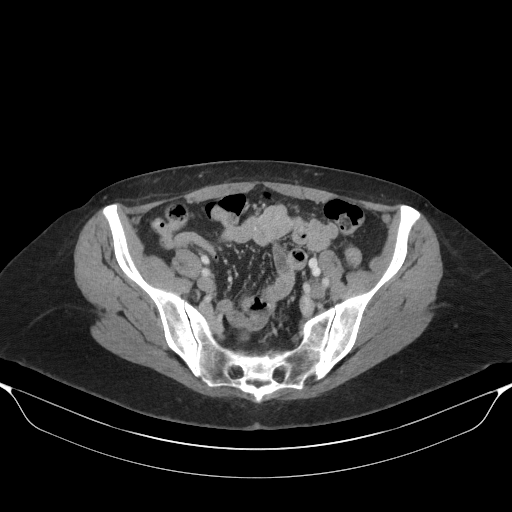
[im 75/150  soft-tissue]
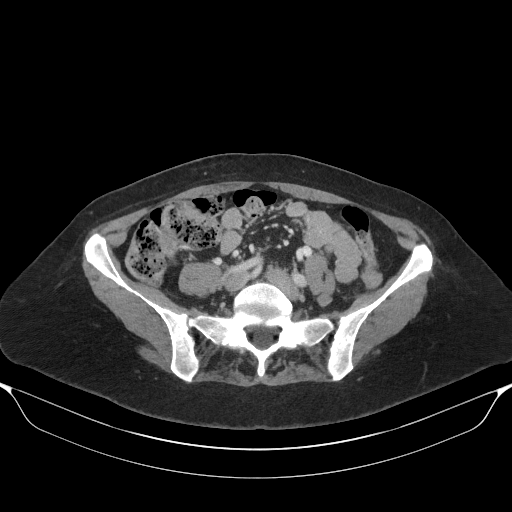
[im 87/150  soft-tissue]
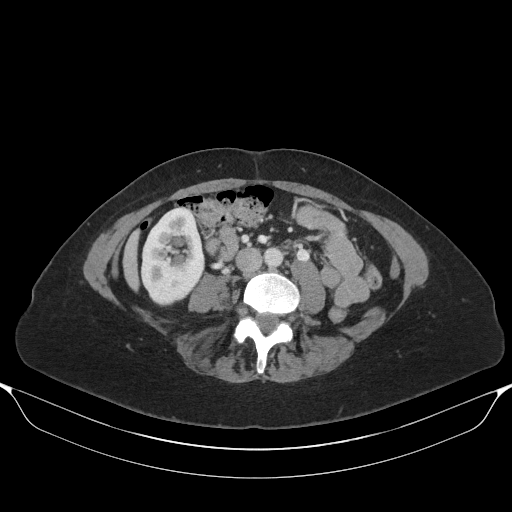
[im 100/150  soft-tissue]
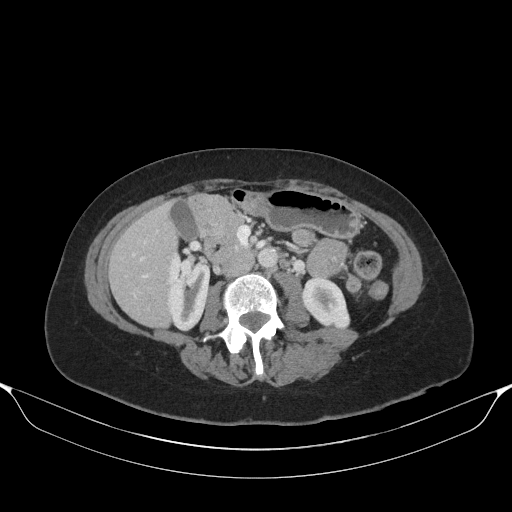
[im 112/150  soft-tissue]
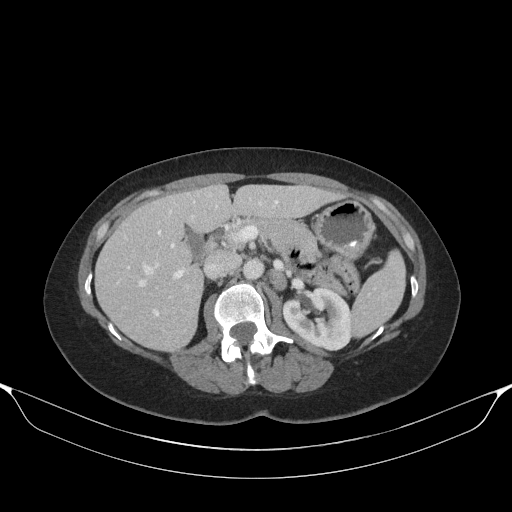
[im 112/150  bone]
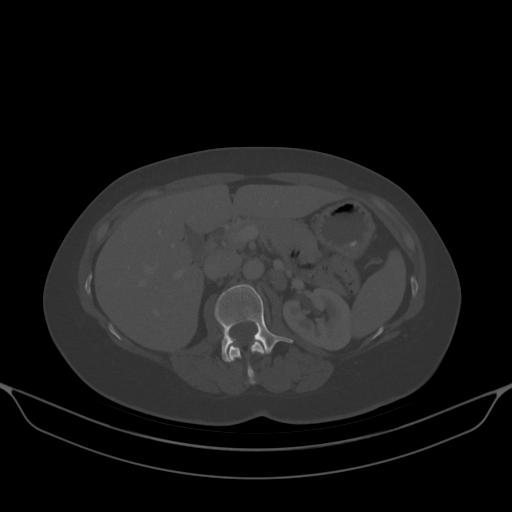
[im 125/150  soft-tissue]
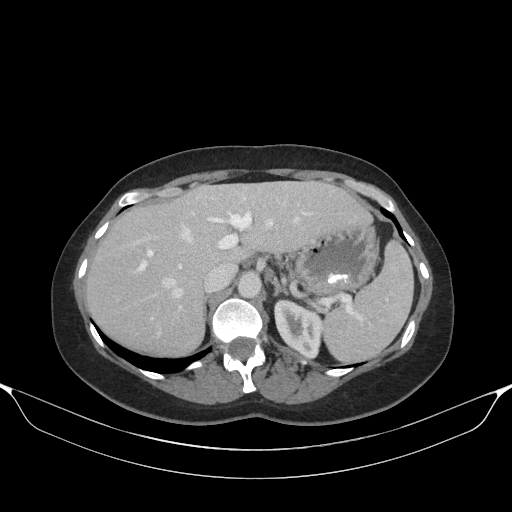
[im 137/150  soft-tissue]
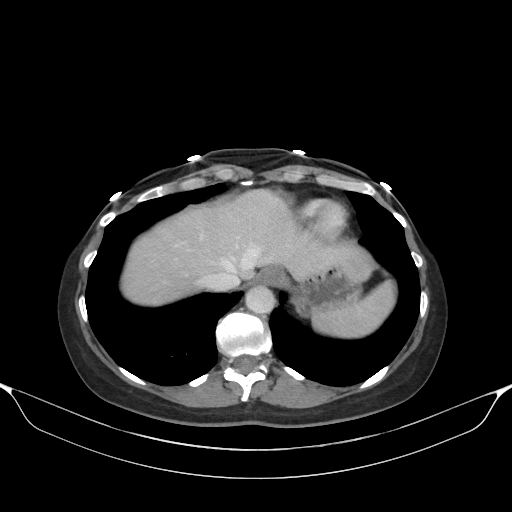

[Series 9: cor delay · coronal · delayed · 0.89mm/px · 3 of 112 slices shown]
[im 38/112  soft-tissue]
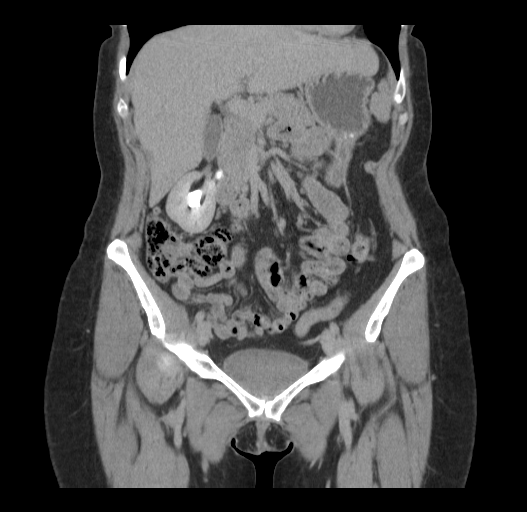
[im 50/112  soft-tissue]
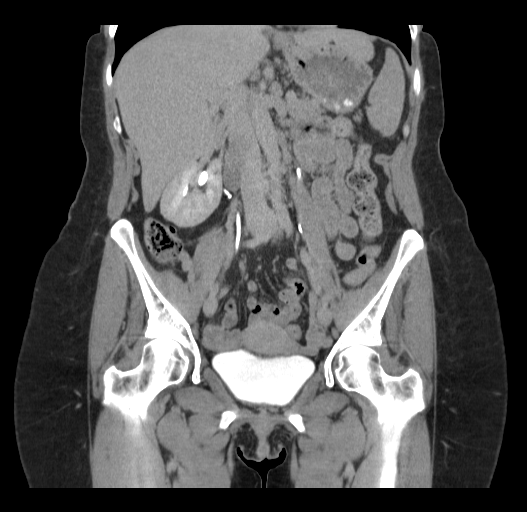
[im 62/112  soft-tissue]
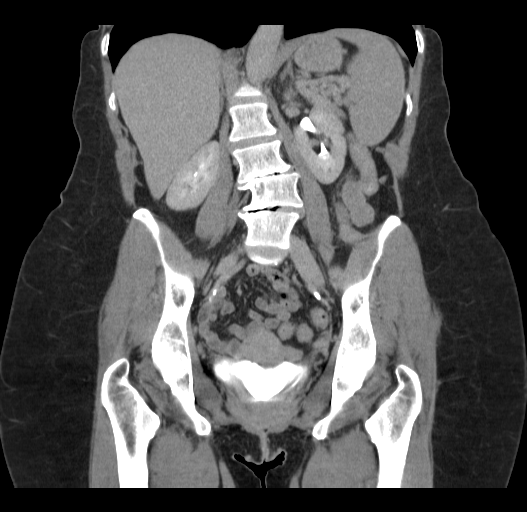

[14 of 46 positions shown; findings below may reference images not displayed]

FINDINGS: Renal parenchymal volume is within normal limits bilaterally and enhancement is 
symmetric. Renal veins are patent. No focal pathology of the renal cortex is 
found in either side. There is a 2 mm nonobstructing calculus in one of the 
lower pole calyces of the LEFT kidney. No RIGHT nephrolithiasis is found. There 
is mild pelvocaliectasis in the RIGHT RIGHT kidney which also exhibits ptosis. 
There is a rather abrupt transition between the RIGHT renal pelvis and proximal 
ureter suggesting that there is a mild low-grade partial ureteropelvic junction 
obstruction accounting for the mild pelvocaliectasis. No suspicious urothelial 
lesions are detected in either kidney. Both ureters appear normal. The urinary 
bladder appears normal. No potential LEFT renal mass is found. There is no CT 
correlate to what was present on the sonogram which suggests that the 
sonographic abnormality is not a true finding. 
The liver is normal. The gallbladder and biliary tract are unremarkable. No 
concerning pancreatic abnormality is detected. The spleen appears normal. 
Splenic vein, portal vein and superior mesenteric vein are patent. Hepatic veins 
are patent. Both of the adrenal glands appear normal. There appears to be a 
small diverticulum projecting dorsally from the gastric cardia. This is 
incidental. The stomach is otherwise unremarkable in CT appearance. No 
concerning colonic pathology is found. No small bowel abnormalities are 
detected. Inferior vena cava is normal in caliber. Abdominal aorta is within 
normal limits in diameter. Uterus is normal in size for patient age. 
Large bilateral adnexal venous varicosities are present greater on the LEFT 
side. There is diffuse dilatation of the LEFT ovarian vein. There is 
constriction of the central LEFT renal vein where it passes between the aorta 
posteriorly and the superior mesentery artery anteriorly which can produce 
increased pressure in the LEFT renal vein and retrograde flow down the LEFT 
ovarian veins creating bilateral adnexal venous varicosities which subsequently 
drain superiorly up the RIGHT gonadal vein. This is not necessarily a 
symptomatic observation however the presence of bilateral adnexal varices can 
produce pelvic pain (in which case it is referred to as the nutcracker 
syndrome). 
No abnormal abdominal or pelvic lymph nodes are found and there is no free 
intraperitoneal fluid. No significant abdominal wall hernias are identified. 
Subcutaneous gas bubbles are in the RIGHT lower quadrant suggesting that this 
patient has been receiving subcutaneous pharmaceutical injections. The lumbar 
spine exhibits moderate levoscoliotic curvature. There is severe disc 
degeneration at L3-4 and L4-5. Limited imaging of the inferior thorax shows no 
significant incidental pathology.
IMPRESSION: 1. No mass or other pathology is found in the LEFT kidney to correlate with the 
potential sonographic abnormality suggesting that the sonographic abnormality is 
not indicative of true pathology. A tiny nonobstructing LEFT renal calculus is 
present. Left kidney is otherwise unremarkable. 
2. There is suggestion of potential chronic low-grade partial UPJ obstruction of 
the RIGHT kidney as discussed above. 
3. Prominent adnexal venous varices are present as described above in detail. 
These are usually an incidental finding but can produce pelvic pain. 
RADIATION DOSE REDUCTION: All CT scans are performed using radiation dose 
reduction techniques, when applicable.  Technical factors are evaluated and 
adjusted to ensure appropriate moderation of exposure.  Automated dose 
management technology is applied to adjust the radiation doses to minimize 
exposure while achieving diagnostic quality images.

## 2019-12-02 IMAGING — NM RENAL SCAN WITH LASIX
4 series · 4 of 4 positions shown · non-contrast
Comparison: CT scan 09/28/2019

RENAL SCAN WITH LASIX, 12/02/2019 [DATE]: 
CLINICAL INDICATION: History of left kidney stone.

[Series 112: bladder · 2.26mm/px · 1 of 1 slices shown]
[im 1/1]
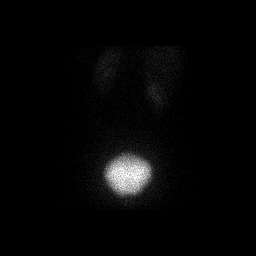

[Series 114: post · 2.26mm/px · 1 of 1 slices shown]
[im 1/1]
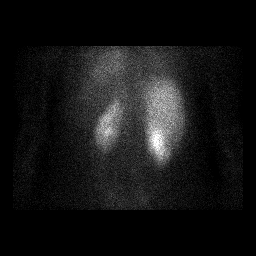

[Series 115: rpo · 2.26mm/px · 1 of 1 slices shown]
[im 1/1]
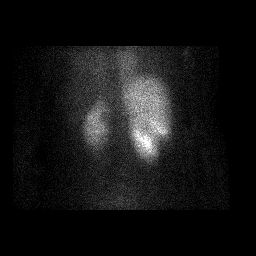

[Series 116: lpo · 2.26mm/px · 1 of 1 slices shown]
[im 1/1]
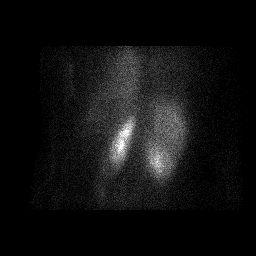

[4 of 4 positions shown; findings below may reference images not displayed]

FINDINGS: After administration of 9.9 mCi of technetium 99 M MAG3 images are obtained in 
the posterior projection of the kidneys. 40 mg of Lasix was administered at the 
10 minute mark. 
There is rapid uptake and excretion by the kidneys bilaterally. The time 
activity curves are within normal limits bilaterally. No obstructive changes 
seen. No findings suggest right ureteropelvic junction stenosis as was 
questioned on prior CT scan. The percent uptake is 59% on the left compared to 
41% on the right. Time to peak is 2.42 minutes on the left and 3.92 minutes on 
the right. T1/2 from peak is 5.37 minutes on the left and 6.42 minutes on the 
right.
IMPRESSION: No obstructive changes seen.

## 2019-12-02 IMAGING — DX CHEST PA AND LATERAL
1 series · 2 of 2 positions shown · non-contrast
Comparison: none

CLINICAL INDICATION:  Common variable immunno deficiency. No current complaints. 
COMPARISON EXAMINATIONS: 10/12/2018.

[Series 1: PA · U · 0.14mm/px · 2 of 2 slices shown]
[im 1/2]
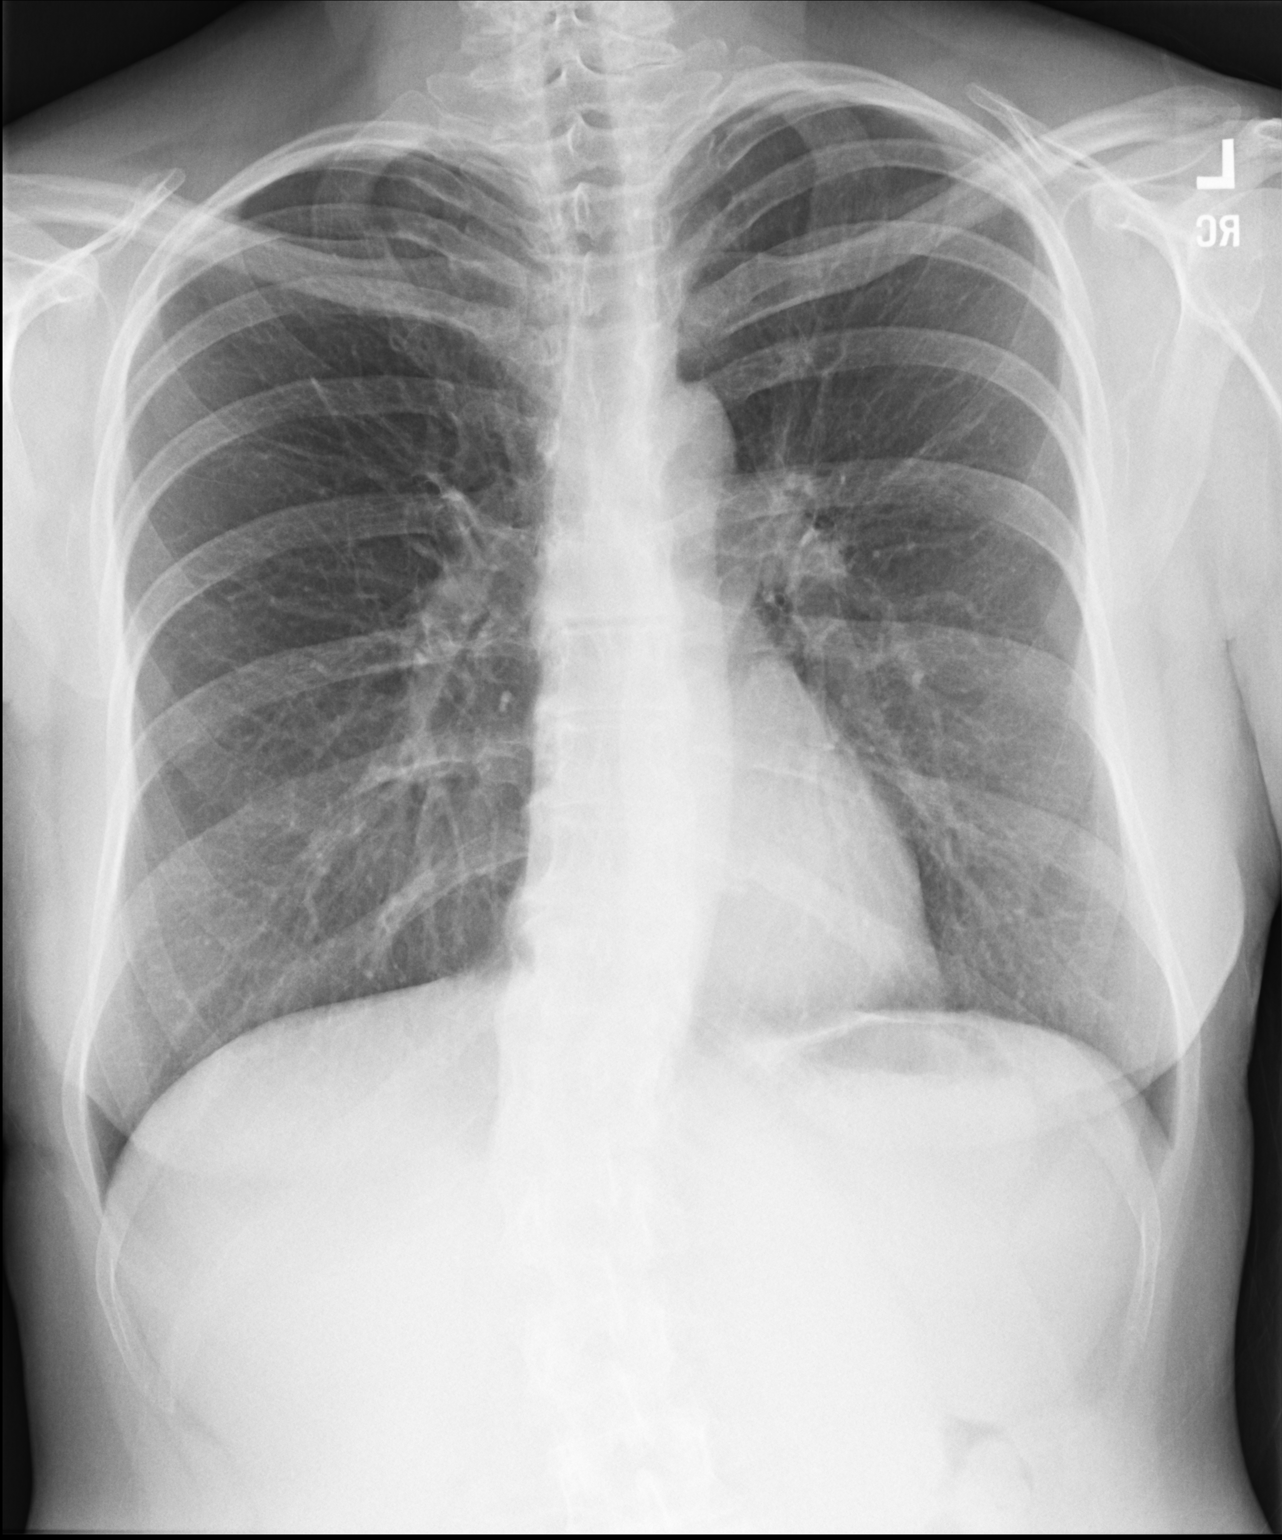
[im 2/2]
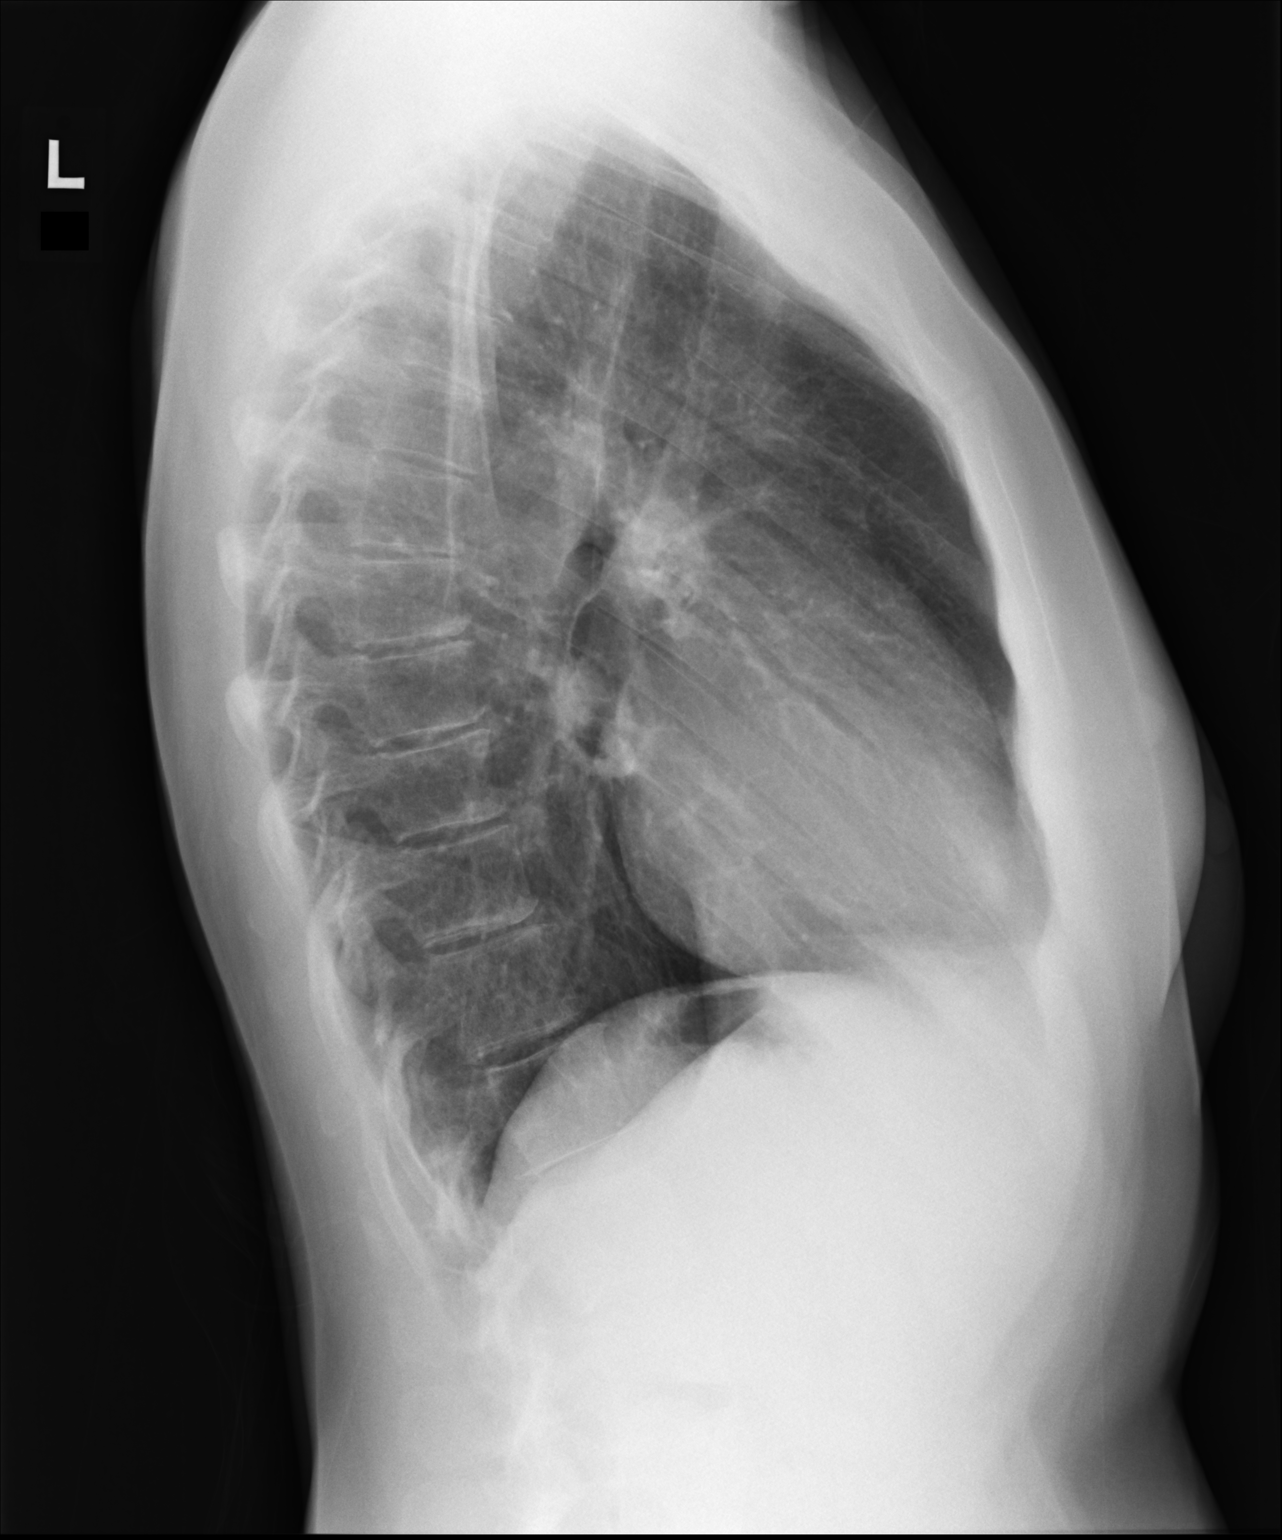

[2 of 2 positions shown; findings below may reference images not displayed]

FINDINGS: The lungs are clear. No focal consolidation. No effusion. No 
pneumothorax. Normal cardiomediastinal silhouette. No acute osseous abnormality. 
Stable chest radiographic appearance.
IMPRESSION: No acute cardiopulmonary findings.

## 2020-01-10 ENCOUNTER — Encounter (HOSPITAL_BASED_OUTPATIENT_CLINIC_OR_DEPARTMENT_OTHER): Payer: Self-pay | Admitting: Medical

## 2020-02-08 IMAGING — MR MRI BREAST BILATERAL W/WO CONTRAST
4 of 12 series · 9 of 48 positions shown · IV contrast (gadolinium)
Comparison: Incomplete diagnostic mammography images dated 01/14/2020.

MRI BREAST BILATERAL W/WO CONTRAST, 02/08/2020 [DATE]: 
CLINICAL INDICATION:  Abnormal imaging from an outside facility, bilateral 
axillary tenderness/pain history of negative right breast biopsy 9-10 years 
prior. Recent outside imaging described mammographically stable appearing 
nodular asymmetries in the left breast. Ultrasound revealed multiple hypoechoic 
masses appearing solid. All of the masses described on prior ultrasound are 
subcentimeter in size.
TECHNIQUE: Multiple sequences were obtained in various planes with both before 
and after the intravenous administration of gadolinium. In addition to the 
routine images, three-dimensional renderings were performed on an independent 
workstation, time activity curves generated over areas of enhancement and 
computer-aided detection utilized. 6.8 cc cc of gadavist was injected 
intravenously with the injector at a rate of 1.5 cc per second.

[Series 201: survey · axial · 7.0mm · 1.34mm/px · 1 of 25 slices shown]
[im 1/25]
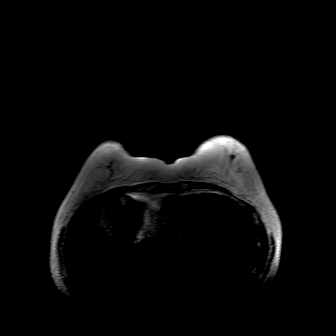

[Series 301: t1w_ffe_3d · axial · 1.6mm · 0.49mm/px · z∈[-88,+91]mm · 3 of 225 slices shown]
[im 1/225]
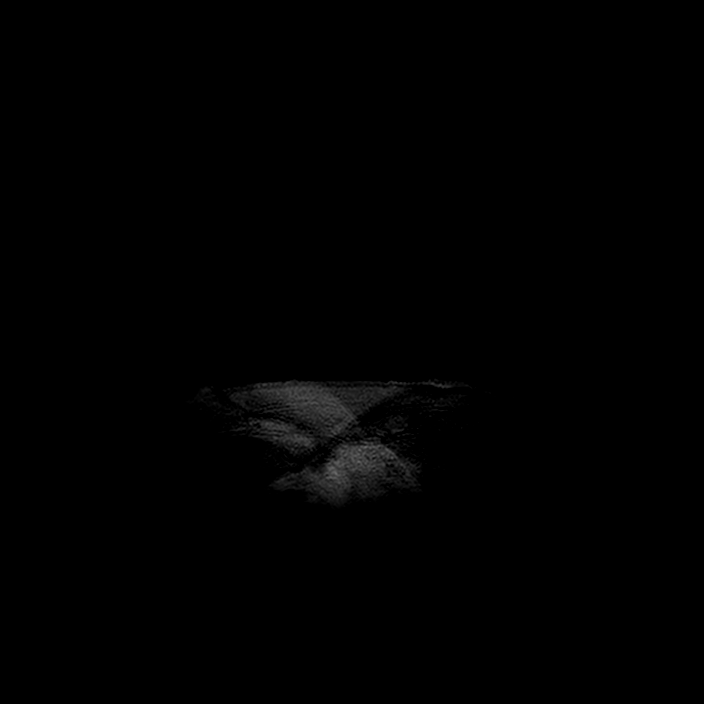
[im 150/225]
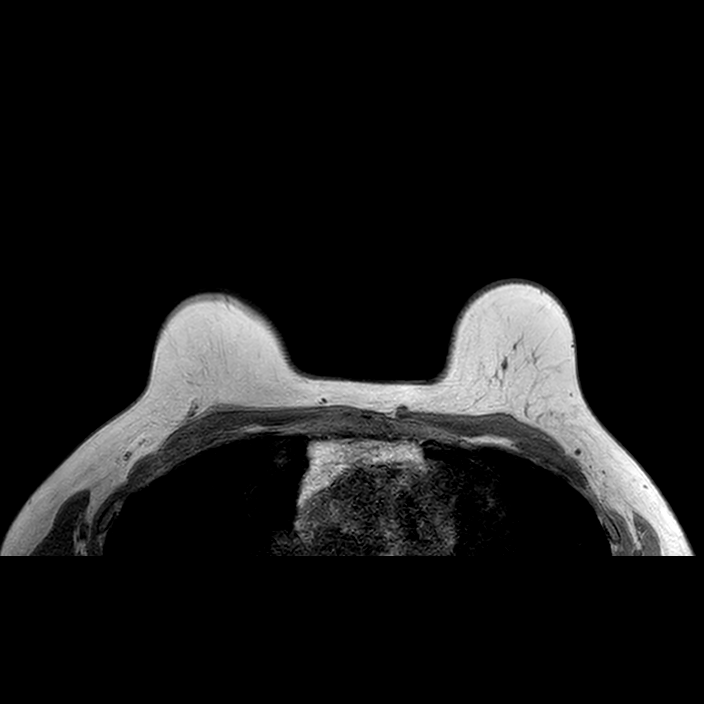
[im 225/225]
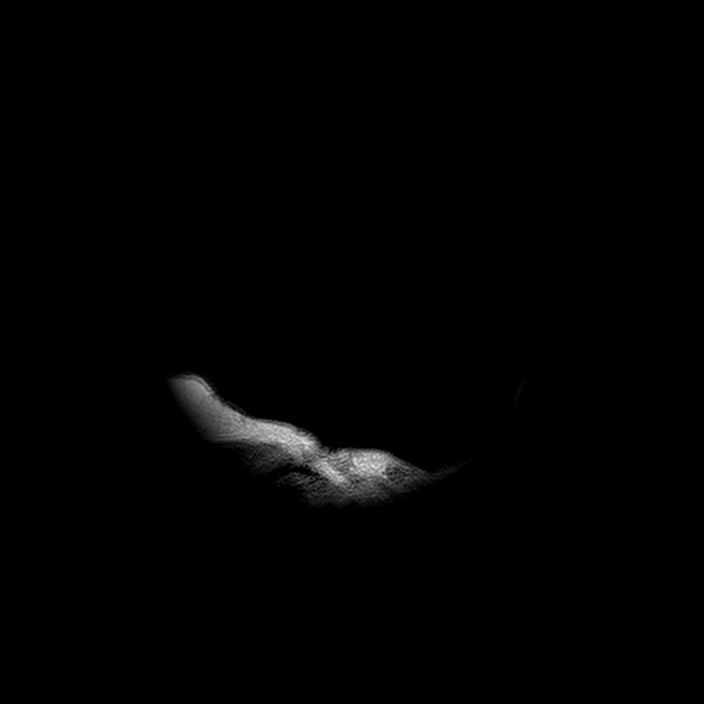

[Series 401: t2w_(id) · axial · 2.5mm · 0.54mm/px · z∈[-87,+90]mm · 2 of 72 slices shown]
[im 1/72]
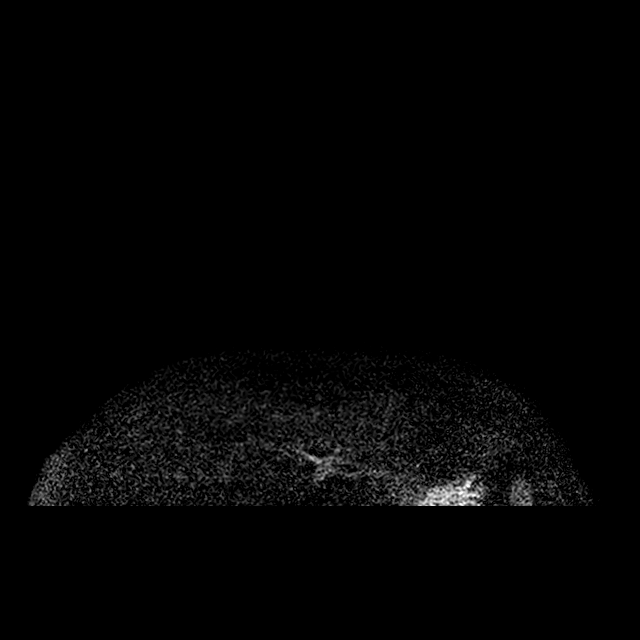
[im 72/72]
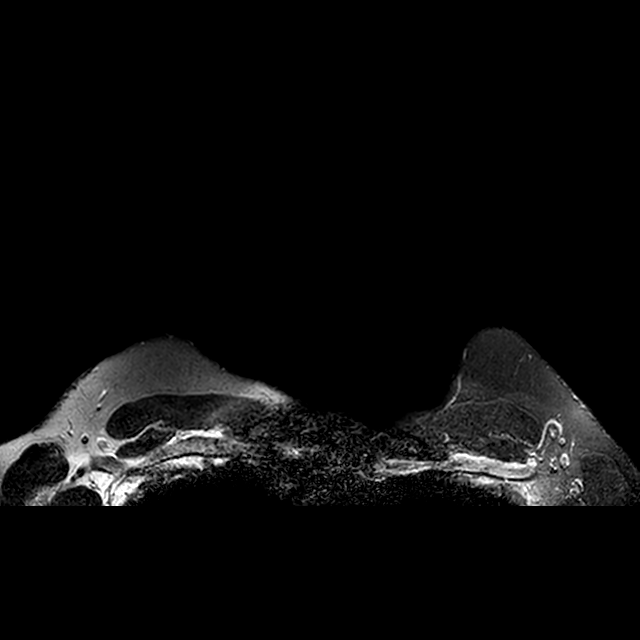

[Series 502: ssub dyn 1 · axial · 1.6mm · 0.39mm/px · z∈[-88,+91]mm · 3 of 225 slices shown]
[im 1/225]
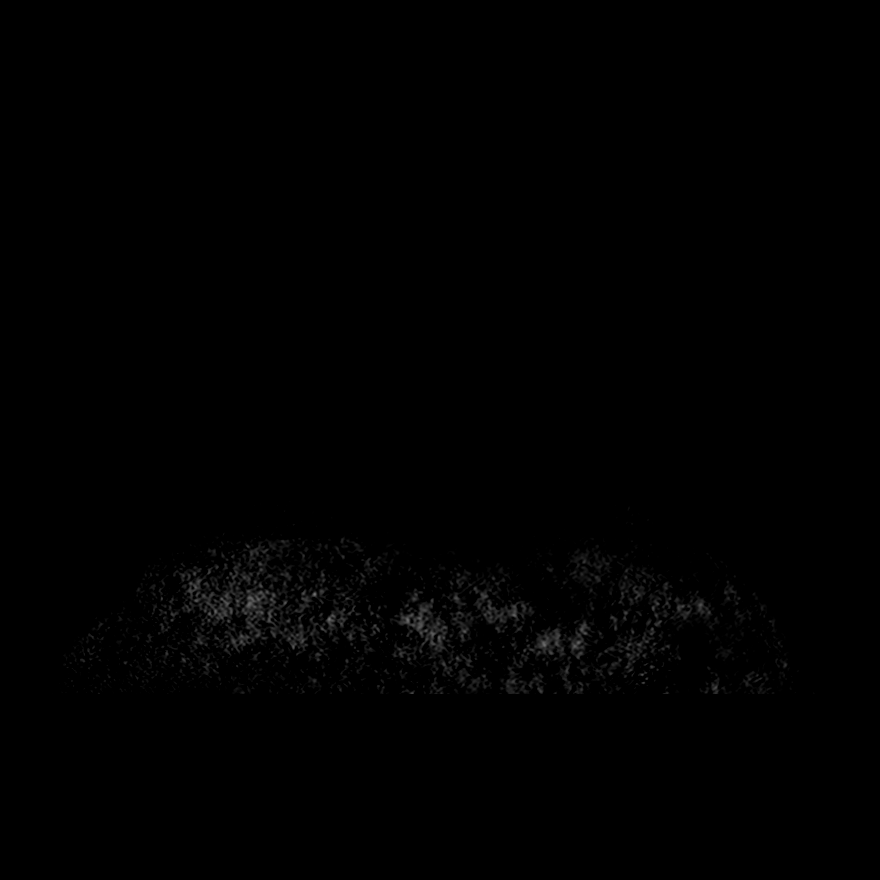
[im 113/225]
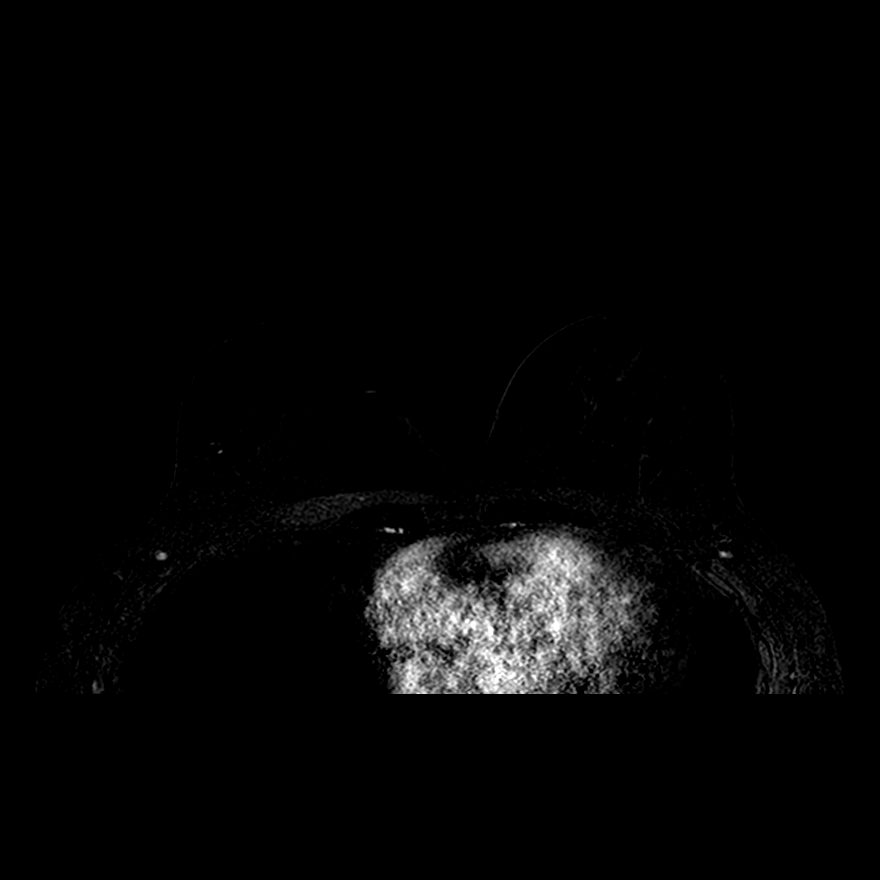
[im 225/225]
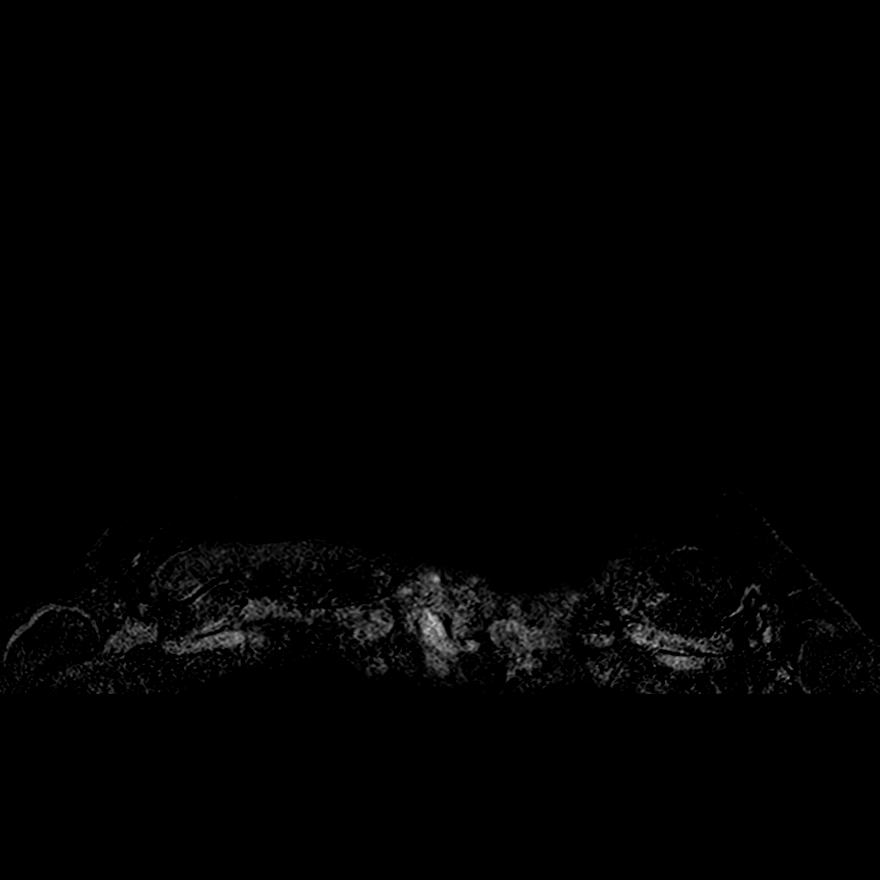

[9 of 48 positions shown; findings below may reference images not displayed]

Please 
note the tomosynthesis images from diagnostic mammogram dated 01/14/2020 were not 
available. Sonography images dated 01/14/2020. No historical mammography is 
available for comparison. Diagnostic examinations from 01/14/2020 described 
priors available for comparison dating back to 1169. No prior breast MRI is 
available for comparison.
FINDINGS: There is mild background parenchymal enhancement. 
RIGHT: There are numerous cystic lesions throughout the right breast. The 
majority of these are very bright in T2 signal and are most consistent with 
simple cyst. Some of these demonstrate minimally increased T1 signal suggesting 
proteinaceous or hemorrhagic internal debris. A few of these cysts demonstrate 
thin peripheral enhancement consistent with pericystic inflammatory change 
(image 76 of series 507) No suspicious mass or area of enhancement within the 
right breast. The right nipple areolar complex appears normal. No right sided 
internal mammary or axillary lymphadenopathy. Focal susceptibility artifact in 
the superior right breast consistent with post procedure clip. 
LEFT: There are numerous cystic lesions throughout the left breast. The majority 
of these are very bright in T2 signal and most consistent with simple cyst. A 
few demonstrate slightly increased T1 signal suggesting internal proteinaceous 
versus hemorrhagic debris. . There are a few demonstrating thin peripheral 
smooth-type enhancement consistent with pericystic inflammatory change No 
suspicious mass or area of abnormal enhancement in the left breast. The left 
nipple areolar complex appears normal. No left internal mammary or axillary 
lymphadenopathy. 
The multiple subcentimeter breast "masses" described on outside imaging dated 
01/14/2020 appear to correspond to the cystic lesions described above. There is 
no enhancing solid mass throughout the left breast. 
OTHER: None
IMPRESSION: 1.  No MRI evidence of malignancy in either breast. 
2.  Recently described "masses" involving the superior left breast appear to 
correspond to a combination of simple and complicated cystic lesions. There is 
no suspicious enhancing lesion in the left breast. 
3.  Recommend patient return to annual screening mammography due in December 2020. 
( BI-RADS 2) Benign findings. Routine mammographic follow-up is recommended.

## 2020-02-10 IMAGING — CT CT NECK WITH CONTRAST
4 of 5 series · 13 of 33 positions shown, 15 images · IV contrast (APPLIED)
Comparison: There are no previous exams available for comparison.

CT NECK WITH CONTRAST, 02/10/2020 [DATE]: 
CLINICAL INDICATION:  Acute pharyngitis 
A search for DICOM formatted images was conducted for prior CT imaging studies 
completed at a non-affiliated media free facility.
TECHNIQUE: The neck was scanned from the level of the maxillary sinuses through 
the AP Window with 100 cc's of Isovue 300 injected intravenously on a 
high-resolution CT scanner using dose reduction techniques.  Routine MPR 
reconstructions were performed.

[Series 2: axial · axial · 0.44mm/px · z∈[-256,-106]mm · 4 of 127 slices shown, 5 images]
[im 26/127  soft-tissue]
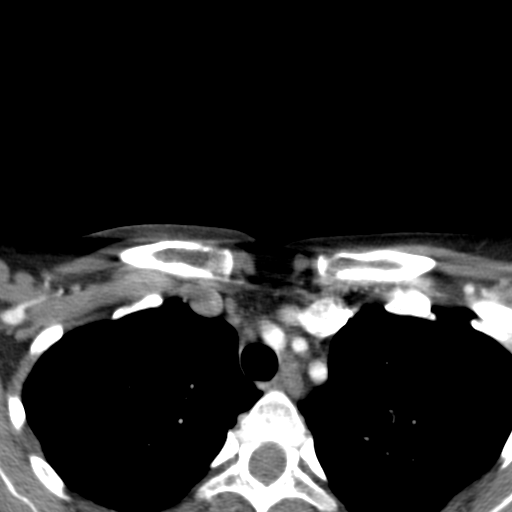
[im 26/127  bone]
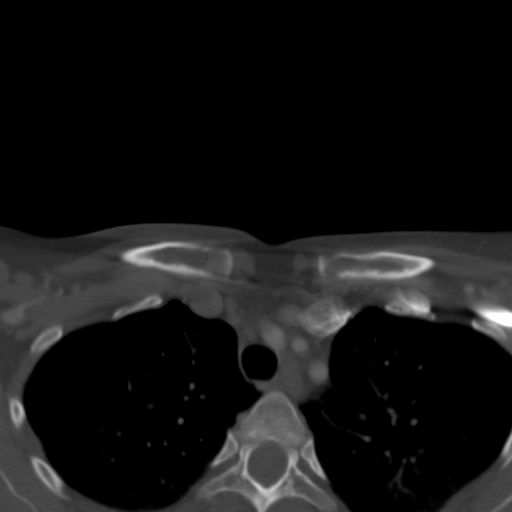
[im 51/127  bone]
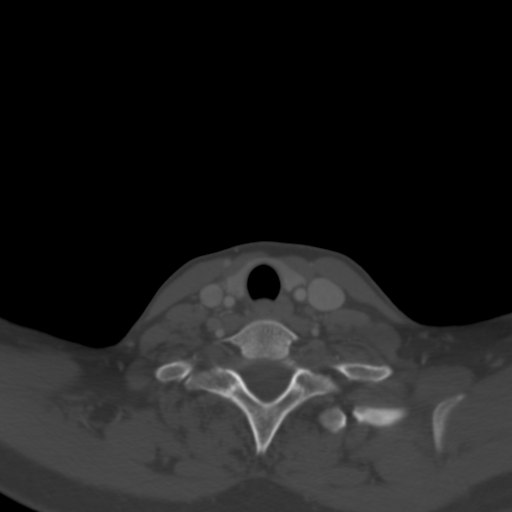
[im 76/127  bone]
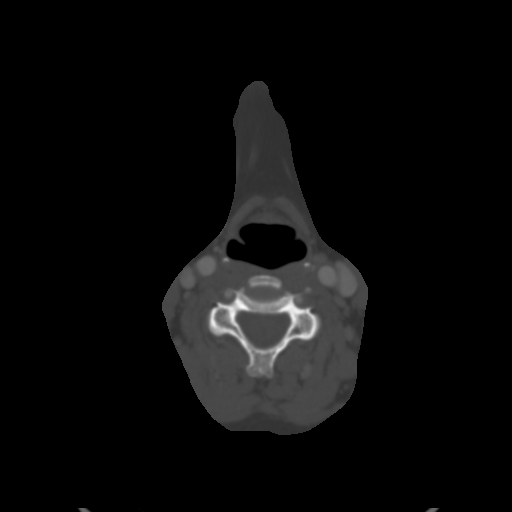
[im 101/127  bone]
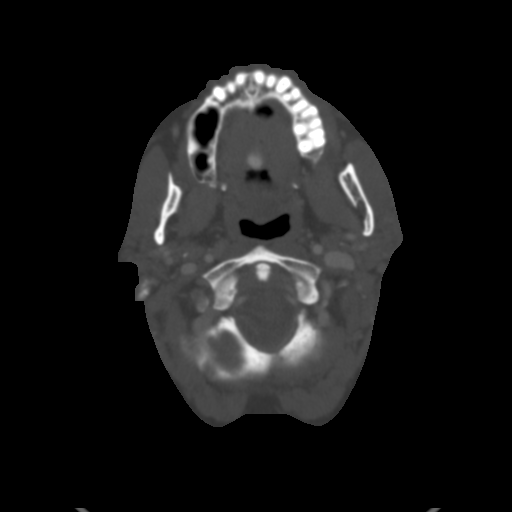

[Series 3: axial (person_name) · axial · 0.44mm/px · 1 of 127 slices shown]
[im 26/127  bone]
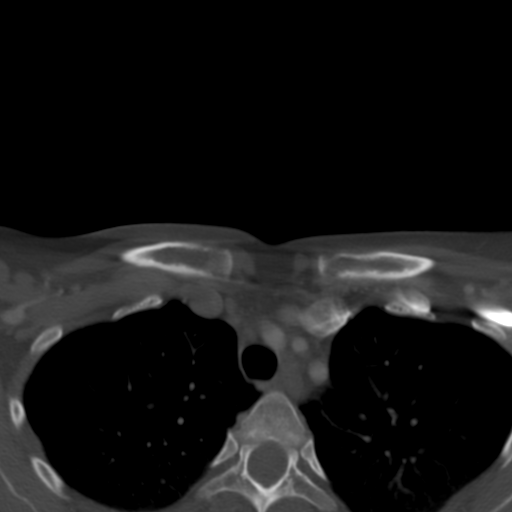

[Series 4: (person_name) · coronal · 0.37mm/px · 3 of 85 slices shown]
[im 17/85  bone]
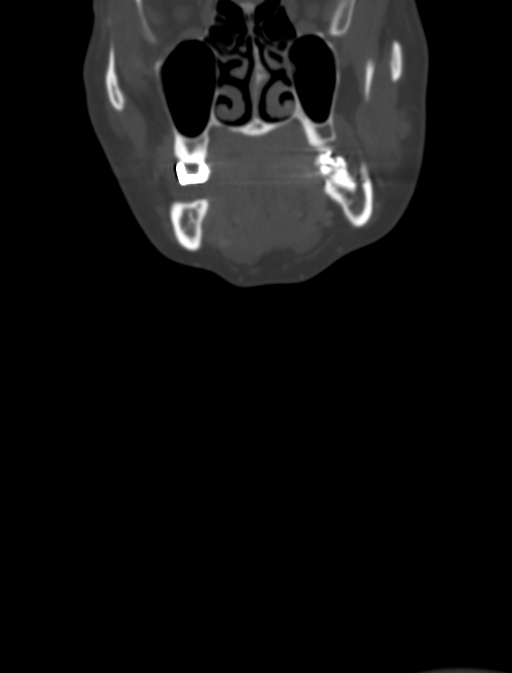
[im 34/85  bone]
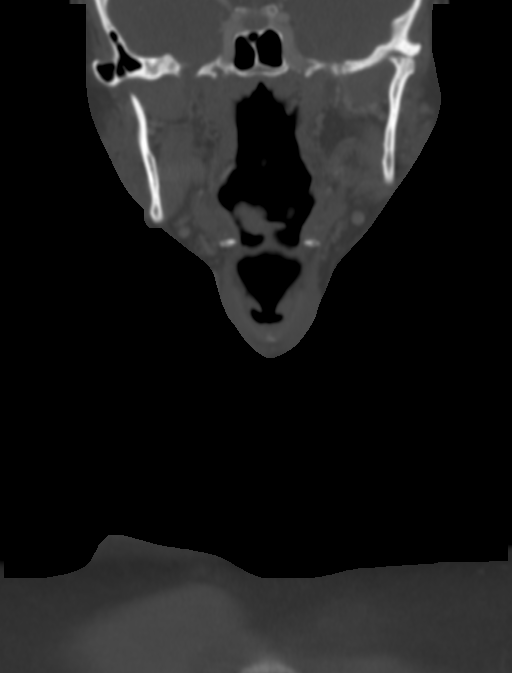
[im 51/85  bone]
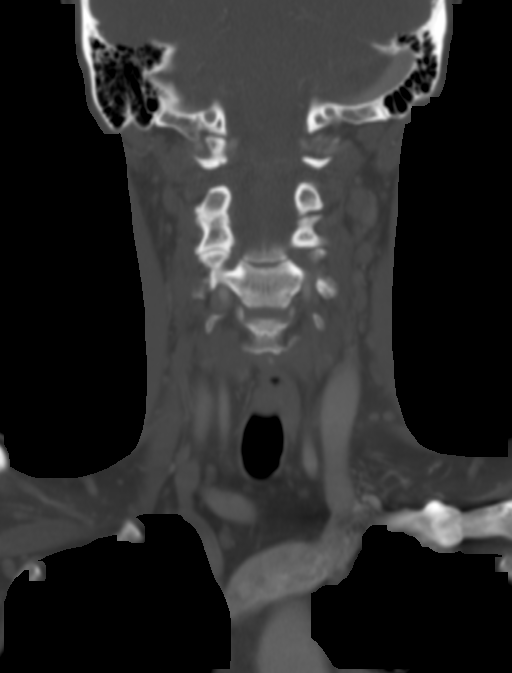

[Series 5: sag (person_name) · sagittal · 0.41mm/px · 5 of 71 slices shown, 6 images]
[im 24/71  bone]
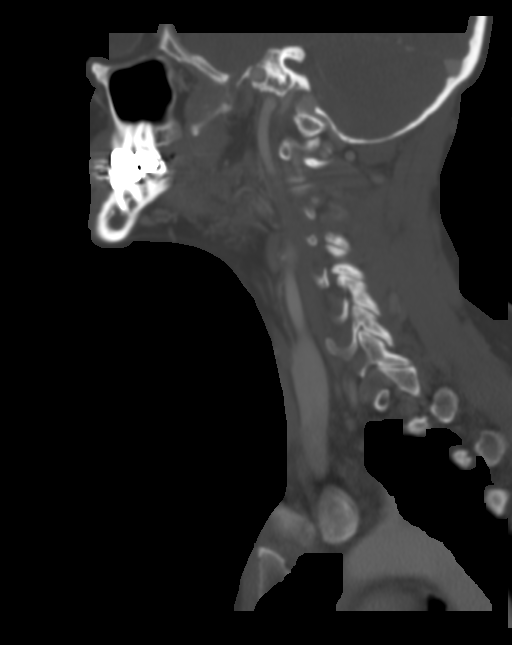
[im 30/71  bone]
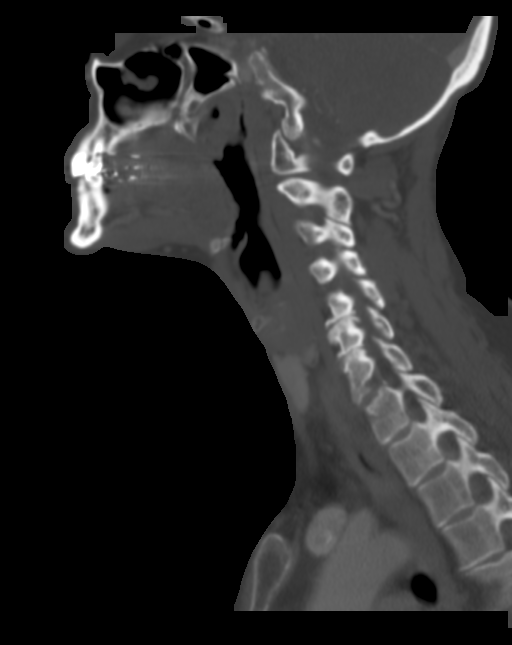
[im 36/71  soft-tissue]
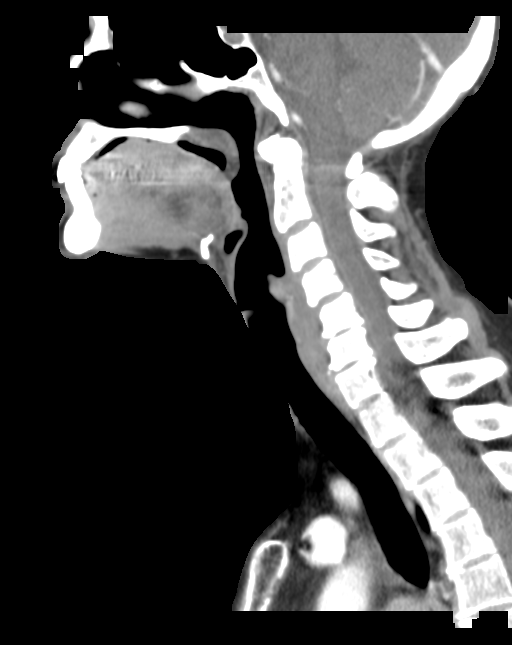
[im 36/71  bone]
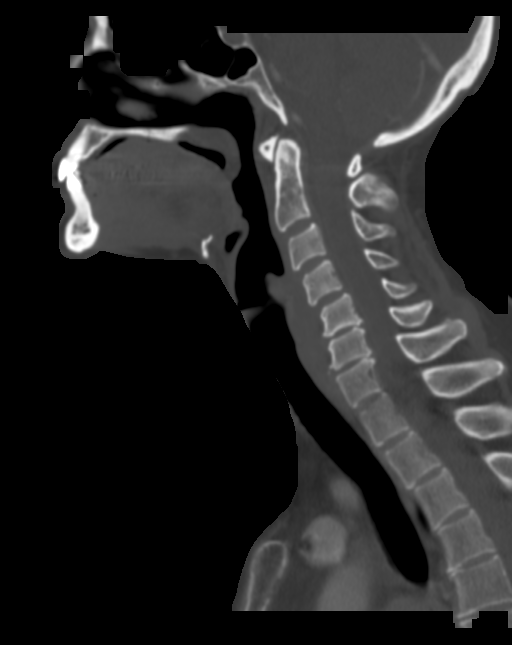
[im 41/71  bone]
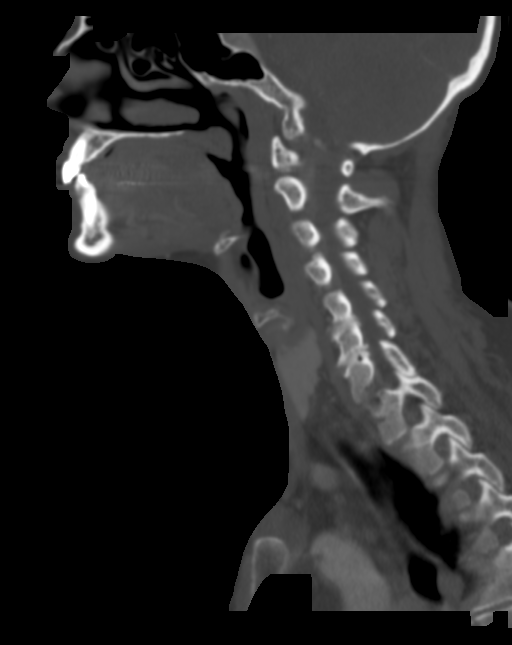
[im 47/71  bone]
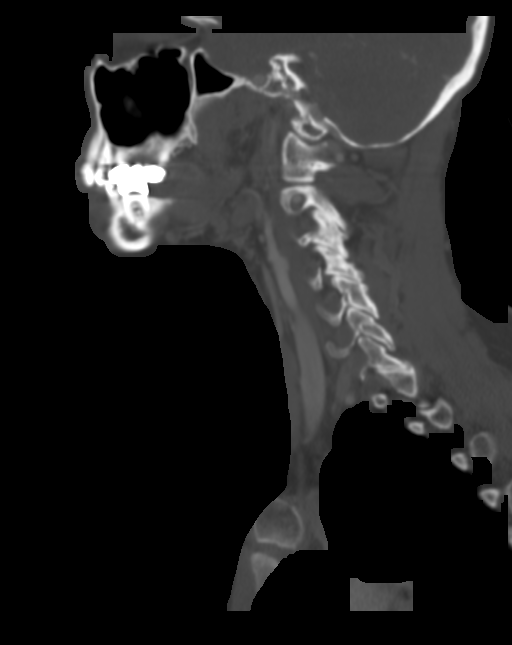

[13 of 33 positions shown; findings below may reference images not displayed]

FINDINGS: Nasopharyngeal contours are unremarkable. There is no evidence for 
tonsillar pillar mass. No inflammatory collection identified. There is lymphoid 
hyperplasia along the tongue base, greater on the right, encroaching on the 
upper right vallecula. Direct visualization would be useful. The findings are 
best seen on axial images 42-44, with asymmetric soft tissue in the upper right 
vallecula measuring 7 x 4 mm. Piriform sinuses, aryepiglottic folds, and glottic 
contours appear symmetric. The subglottic trachea is open. 
There is no lymphadenopathy. There are nonenlarged lymph nodes in the internal 
jugular and spinal accessory chains bilaterally. There is a 7 x 4.5 mm right 
sublingual lymph node, axial image 42. Several other smaller bilateral 
submandibular compartment lymph nodes are present. 
There is no parotid lesion. Submandibular glands are quite small. Thyroid lobes 
are unremarkable. No upper mediastinal adenopathy. Upper lungs are clear. There 
is moderate cervical spondylosis. Lower cranial contents are unremarkable. 
Paranasal sinuses and otomastoid spaces appear clear. Jugular veins are open, 
the left dominant. Carotid arteries are open.
IMPRESSION: There is no evidence for pharyngeal mass or inflammatory collection. Tonsillar 
pillars are unremarkable. 
Lobulated soft tissue along the tongue base and encroaches on the upper right 
vallecula, likely lymphoid hyperplasia. Direct visualization advised. 
No lymphadenopathy. Nonenlarged lymph nodes in the internal jugular, spinal 
accessory and right sublingual compartments are most likely benign, reactive by 
size criteria. 
Submandibular glands are small bilaterally. 
No evidence for active otomastoid or paranasal sinus inflammation. 
RADIATION DOSE REDUCTION: All CT scans are performed using radiation dose 
reduction techniques, when applicable.  Technical factors are evaluated and 
adjusted to ensure appropriate moderation of exposure.  Automated dose 
management technology is applied to adjust the radiation doses to minimize 
exposure while achieving diagnostic quality images.

## 2020-08-29 IMAGING — CT CT SINUS WITHOUT CONTRAST
2 series · 15 of 40 positions shown, 18 images · non-contrast
Comparison: There are no previous exams available for comparison.

CT SINUS WITHOUT CONTRAST, 08/29/2020 [DATE]: 
CLINICAL INDICATION:  Chronic sinusitis, headache and congestion 
A search for DICOM formatted images was conducted for prior CT imaging studies 
completed at a non-affiliated media free facility.
TECHNIQUE: The paranasal sinuses were scanned without contrast on a high 
resolution CT scanner using dose reduction techniques.  Routine MPR 
reconstructions were performed.

[Series 3: axial · axial · 0.26mm/px · z∈[-145,-58]mm · 12 of 145 slices shown, 15 images]
[im 10/145  brain]
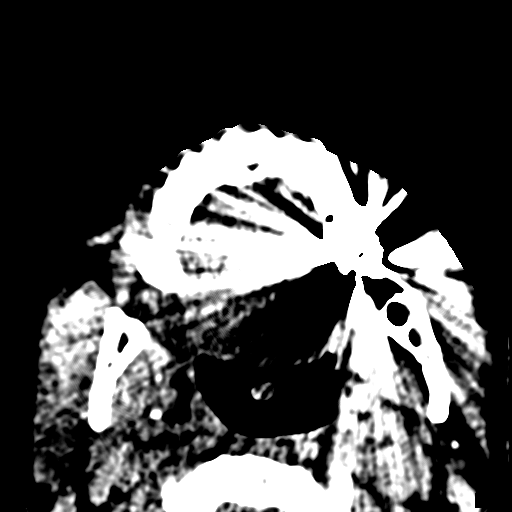
[im 10/145  bone]
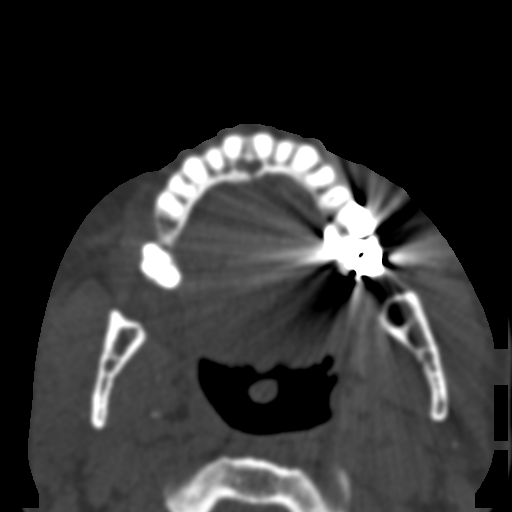
[im 20/145  bone]
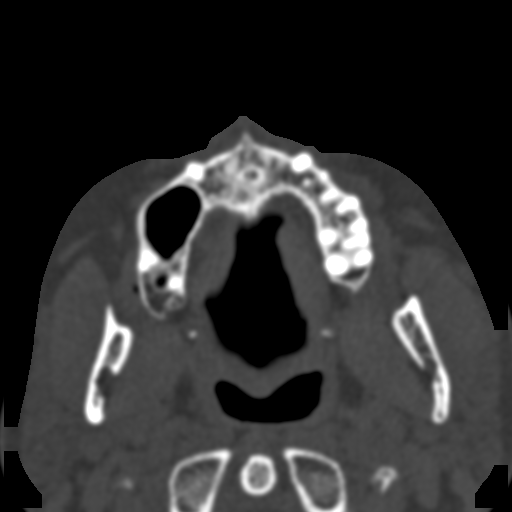
[im 30/145  bone]
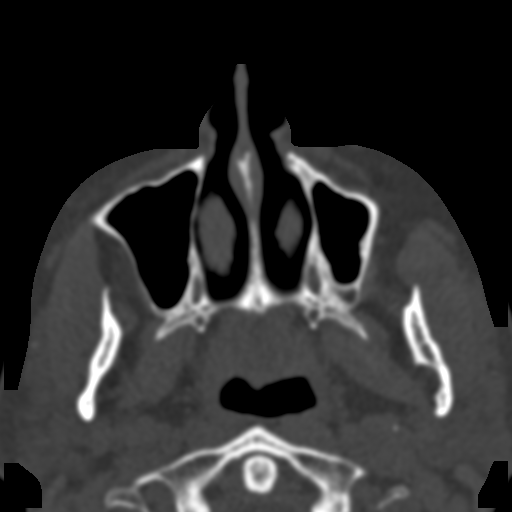
[im 45/145  bone]
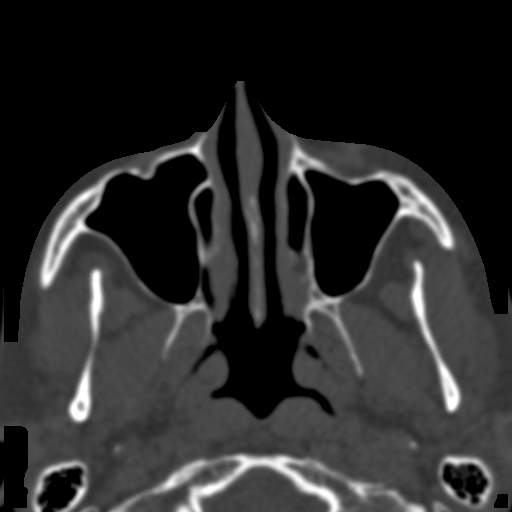
[im 55/145  brain]
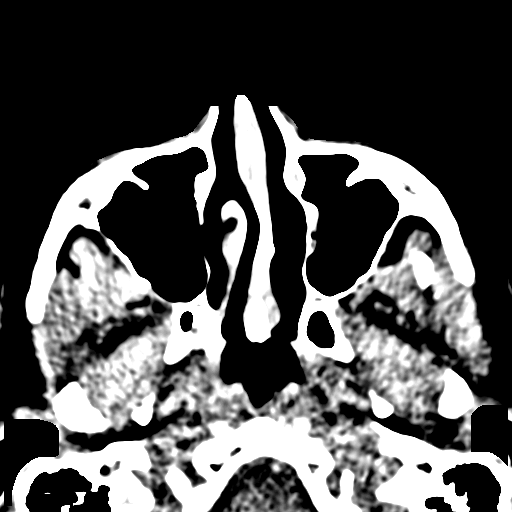
[im 55/145  bone]
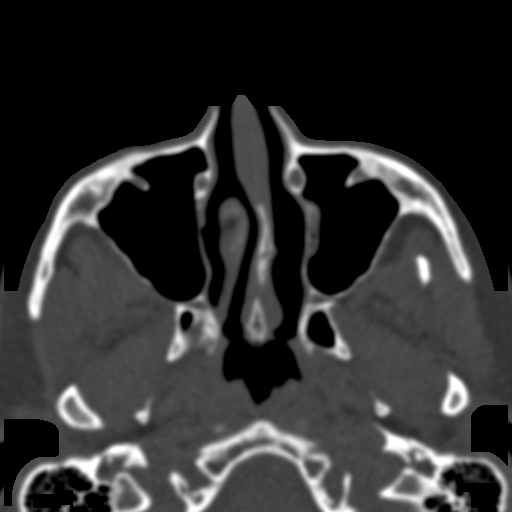
[im 65/145  bone]
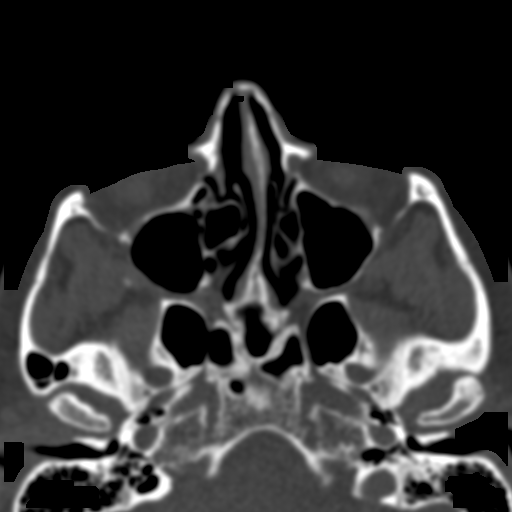
[im 80/145  bone]
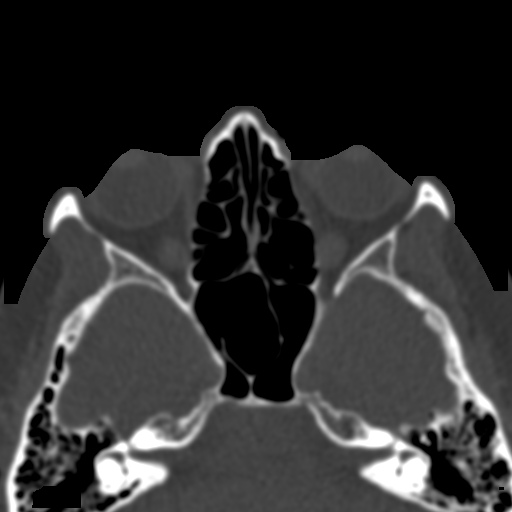
[im 90/145  bone]
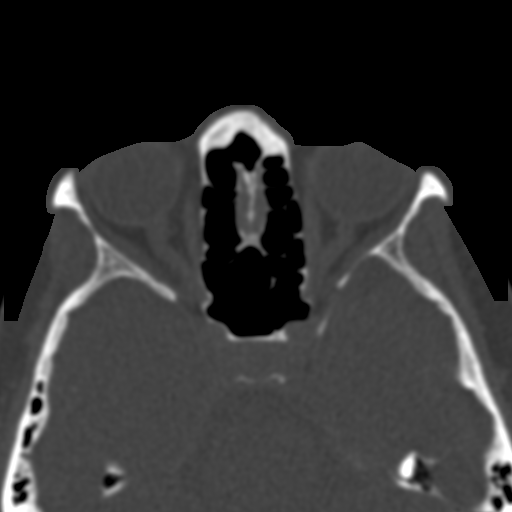
[im 100/145  brain]
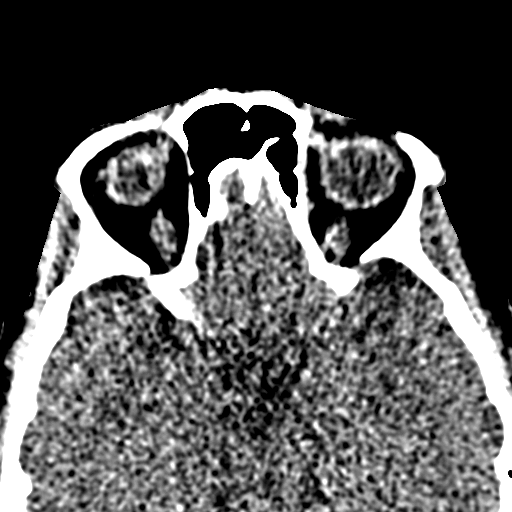
[im 100/145  bone]
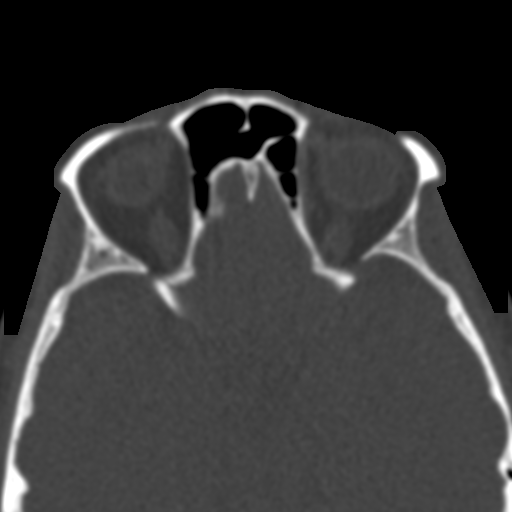
[im 115/145  bone]
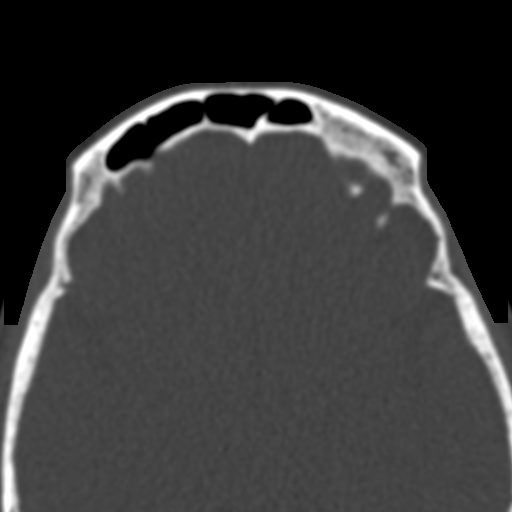
[im 125/145  bone]
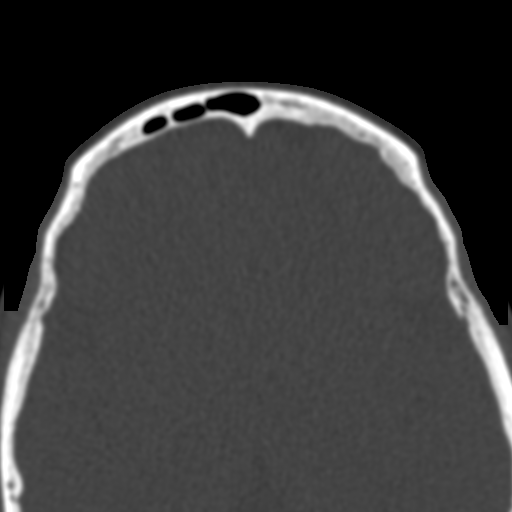
[im 135/145  bone]
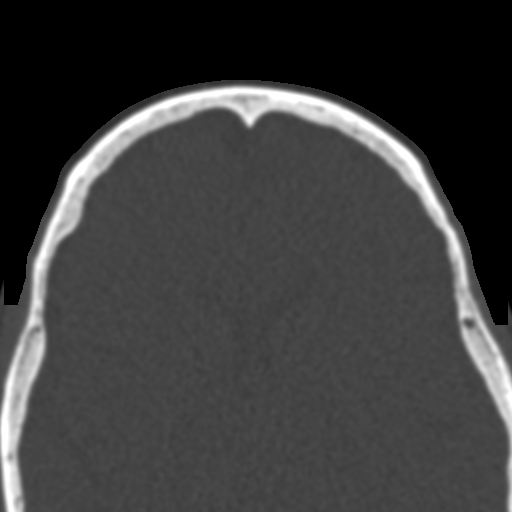

[Series 4: cor · coronal · 0.21mm/px · 3 of 178 slices shown]
[im 60/178  bone]
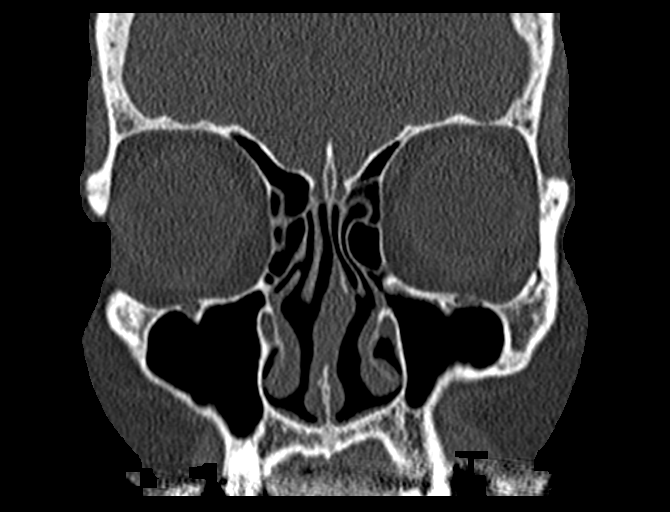
[im 79/178  bone]
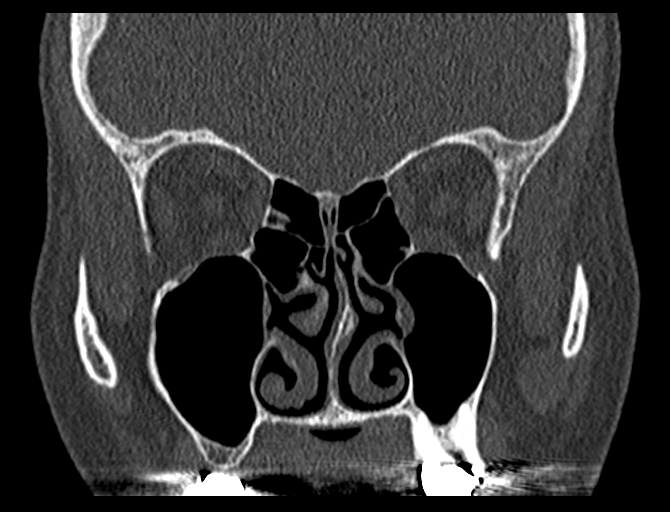
[im 99/178  bone]
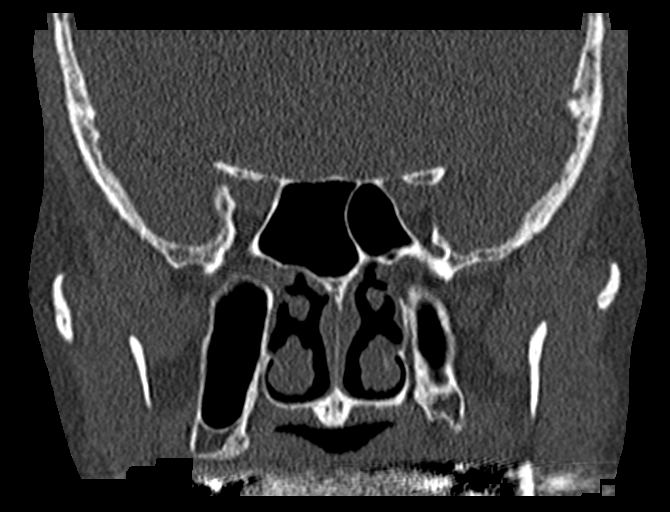

[15 of 40 positions shown; findings below may reference images not displayed]

FINDINGS: Frontal sinuses are normally developed and clear. The ethmoid, 
sphenoid and maxillary sinuses are predominantly clear. There is mild mucoid 
debris or mucosal thickening along the medial left antrum, measuring 8 x 3 mm on 
coronal image 79, AP extent 14 mm in AP image 55. The ostiomeatal units are open 
bilaterally. There is mild leftward septal deviation. No nasal fossa soft tissue 
mass. The floor of anterior cranial fossa is intact. Mild turbinate mucosal 
hypertrophy. 
Otomastoid spaces are clear. Nasopharyngeal contours are unremarkable. Lower 
cranial contents are unremarkable.
IMPRESSION: Mild mucosal thickening or mucoid debris along the medial left antrum. Otherwise 
paranasal sinuses are clear. 
Mild leftward septal deviation. No nasal fossa soft tissue mass identified. No 
bone erosion. 
RADIATION DOSE REDUCTION: All CT scans are performed using radiation dose 
reduction techniques, when applicable.  Technical factors are evaluated and 
adjusted to ensure appropriate moderation of exposure.  Automated dose 
management technology is applied to adjust the radiation doses to minimize 
exposure while achieving diagnostic quality images.

## 2020-09-25 IMAGING — MR MRI ORBITS FACE OR NECK  W/WO CONTRAST
15 of 20 series · 22 of 48 positions shown · IV contrast (gadavist)
Comparison: CT neck February 10, 2020

MRI brain and cranial nerves W/WO CONTRAST, 09/25/2020 [DATE]: 
CLINICAL INDICATION: Atypical facial pain
TECHNIQUE: Multiplanar, multiecho position MR images of the brain and cranial 
nerves were performed with and without intravenous enhancement.  7.5 cc's of 
gadavist is injected intravenously by hand.

[Series 101: survey · axial · 10.0mm · 0.94mm/px · 1 of 5 slices shown]
[im 1/5]
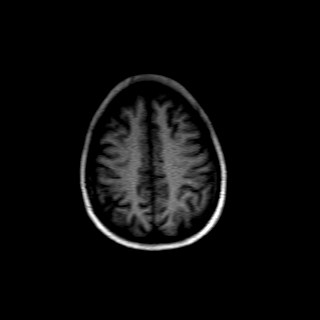

[Series 201: t1_se_sag · sagittal · 4.0mm · 0.49mm/px · 1 of 28 slices shown]
[im 1/28]
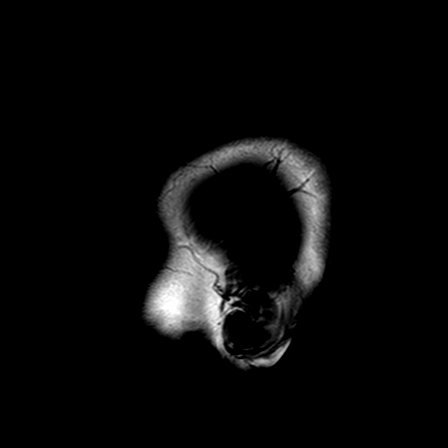

[Series 301: flair_ax · axial · 5.0mm · 0.49mm/px · 1 of 27 slices shown]
[im 1/27]
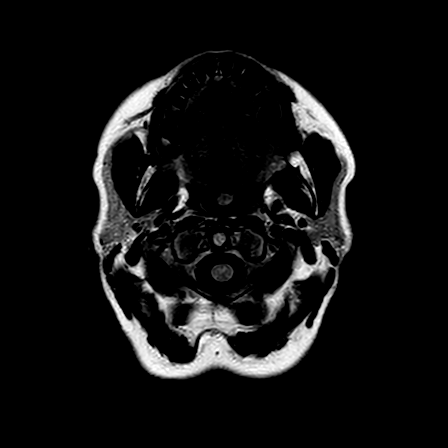

[Series 401: t1w_se_ax · axial · 5.0mm · 0.43mm/px · 1 of 27 slices shown]
[im 1/27]
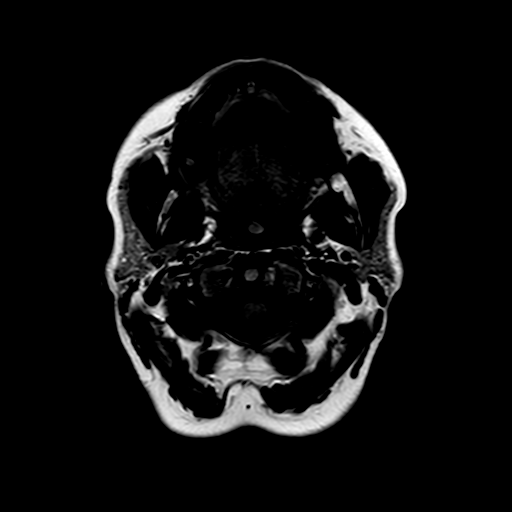

[Series 501: dwi_ax · axial · 5.0mm · 1.03mm/px · 1 of 54 slices shown]
[im 1/54]
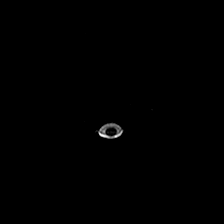

[Series 503: dadc map · axial · 5.0mm · 1.03mm/px · 1 of 27 slices shown]
[im 1/27]
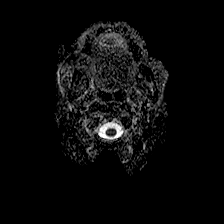

[Series 504: sb0 · axial · 5.0mm · 1.03mm/px · 1 of 27 slices shown]
[im 1/27]
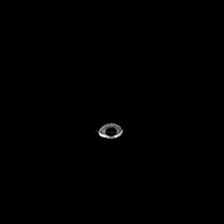

[Series 505: (id) · axial · 5.0mm · 1.03mm/px · 1 of 27 slices shown]
[im 1/27]
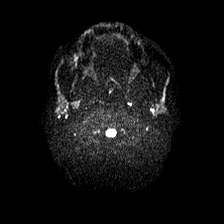

[Series 601: SWI · axial · 3.0mm · 0.40mm/px · z∈[-73,+75]mm · 5 of 200 slices shown]
[im 1/200]
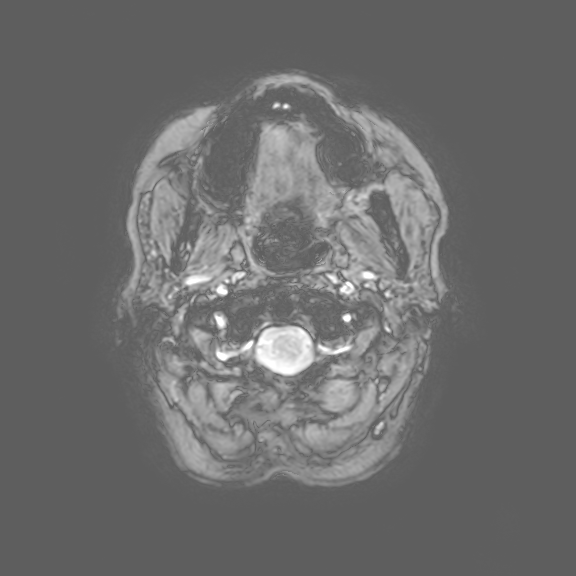
[im 50/200]
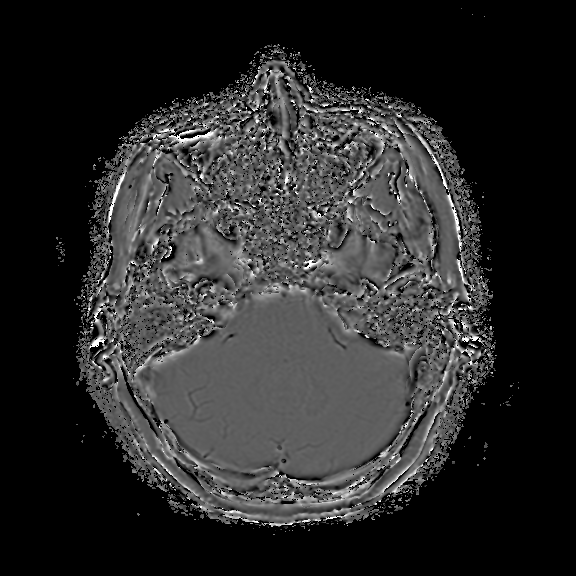
[im 100/200]
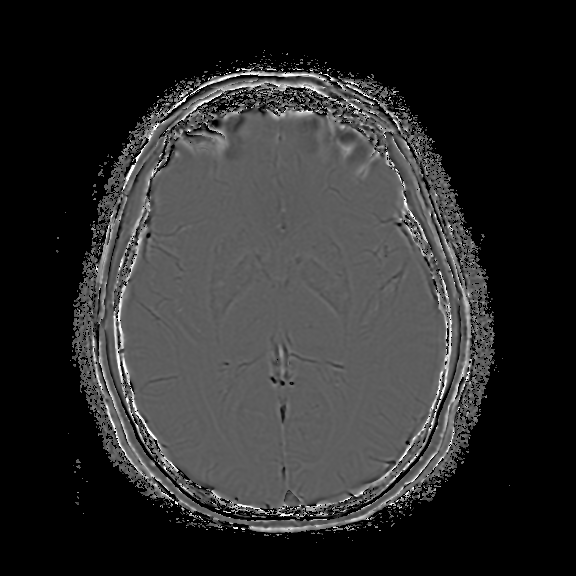
[im 150/200]
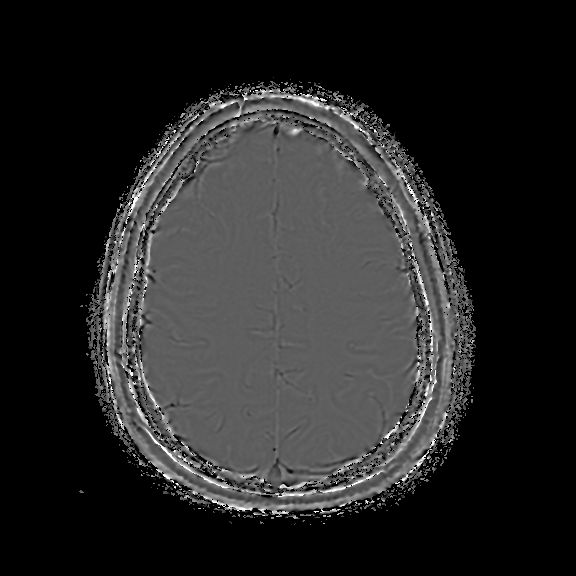
[im 200/200]
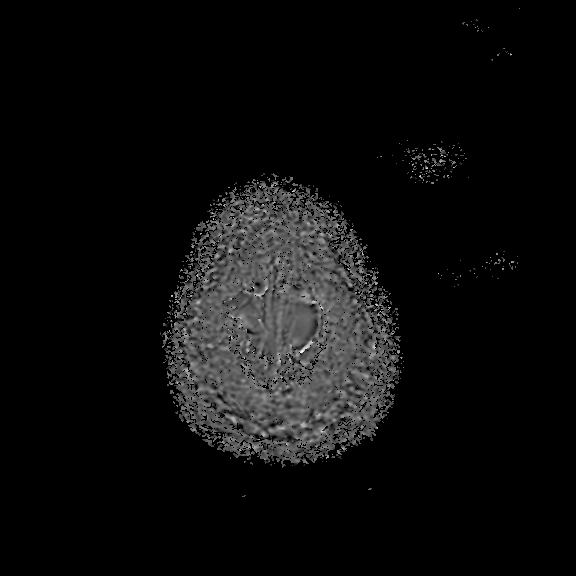

[Series 602: swip · axial · 10.0mm · 0.36mm/px · z∈[-63,+76]mm · 3 of 140 slices shown]
[im 1/140]
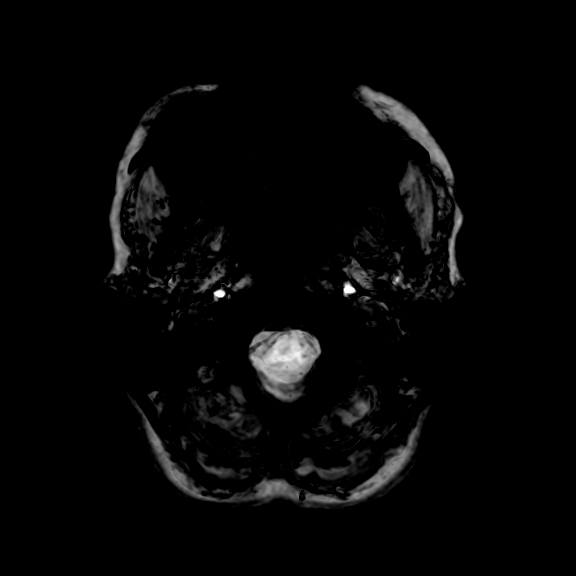
[im 70/140]
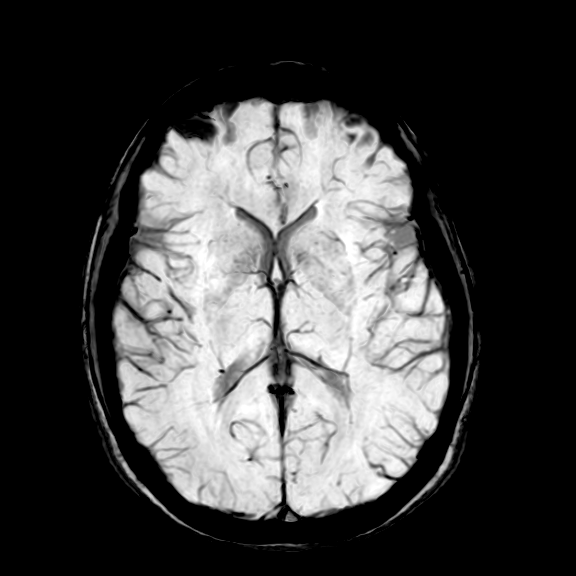
[im 140/140]
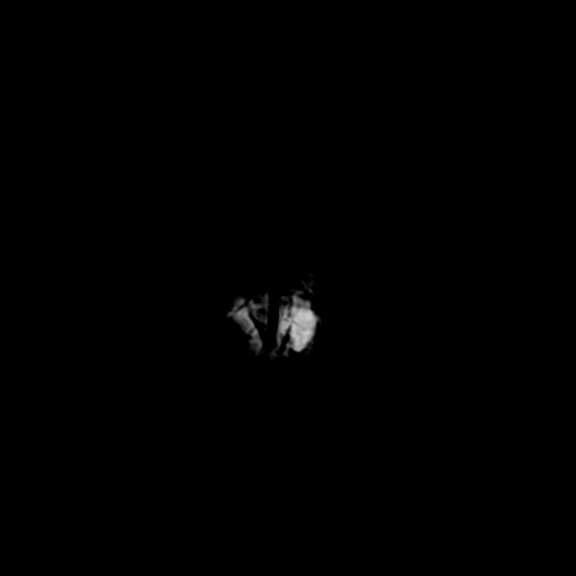

[Series 701: t2_ax · axial · 5.0mm · 0.43mm/px · 1 of 27 slices shown]
[im 1/27]
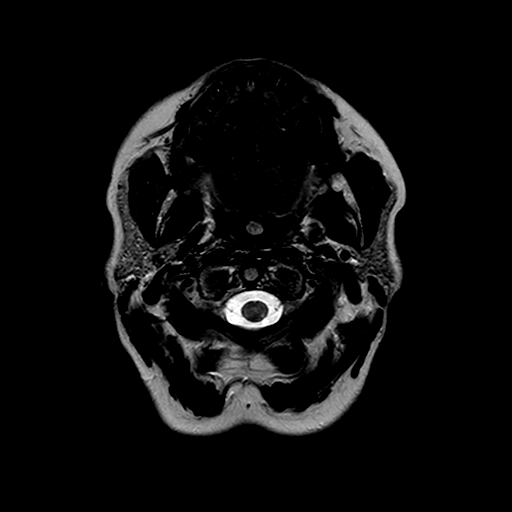

[Series 801: t2_fs_cor_thin* · coronal · 3.0mm · 0.31mm/px · 1 of 34 slices shown]
[im 1/34]
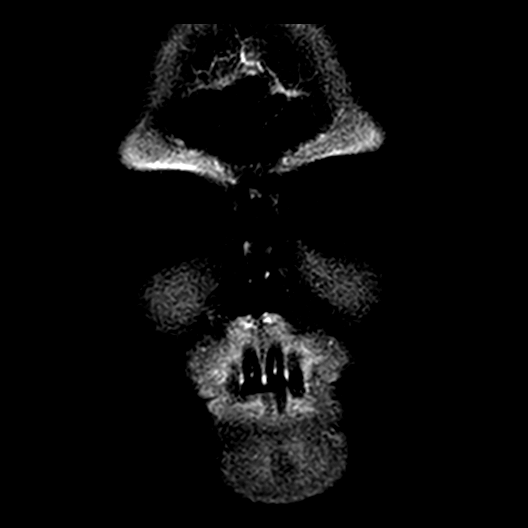

[Series 901: 3d_t2_tra_thin · axial · 1.1mm · 0.33mm/px · z∈[-71,-38]mm · 2 of 123 slices shown]
[im 1/123]
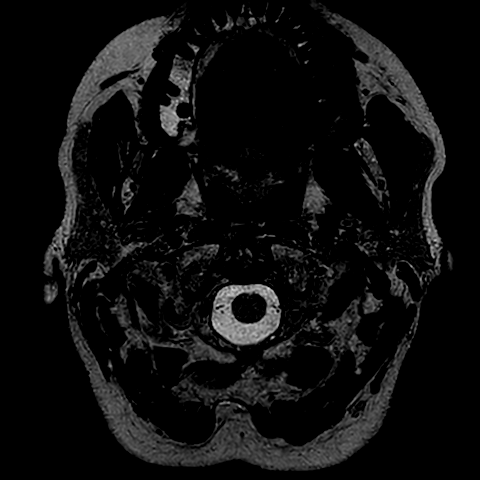
[im 62/123]
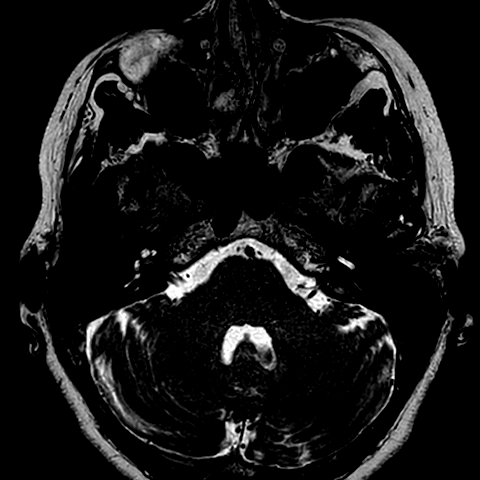

[Series 1101: T1 post-contrast · axial · 5.0mm · 0.43mm/px · 1 of 27 slices shown (1 of 2)]
[im 1/27]
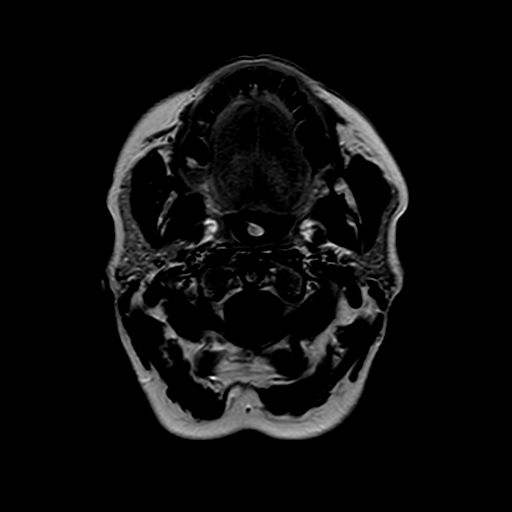

[Series 1201: T1 post-contrast · coronal · 4.0mm · 0.41mm/px · 1 of 34 slices shown (2 of 2)]
[im 1/34]
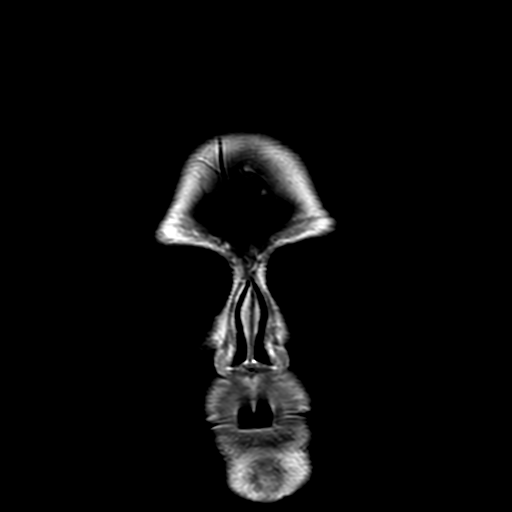

[22 of 48 positions shown; findings below may reference images not displayed]

FINDINGS: Diffusion images are negative. There is no significant atrophy for 
age. The craniocervical junction is open. Sellar contents are normal. The corpus 
callosum is intact. There are no significant white matter signal abnormalities. 
The thalami and other central ganglia, brainstem and cerebellum are 
unremarkable. There is no pathologic parenchymal or leptomeningeal enhancement 
or evidence for intracranial mass. 
There is no evidence for trigeminal nerve pathology. Cavernous sinuses and 
Meckel's cave cisterns are unremarkable. Cranial nerves VII and VIII are 
unremarkable. 
Major intracranial arterial segments appear open. Dural sinuses are open. The 
basilar artery is midline. No aneurysm or vascular malformation. Susceptibility 
images show no parenchymal hemosiderin.
IMPRESSION: Unremarkable cranial MRI, including detailed enhanced images of the cranial 
nerves. No mass, infarct or demyelinating lesion identified. No evidence for 
thalamic or other ganglionic or brainstem pathology.

## 2020-10-04 IMAGING — CT CTA ABDOMEN AND PELVIS
2 of 5 series · 15 of 46 positions shown, 17 images · IV contrast (ISOVUE 300)
Comparison: 09/28/2019

#[id number] 
CTA ABDOMEN AND PELVIS, 10/04/2020 [DATE]: 
CLINICAL INDICATION: Left renal vein compression, left nephrolithiasis and left 
ureteral stent placement. 
A search for DICOM formatted images was conducted for prior CT imaging studies 
completed at a non-affiliated media free facility.
TECHNIQUE: The abdomen and pelvis was scanned from lung bases through the pubic 
rami with 100 mL of Isovue 370 injected intravenously on a high resolution low 
dose CT scanner using dose reduction techniques.  Routine MPR and MIP 3D 
renderings were reconstructed on an independent workstation with concurrent 
physician supervision.

[Series 5: cor art · coronal · 0.83mm/px · 3 of 116 slices shown]
[im 29/116  soft-tissue]
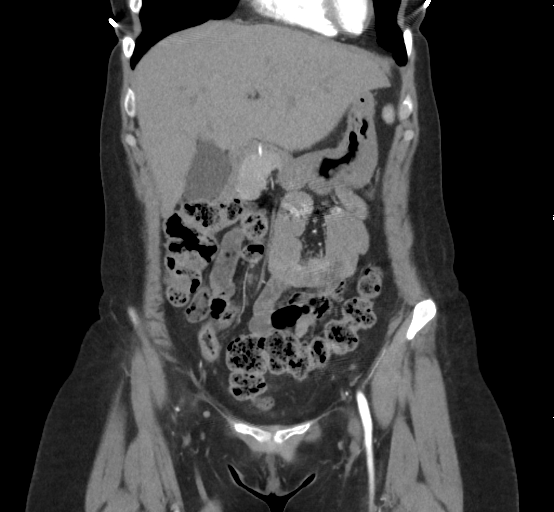
[im 58/116  soft-tissue]
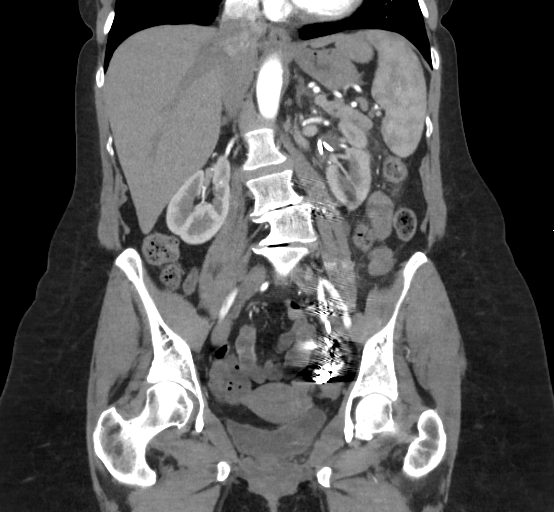
[im 87/116  soft-tissue]
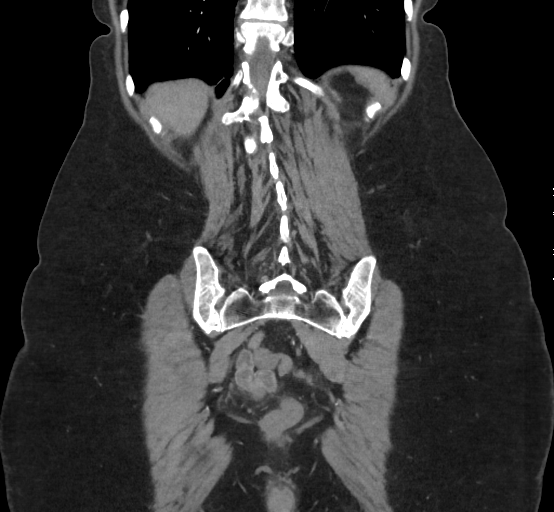

[Series 6: portal with 3.0 i41s 2 · axial · portal-venous · 0.77mm/px · z∈[-510,-140]mm · 12 of 143 slices shown, 14 images]
[im 10/143  soft-tissue]
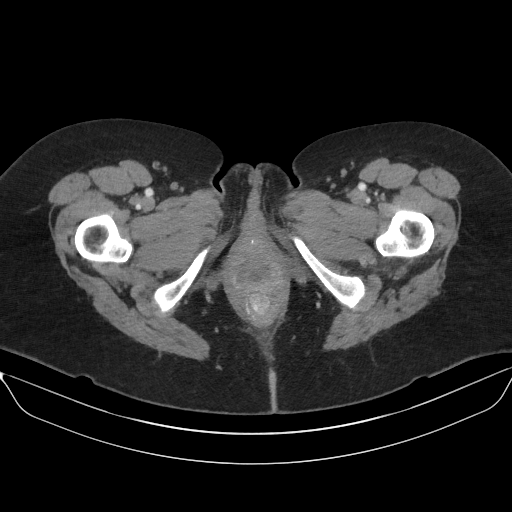
[im 10/143  bone]
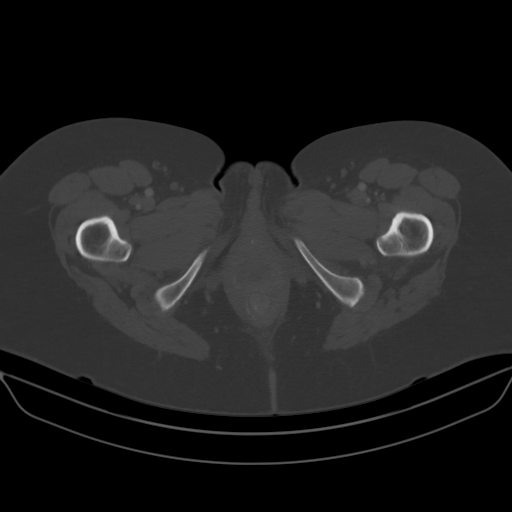
[im 19/143  soft-tissue]
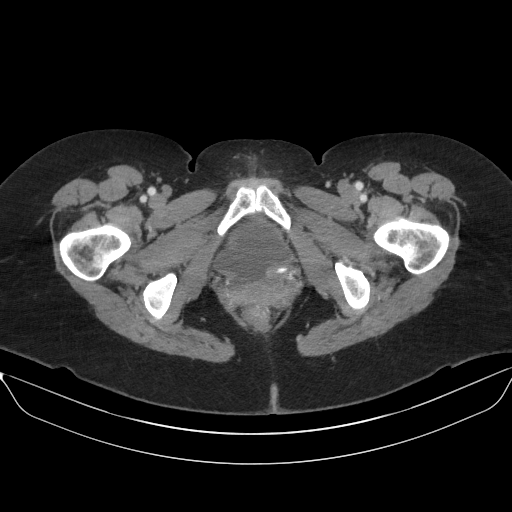
[im 29/143  soft-tissue]
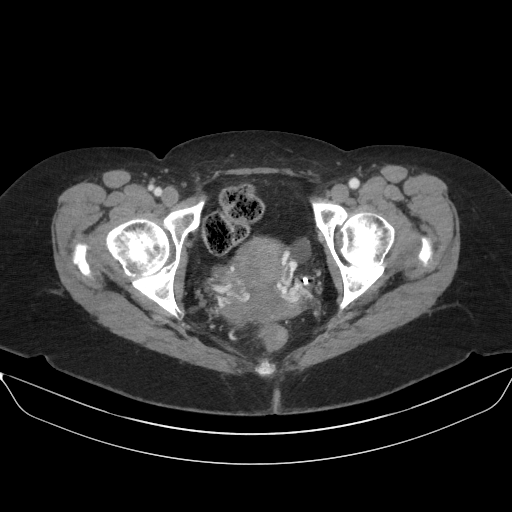
[im 48/143  soft-tissue]
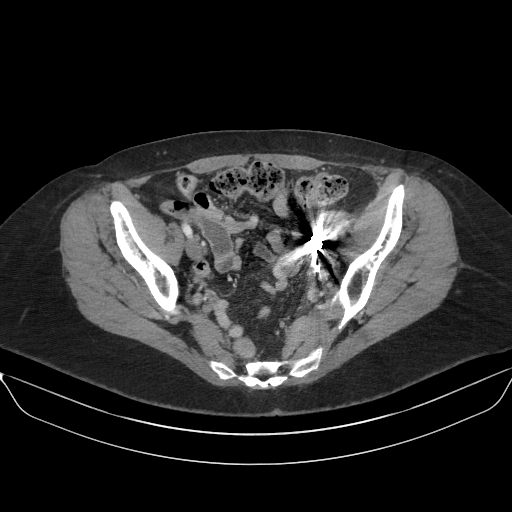
[im 57/143  soft-tissue]
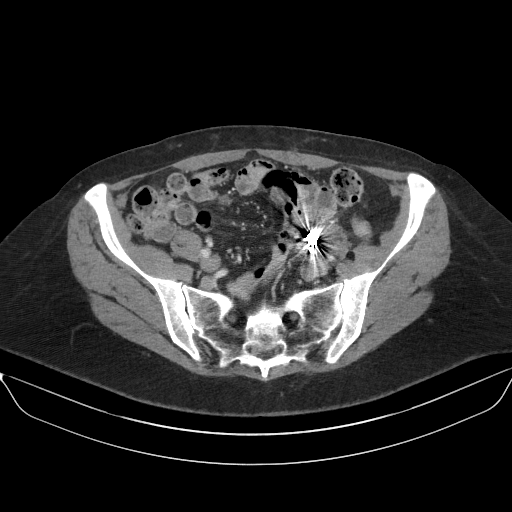
[im 67/143  soft-tissue]
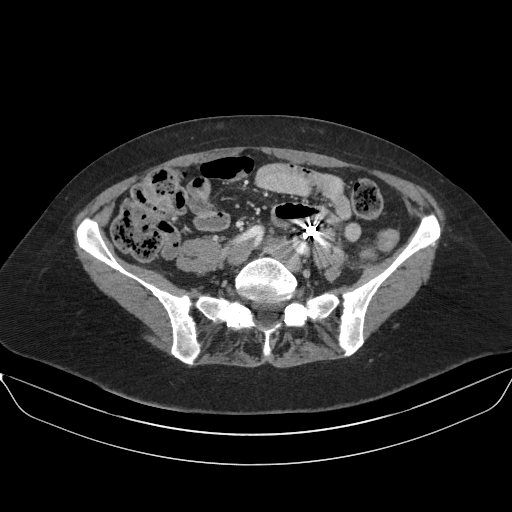
[im 76/143  soft-tissue]
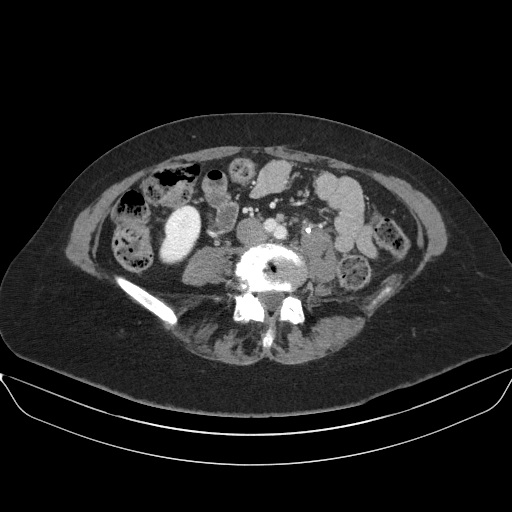
[im 86/143  soft-tissue]
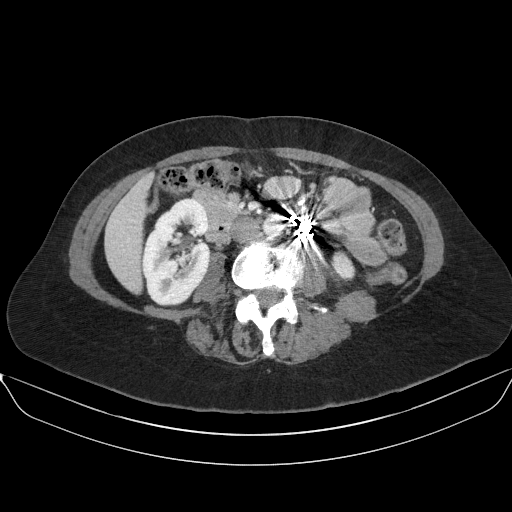
[im 95/143  soft-tissue]
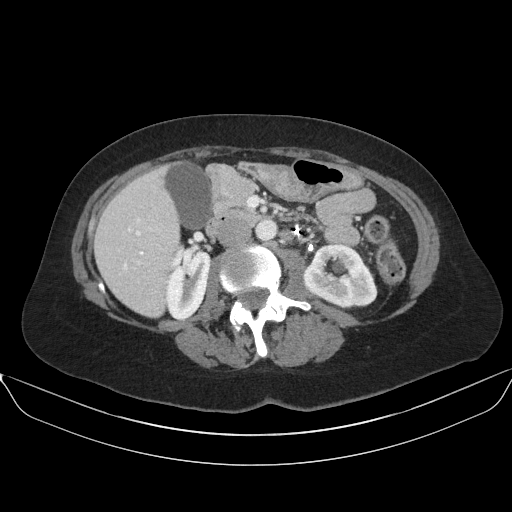
[im 95/143  bone]
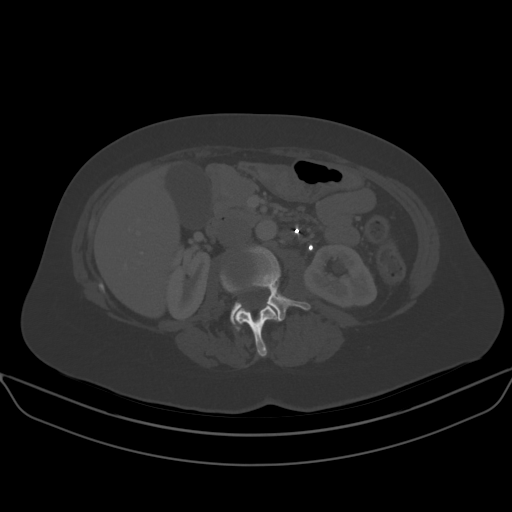
[im 114/143  soft-tissue]
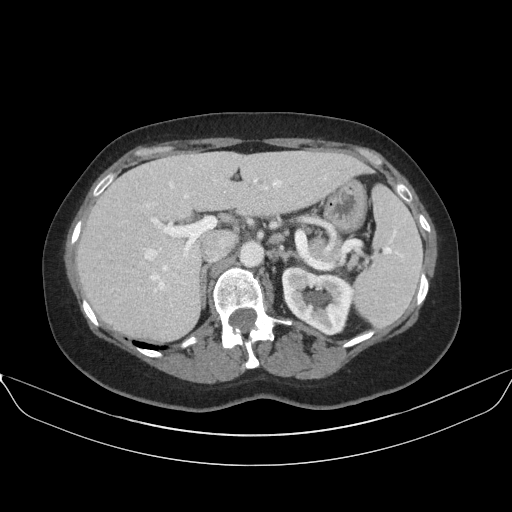
[im 124/143  soft-tissue]
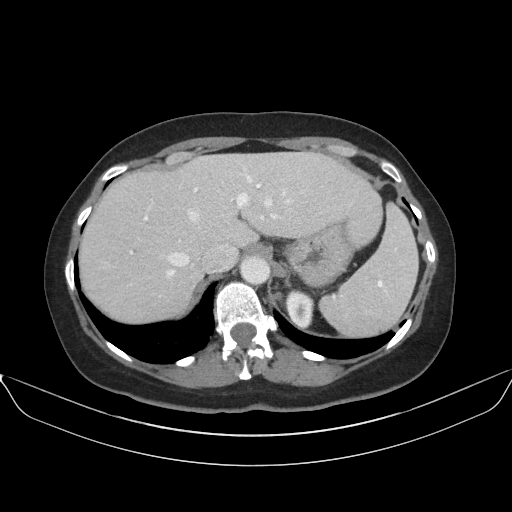
[im 133/143  soft-tissue]
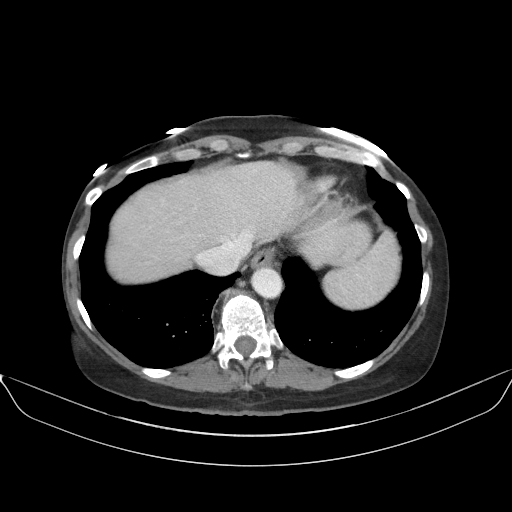

[15 of 46 positions shown; findings below may reference images not displayed]

FINDINGS: ABDOMINAL AORTA: Measures 15 cm in size. No dissection or aneurysm. 
MESENTERIC ARTERIES: No significant mesenteric stenosis or aneurysm. 
RENAL ARTERIES: No significant renal artery stenosis or aneurysm. 
ILIOFEMORAL VESSELS: Common iliac arteries measure 8 mm on right and 8 mm on the 
left. No significant iliofemoral vessel stenosis or aneurysm. 
VENOUS SYSTEM: The left renal vein appears less compressed at the level of the 
aorta and SMA in comparison with the prior examination. A left ovarian vein 
embolization has been performed since the previous study. It appears to have 
been successful with occlusion of the vein and no current evidence of 
significant ovarian vein opacification in the abdomen or pelvis. The pelvic 
veins and IVC appear widely patent without evidence of acute abnormalities. 
LUNG BASES: Clear. 
LIVER: No mass.  No intrahepatic biliary dilatation. 
GALLBLADDER: No wall thickening.  No stones. 
COMMON BILE DUCT: Normal caliber.  No stones. 
SPLEEN: Within normal limits. 
PANCREAS: No mass.  No pancreatic fluid collections. 
ADRENALS: No masses. 
URINARY SYSTEM: There is evidence of mild left hydronephrosis. There is a left 
ureteral stent which appears to be appropriately positioned. No nephrolithiasis 
or ureterolithiasis is currently identified. No abnormalities in the appearance 
of the urinary bladder are apparent. 
LYMPH NODES: No adenopathy. 
STOMACH, SMALL BOWEL AND COLON: No bowel wall thickening or obstruction. 
PERITONEAL CAVITY: No mesenteric stranding or free fluid. 
OSSEOUS STRUCTURES: There is evidence of intervertebral disc space narrowing 
with vacuum phenomena and and bony sclerosis at L3-4 and L4-5. 
PELVIC ORGANS: No abnormalities are detected.
IMPRESSION: The left ovarian vein varices appear to have been successfully embolized without 
changes to suggest a complication. The left renal vein remains patent. The 
narrowing of the left renal vein between the SMA and the aorta appears less 
prominent on the current exam. 
There is mild left hydronephrosis. The left ureteral stent appears to be 
appropriately positioned. No calculi are detected. 
There is evidence of degenerative disease and spondylosis in the lower lumbar 
spine, predominantly at L3-4 and L4-5. 
RADIATION DOSE REDUCTION: All CT scans are performed using radiation dose 
reduction techniques, when applicable.  Technical factors are evaluated and 
adjusted to ensure appropriate moderation of exposure.  Automated dose 
management technology is applied to adjust the radiation doses to minimize 
exposure while achieving diagnostic quality images.

## 2020-11-06 ENCOUNTER — Other Ambulatory Visit: Payer: Self-pay

## 2020-12-20 IMAGING — DX CHEST PA AND LATERAL
1 series · 2 of 2 positions shown · non-contrast
Comparison: 12/02/2019.

CLINICAL INDICATION:  History of Tiger. No chest complaints.

[Series 1: PA · U · 0.14mm/px · 2 of 2 slices shown]
[im 1/2]
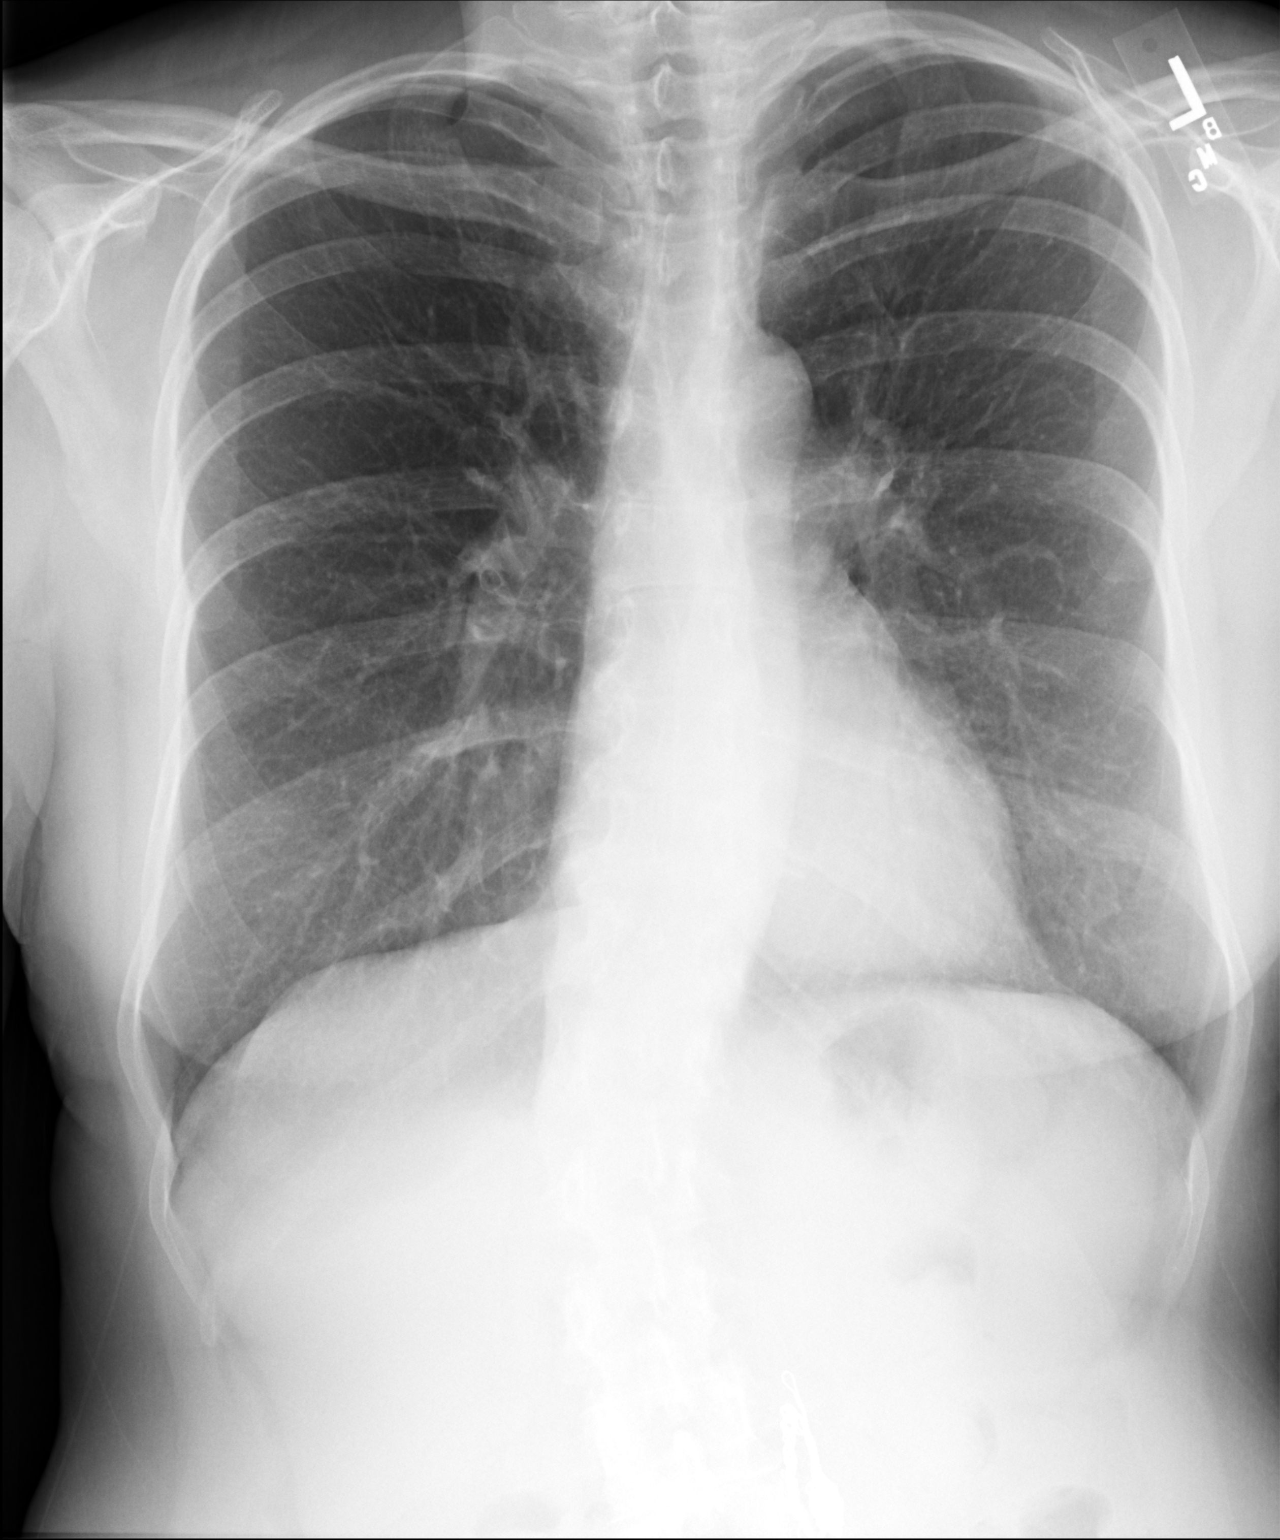
[im 2/2]
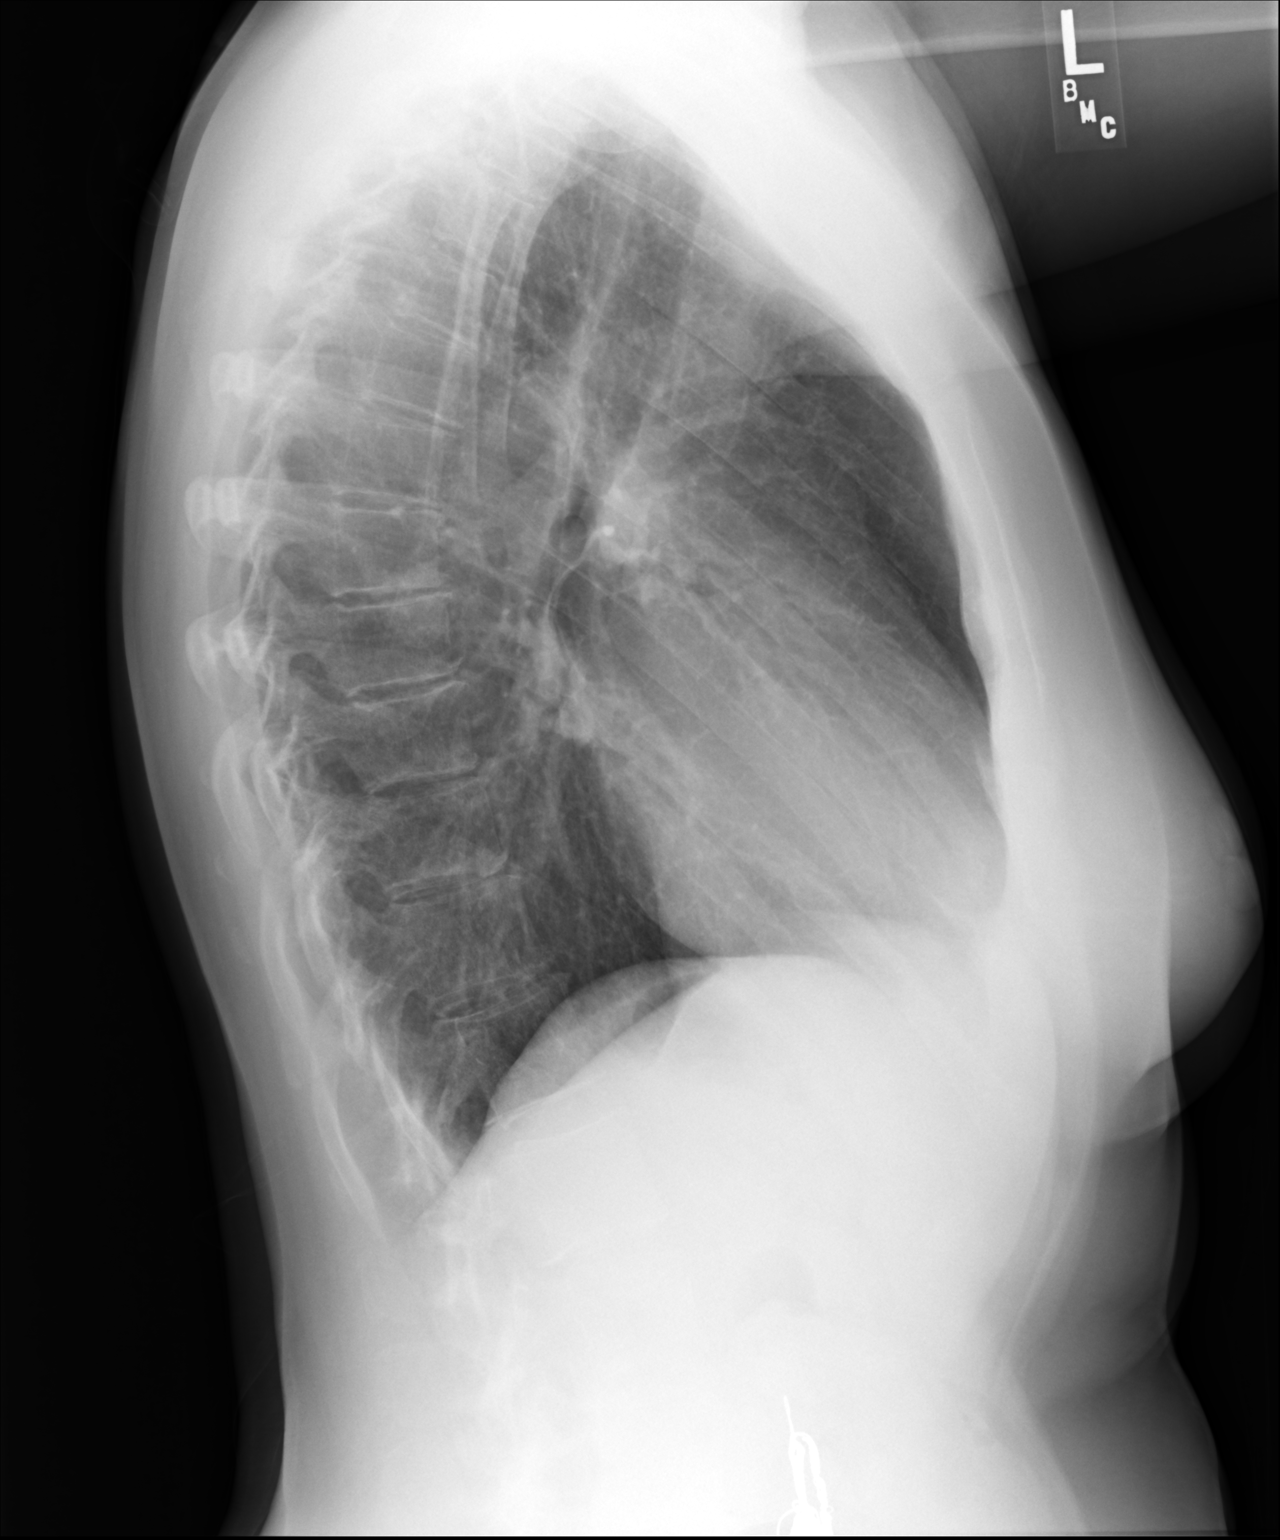

[2 of 2 positions shown; findings below may reference images not displayed]

FINDINGS: Lungs are clear. No consolidation. No effusion. Normal cardiac size 
and pulmonary vascularity. No pneumothorax. No acute osseous abnormality. 
Metallic coils within the upper abdomen are partially included.
IMPRESSION: No acute cardiopulmonary findings. Stable chest appearance.

## 2021-06-25 ENCOUNTER — Other Ambulatory Visit: Payer: Self-pay

## 2021-08-14 ENCOUNTER — Other Ambulatory Visit: Payer: Self-pay

## 2021-08-14 ENCOUNTER — Encounter (INDEPENDENT_AMBULATORY_CARE_PROVIDER_SITE_OTHER): Payer: Self-pay

## 2021-08-14 ENCOUNTER — Encounter (HOSPITAL_COMMUNITY): Payer: Self-pay | Admitting: Neurology with Special Qualifications in Child Neurology

## 2021-08-14 ENCOUNTER — Ambulatory Visit (INDEPENDENT_AMBULATORY_CARE_PROVIDER_SITE_OTHER): Payer: 59 | Admitting: Family Medicine

## 2021-08-14 VITALS — BP 110/72 | HR 68 | Temp 97.5°F | Resp 16 | Ht 67.0 in | Wt 163.0 lb

## 2021-08-14 DIAGNOSIS — J0100 Acute maxillary sinusitis, unspecified: Secondary | ICD-10-CM

## 2021-08-14 DIAGNOSIS — R058 Other specified cough: Secondary | ICD-10-CM

## 2021-08-14 MED ORDER — METHYLPREDNISOLONE 4 MG TABLETS IN A DOSE PACK
ORAL_TABLET | ORAL | 0 refills | Status: AC
Start: 2021-08-14 — End: ?

## 2021-08-14 MED ORDER — CEFDINIR 300 MG CAPSULE
300.0000 mg | ORAL_CAPSULE | Freq: Two times a day (BID) | ORAL | 0 refills | Status: AC
Start: 2021-08-14 — End: 2021-08-21

## 2021-08-14 NOTE — Progress Notes (Signed)
[facility]  [address]  [facility] [id number]       Name: Tracie [name] MRN:  [id number]   Date: 08/14/2021 Age: 55 y.o.     12/21/1965      Chief Complaint   Patient presents with   . Congestion     X1week  Room 6    . Ear Fullness     Bilateral     . Post Nasal Drip   . Hoarseness   . Sore Throat   . Sinus Pressure         HPI:  Tracie [name] is a 55 y.o. year old female who  Has hoarseness, sore throat, sinus pressure, ear fullness, congestion x 1 week.       ROS:  +congestion, ear fullness, post nasal drainage, hoarseness, sore throat, sinus pressure, prod cough (green), green rhinitis, maxillary sinus pressure (maxillary), l>r)  Denies     Vitals:    08/14/21 1107   BP: 110/72   Pulse: 68   Resp: 16   Temp: 36.4 C (97.5 F)   SpO2: 97%   Weight: 73.9 kg (163 lb)   Height: 1.702 m (5\' 7" )   BMI: 25.58        Past Medical History  Current Outpatient Medications   Medication Sig   . aMILoride-hydroCHLOROthiazide (MODURETIC) 5-50 mg Oral Tablet TAKE 1 TABLET DAILY   . Ascorbic Acid (VITAMIN C) 1,000 mg Oral Tablet Take 1,000 mg by mouth Once a day   . cefdinir (OMNICEF) 300 mg Oral Capsule Take 1 Capsule (300 mg total) by mouth Twice daily for 7 days   . cholecalciferol, vitamin D3, 1,000 unit Oral Tablet Take 1,000 Units by mouth Once a day Patient takes 2000 IU nightly   . cyanocobalamin, vitamin B-12, (VITAMIN B-12 ORAL) Take 6,000 mg by mouth   . cycloSPORINE (RESTASIS) 0.05 % Ophthalmic Dropperette Instill 1 Drop into both eyes Every 12 hours   . FLUoxetine (PROZAC) 20 mg Oral Capsule Take 1 Capsule (20 mg total) by mouth Once a day   . Glucosamine-Chondroit-Vit C-Mn 500-400 mg Oral Capsule Take by mouth   . Green Tea Leaf Extract Oral Capsule Take 500 Each by mouth    . HIZENTRA 10 gram/50 mL (20 %) Subcutaneous Solution    . HIZENTRA 2 gram/10 mL (20 %) Subcutaneous Solution    . Ibuprofen (MOTRIN) 600 mg Oral Tablet Take 600 mg by mouth Four times a day as needed for Pain   .  levothyroxine (SYNTHROID) 75 mcg Oral Tablet TAKE 1 TABLET BY MOUTH EVERY MORNING ON EMPTY STOMACH   . Methylprednisolone (MEDROL DOSEPACK) 4 mg Oral Tablets, Dose Pack Take as instructed.   . metoprolol tartrate (LOPRESSOR) 25 mg Oral Tablet Take 1 Tablet (25 mg total) by mouth Twice daily   . modafiniL (PROVIGIL) 100 mg Oral Tablet Take 1 Tablet (100 mg total) by mouth Once a day   . multivitamin Oral Tablet Take 1 Tab by mouth Once a day   . Non-Formulary/Special Preparation (MEDICATION HELP) Hydroxy elite diet supplement   . omeprazole (PRILOSEC) 40 mg Oral Capsule, Delayed Release(E.C.) TAKE 1 CAPSULE ORALLY EVERY EVENING BEFORE MEALS   . OZEMPIC 0.25 mg or 0.5 mg(2 mg/1.5 mL) Subcutaneous Pen Injector INJECT 0.5 MG EVERY WEEK BY SUBCUTANEOUS ROUTE FOR 90 DAYS.   potassium chloride (K-DUR) 20 mEq Oral Tab Sust.Rel. Particle/Crystal TAKE 1 TABLET DAILY   . Zinc 50 mg Tab take by mouth.  Allergies   Allergen Reactions   . Augmentin [Amoxicillin-Pot Clavulanate]      Extreme constipation    . Macrobid [Nitrofurantoin Monohyd/M-Cryst]    . Synthroid [Levothyroxine]      Leg pain     . Tetracycline Hives/ Urticaria     Past Medical History:   Diagnosis Date   . Abnormal Pap smear 2001    LSIL   . Acute neck pain 2014     muscle relaxant med   . Arthritis    . Breast cyst     bilateral, multiple, variable sizes.    . Chest pain 01/28/2008    work-up negative   . Dyspepsia 2014    Zantac med   . Dysplasia of cervix 2001    Colpo CIN 1   . Esophageal reflux    . Hypothyroidism    . MVP (mitral valve prolapse) 12/04/2016   . Other allergic rhinitis 12/04/2016   . Psychosocial stressors 2013 - 2014    . Sjogren's disease    . Thyroid disorder    . Unspecified breast disorder 54627 - present     large breast cysts    . Valvular disease          Past Surgical History:   Procedure Laterality Date   . BIOPSY BREAST  10/12/03; 11/03/07,  04/28/2012     left breast aspirations x 2 - 2004 - 15 ml turbid and 2009 - 3 ml ;  04/28/2012 Right with rare calcifications.   . HX BREAST BIOPSY Right     needle bx.  age 22   . HX CYST INCISION AND DRAINAGE Left     aspiration   . HX DILATION AND CURETTAGE     . HX TONSILLECTOMY  1974   . REPLACEMENT TOTAL KNEE Right 12/10/2016         Family Medical History:     Problem Relation (Age of Onset)    Breast Cancer Maternal Aunt    Cancer Son    Diabetes Paternal Grandmother    Healthy Mother    Heart Attack Son    Heart Disease Paternal Grandfather, Father    Hypertension (High Blood Pressure) Paternal Grandfather, Father    Leukemia Maternal Uncle    Other Mother, Sister    Uterine Fibroids Mother          Social History     Socioeconomic History   . Marital status: Married     Spouse name: Kendell Bane   . Number of children: 3   . Years of education: 12   Occupational History   . Occupation: Audiological scientist     Comment: [employer].     Employer: [employer]      Comment: sp:  Programmer, multimedia   Tobacco Use   . Smoking status: Former     Packs/day: 0.25     Years: 2.00     Pack years: 0.50     Types: Cigarettes     Quit date: 10/22/1991     Years since quitting: 29.8   . Smokeless tobacco: Never   Substance and Sexual Activity   . Alcohol use: No   . Drug use: No   . Sexual activity: Yes     Partners: Male     Birth control/protection: Vasectomy   Other Topics Concern   . Abuse/Domestic Violence No   . Breast Self Exam Yes   . Caffeine Concern No   . Calcium intake  adequate No   . Computer Use Yes   . Exercise Concern No   . Helmet Use Yes   . Seat Belt Yes   . Special Diet Yes     Comment: low sodium diet   . Sunscreen used No   . Uses Gait Assitive Device (cane, walker, etc) No   . Right hand dominant Yes   . Left hand dominant No   . Ambidextrous No     Patient Active Problem List   Diagnosis   . Sjogren's syndrome (CMS HCC)   . Hyperlipidemia, acquired   . Atrial paroxysmal tachycardia (CMS HCC)   . Breast cyst   . Psychosocial stressors   . Dyspepsia   . Primary immunodeficiency disorder (CMS HCC)    . Primary osteoarthritis involving multiple joints   . Fibromyalgia syndrome   . Hip bursitis   . Vitamin D deficiency   . Acquired hypothyroidism   . IgG deficiency (CMS HCC)   . Chronic constipation   . Small fiber neuropathy (CMS HCC)   . Other allergic rhinitis   . MVP (mitral valve prolapse)   . Routine health maintenance   . 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 Afebrile. Mucousa pink and moist. Inferior turbinates erythematous bilaterally. Nares are patent. Pharynx without injection or exudate. Neck supple without adenopathy. TM's are clear bilaterally. Left maxillary sinus tenderness. Chest is clear to ausculation with a normal I:E ratio, no r/w/r. Heart is RRR. No rashes/lesions         Assessment:  Keymoni was seen today for congestion, ear fullness, post nasal drip, hoarseness, sore throat and sinus pressure.    Diagnoses and all orders for this visit:    Cough productive of purulent sputum    Maxillary sinusitis, acute    Other orders  -     cefdinir (OMNICEF) 300 mg Oral Capsule; Take 1 Capsule (300 mg total) by mouth Twice daily for 7 days  -     Methylprednisolone (MEDROL DOSEPACK) 4 mg Oral Tablets, Dose Pack; Take as instructed.           Plan      krogers ep        Followup if symptoms worsen, change or persist    Desma Paganini, PA-C

## 2022-05-23 ENCOUNTER — Other Ambulatory Visit: Payer: Self-pay

## 2022-09-05 IMAGING — DX CHEST PA AND LATERAL
2 series · 2 of 2 positions shown · non-contrast
Comparison: 12/20/2020, 10/12/2018

________________________________________________________________________________________________ 
CLINICAL INDICATION: Common Variable Immunodeficiency, Unspecified., History of 
Sjogrens, annual follow-up, remote history of smoking

[PA]
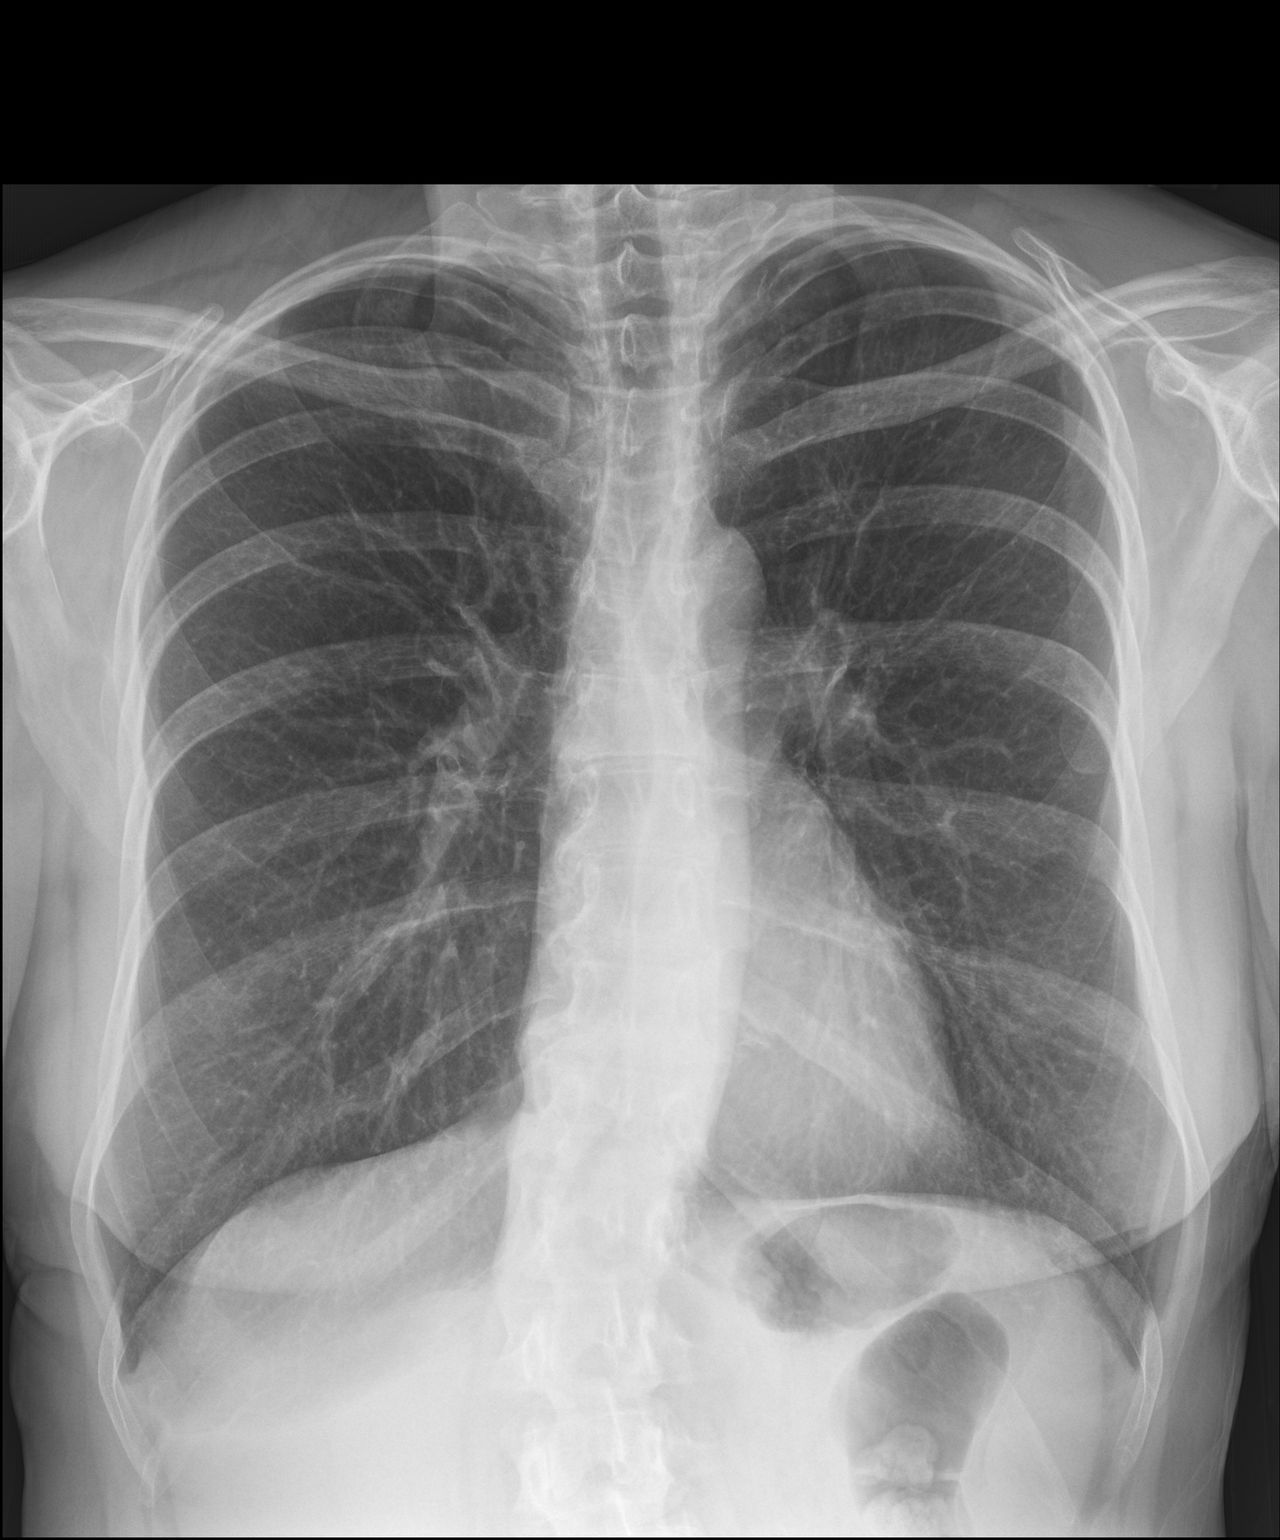

[lateral]
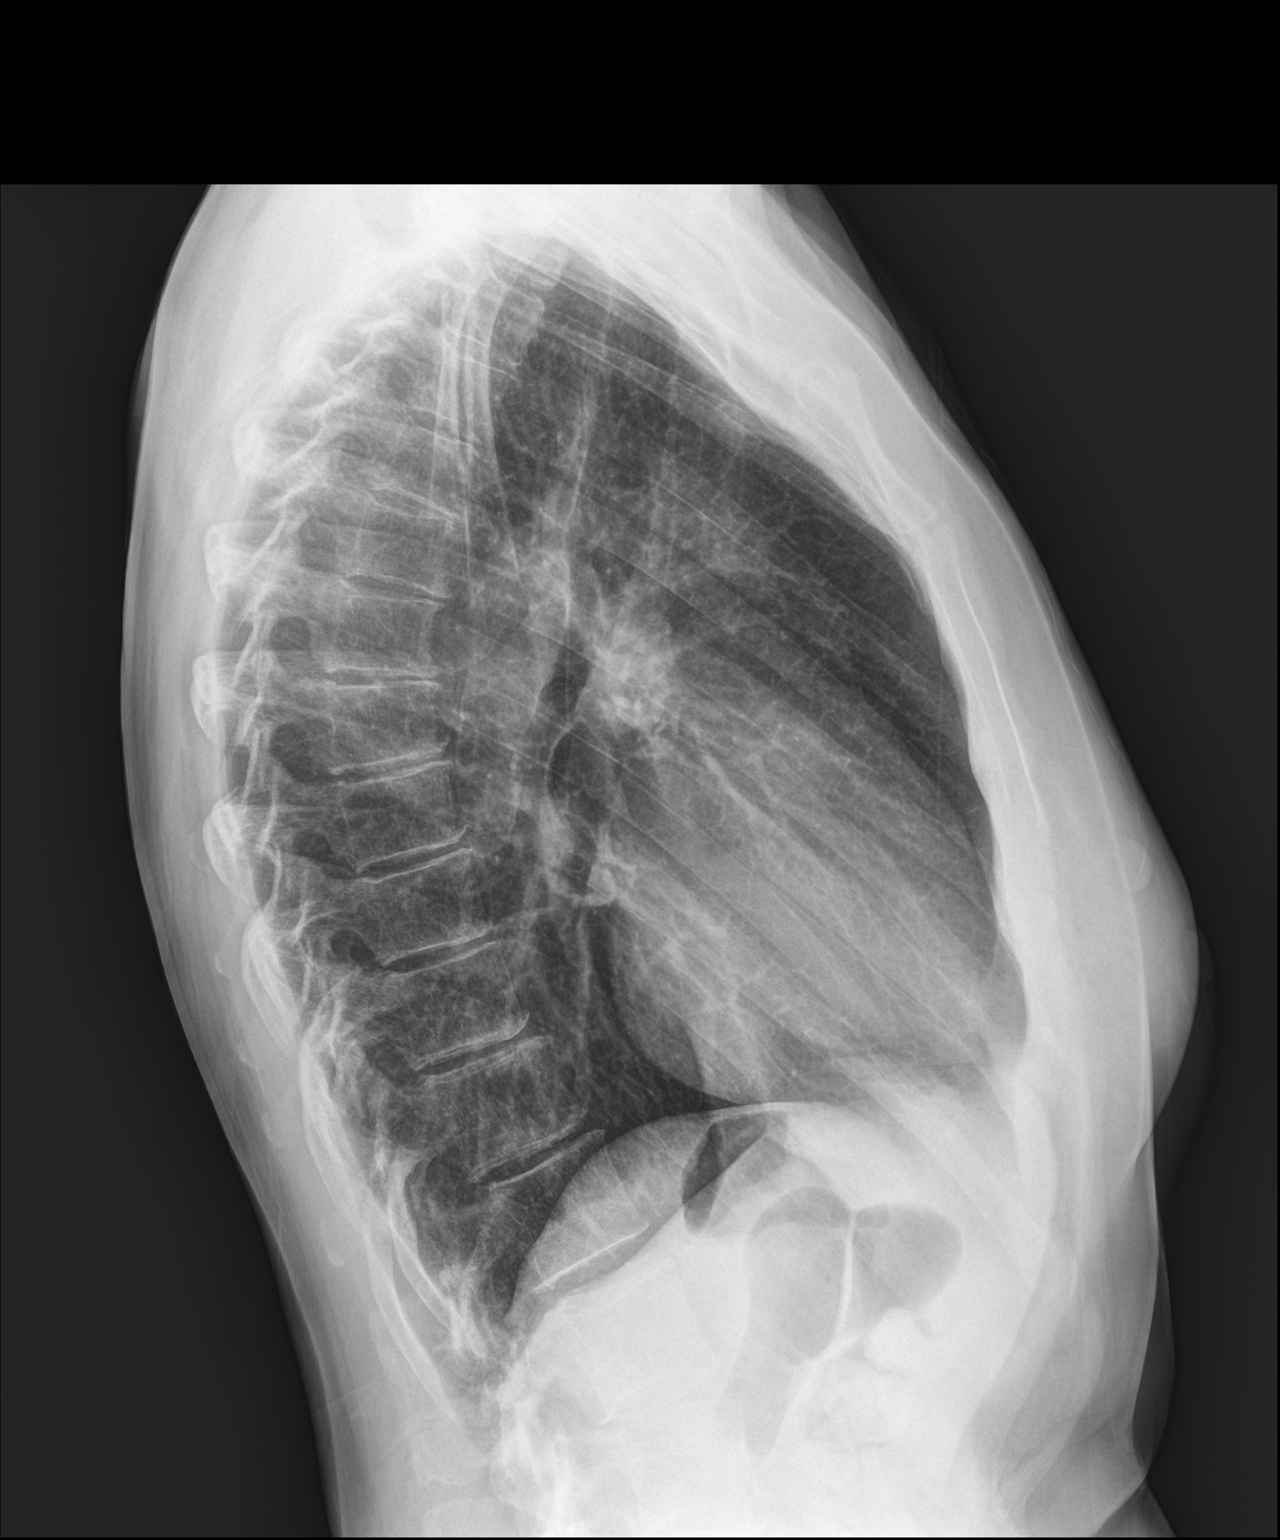

[2 of 2 positions shown; findings below may reference images not displayed]

FINDINGS: Normal heart size. Clear well-expanded lungs. No focal infiltrate or 
consolidation. Mild increased retrosternal clear space. No pleural effusion. 
Minimal degenerative thoracic spondylosis.
IMPRESSION: No acute cardiopulmonary findings.

## 2022-09-05 IMAGING — CT CT ABDOMEN AND PELVIS WITHOUT CONTRAST
2 of 3 series · 13 of 42 positions shown, 18 images · non-contrast
Comparison: CT scan from 6963

________________________________________________________________________________________________ 
CT ABDOMEN AND PELVIS WITHOUT CONTRAST, 09/05/2022 [DATE]: 
CLINICAL INDICATION: Right flank pain for 2 to 3 months. Difficulty eating. 
History of kidney stones. 
A search for DICOM formatted images was conducted for prior CT imaging studies 
completed at a non-affiliated media free facility.
TECHNIQUE: The abdomen and pelvis were scanned from lung bases through the 
pubic rami without contrast on a high-resolution CT scanner using dose reduction 
techniques. Routine MPR reconstructions were performed.

[Series 2: abd/pel wo · axial · 0.76mm/px · z∈[-564,-198]mm · 10 of 142 slices shown, 15 images]
[im 10/142  soft-tissue]
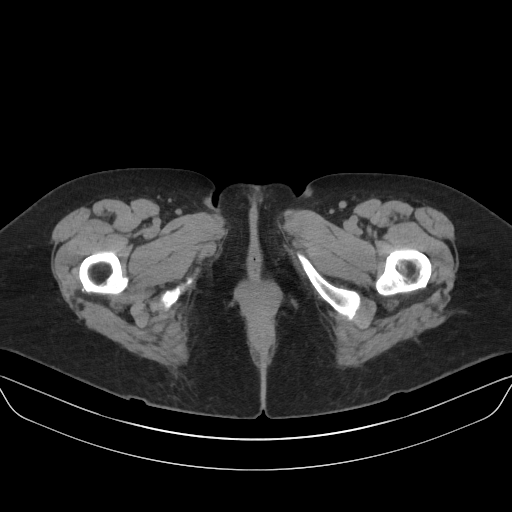
[im 10/142  bone]
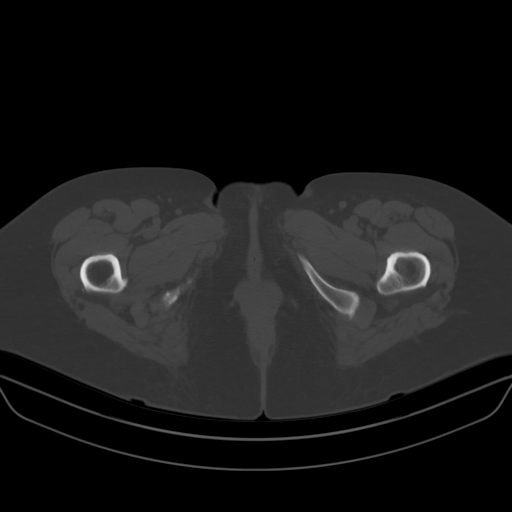
[im 29/142  soft-tissue]
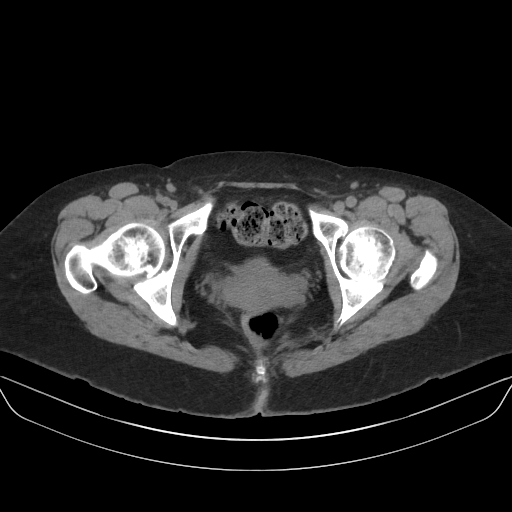
[im 38/142  soft-tissue]
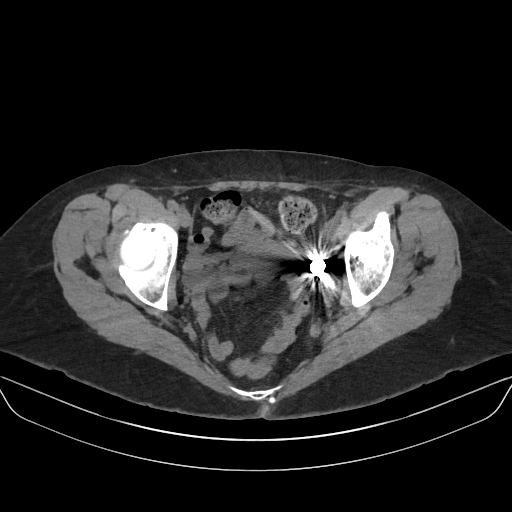
[im 57/142  soft-tissue]
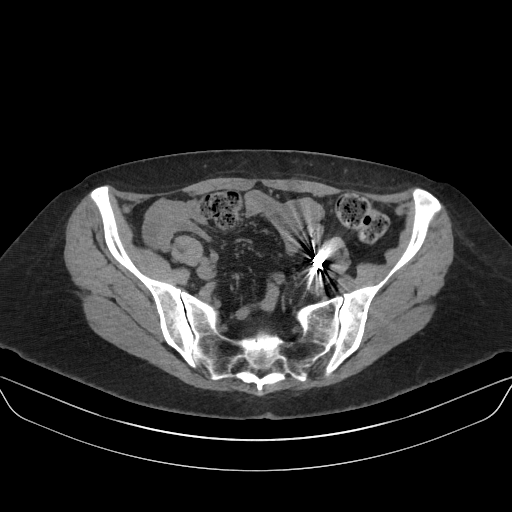
[im 76/142  soft-tissue]
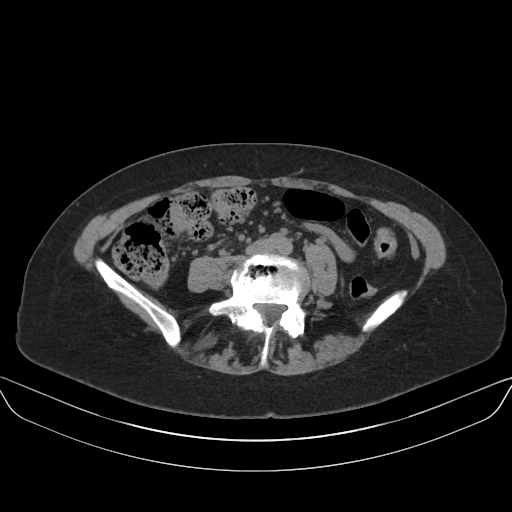
[im 85/142  soft-tissue]
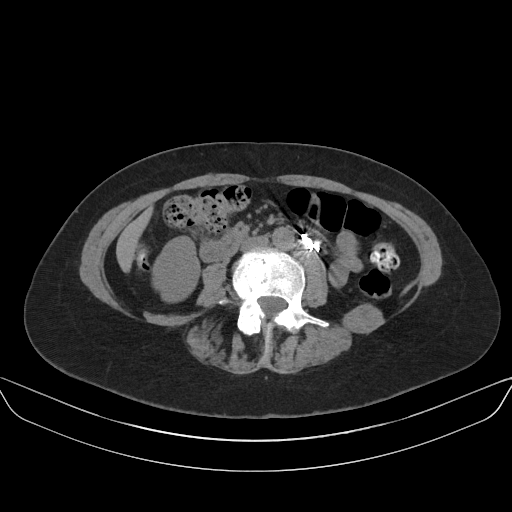
[im 104/142  soft-tissue]
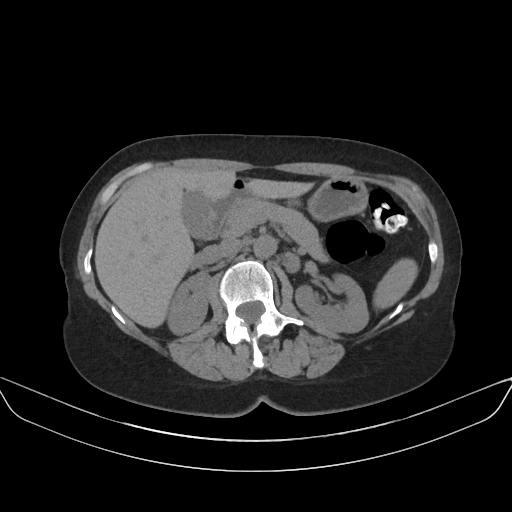
[im 104/142  lung]
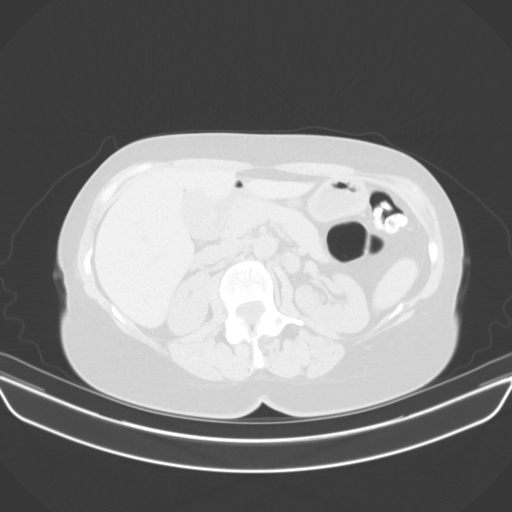
[im 113/142  soft-tissue]
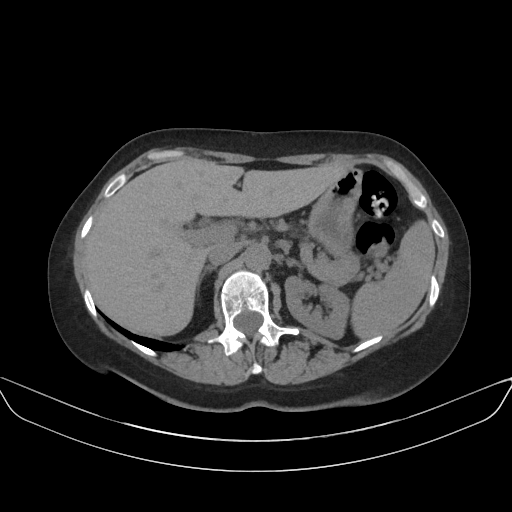
[im 113/142  lung]
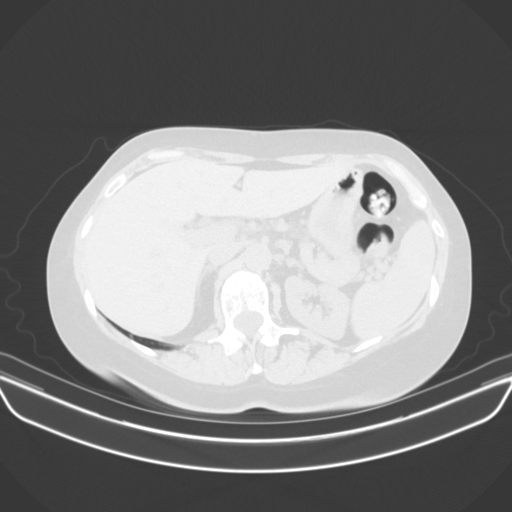
[im 123/142  lung]
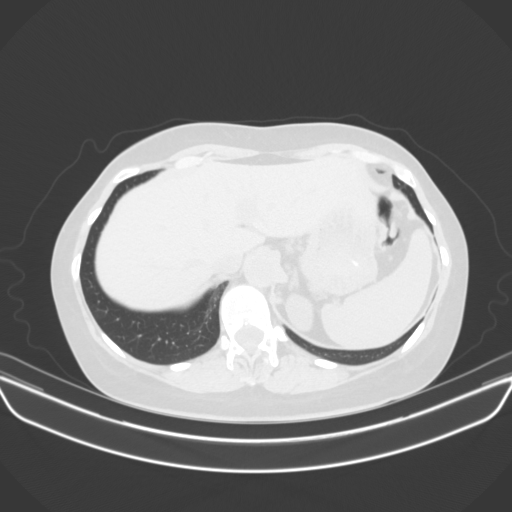
[im 132/142  soft-tissue]
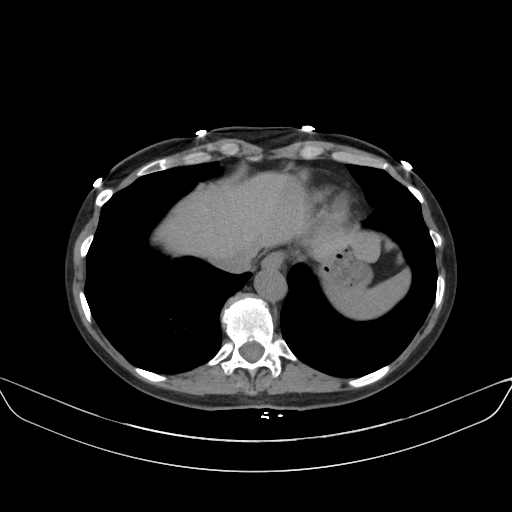
[im 132/142  lung]
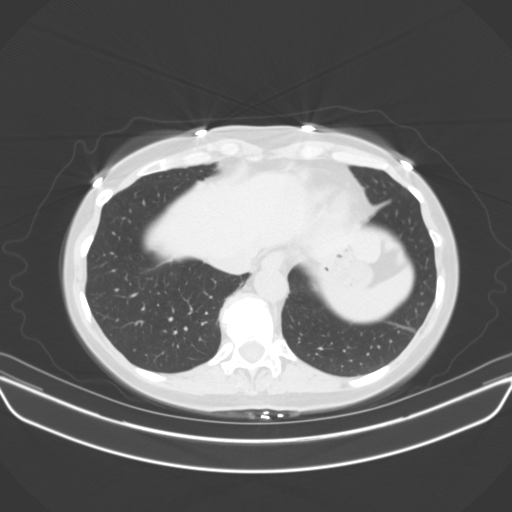
[im 132/142  bone]
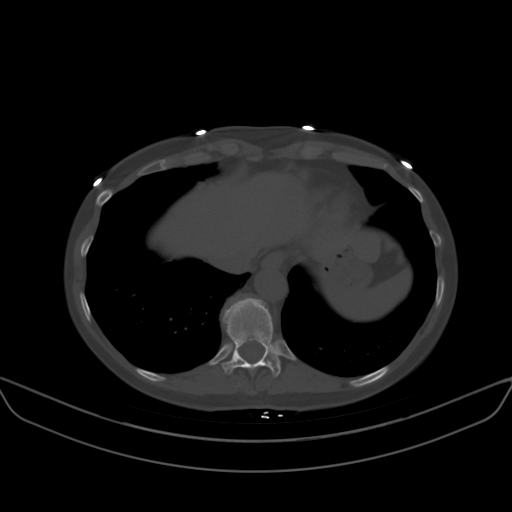

[Series 4: abd/pel cor · coronal · 0.65mm/px · 3 of 112 slices shown]
[im 38/112  soft-tissue]
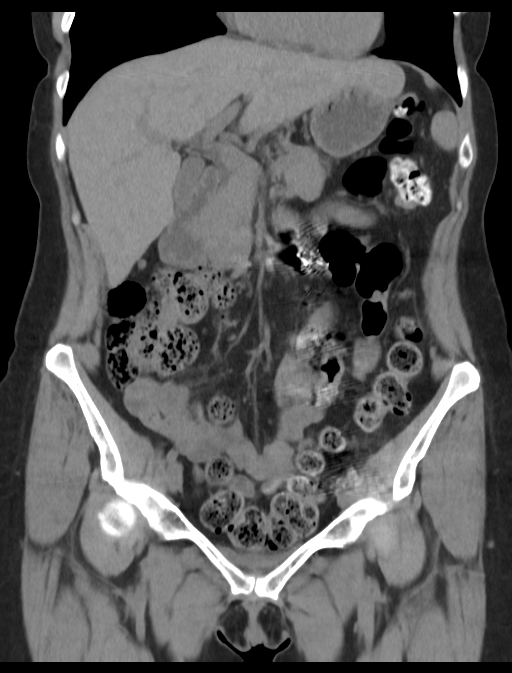
[im 50/112  soft-tissue]
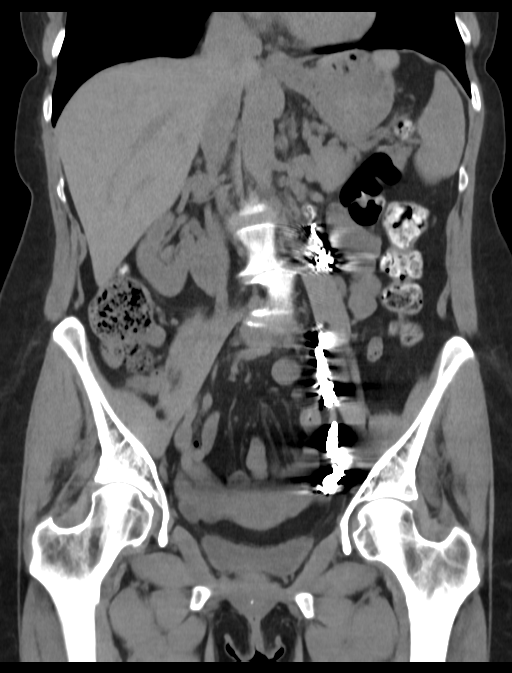
[im 62/112  soft-tissue]
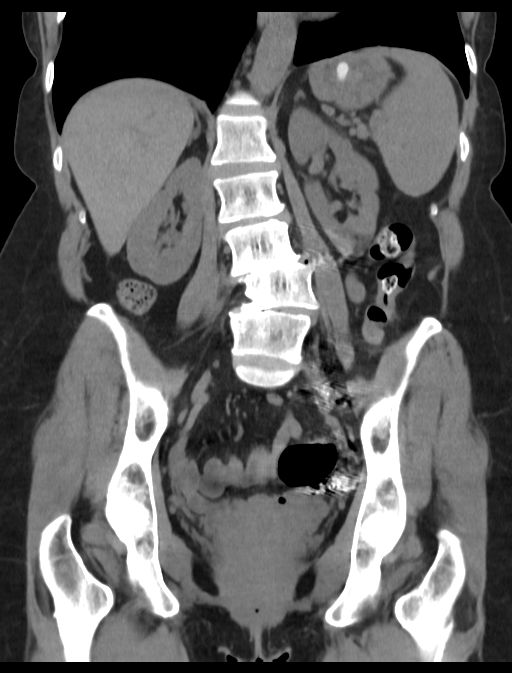

[13 of 42 positions shown; findings below may reference images not displayed]

FINDINGS: LUNG BASES: Mild left basilar atelectasis. 
HEPATOBILIARY: Non contrast images show no mass or biliary dilatation. No 
gallstones. 
SPLEEN: Normal in size. 
PANCREAS: No evidence for ductal dilatation or mass.   
ADRENALS: No mass. 
GENITOURINARY: No hydronephrosis or kidney stones. Bladder is unremarkable. 
LYMPH NODES: No adenopathy. 
STOMACH, SMALL BOWEL AND COLON: No bowel wall thickening or obstruction. 
VASCULAR STRUCTURES: Previous embolization of the left ovarian vein.  
MUSCULOSKELETAL: Stable appearance to the L4 hemangioma. Degenerative changes.  
ADDITIONAL FINDINGS: Uterus and adnexa are unremarkable in appearance.
IMPRESSION: No new abnormality seen. Findings similar to exam from 6963. 
RADIATION DOSE REDUCTION: All CT scans are performed using radiation dose 
reduction techniques, when applicable.  Technical factors are evaluated and 
adjusted to ensure appropriate moderation of exposure.  Automated dose 
management technology is applied to adjust the radiation doses to minimize 
exposure while achieving diagnostic quality images.

## 2023-08-18 IMAGING — MG MAMMOGRAPHY SCREENING BILATERAL 3[PERSON_NAME]
8 series · 9 of 24 positions shown · non-contrast
Comparison: MR exam of 02/08/2020 and mammogram of 01/14/2020

________________________________________________________________________________________________ 
MAMMOGRAPHY SCREENING BILATERAL 3LABELLE SALHA BRACK TIGER, 08/18/2023 [DATE]: 
CLINICAL INDICATION: Encounter for screening mammogram. History of benign right 
breast biopsy.
TECHNIQUE: Digital bilateral mammograms and 3-D Tomosynthesis were obtained. 
These were interpreted both primarily and with the aid of computer-aided 
detection system.  
BREAST DENSITY: (Level C) The breasts are heterogeneously dense, which may 
obscure small masses.

[R CC]
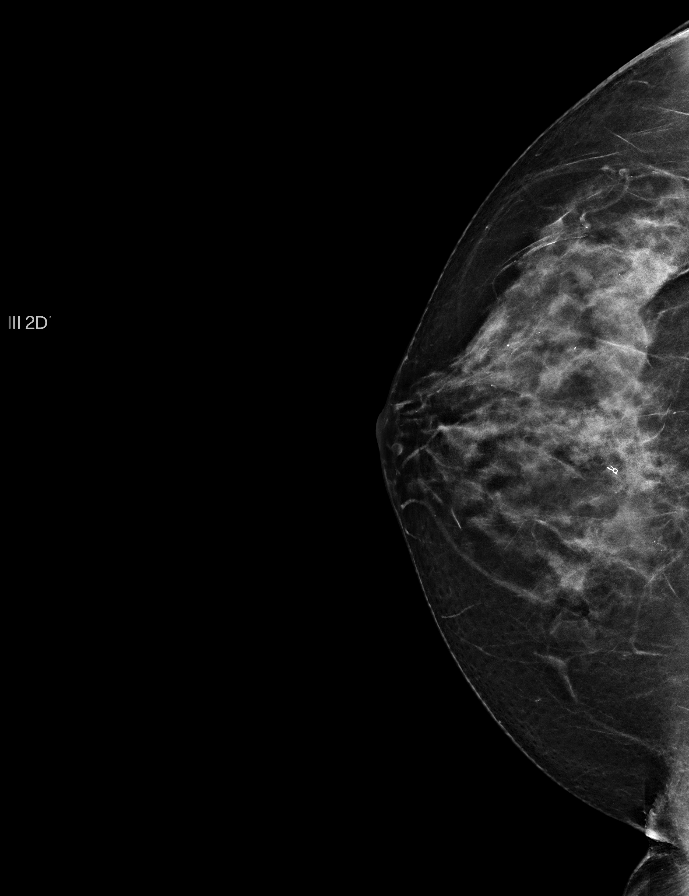

[R MLO]
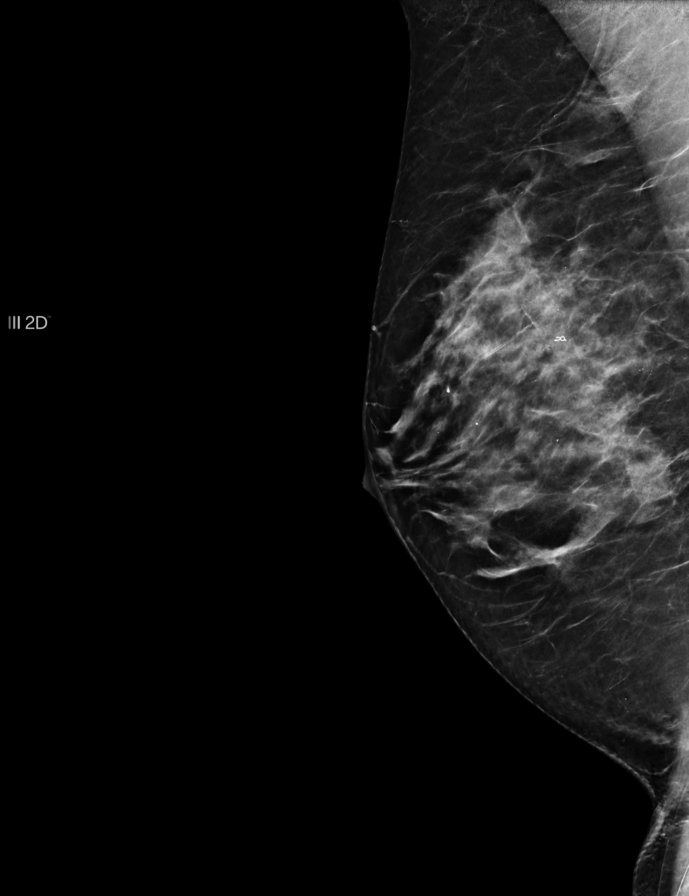

[L CC]
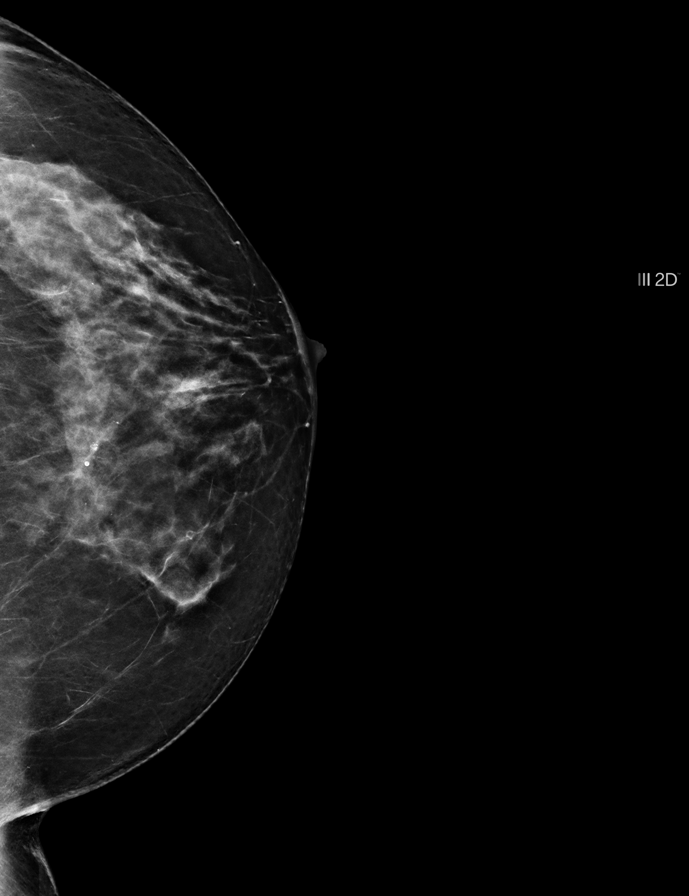

[L MLO]
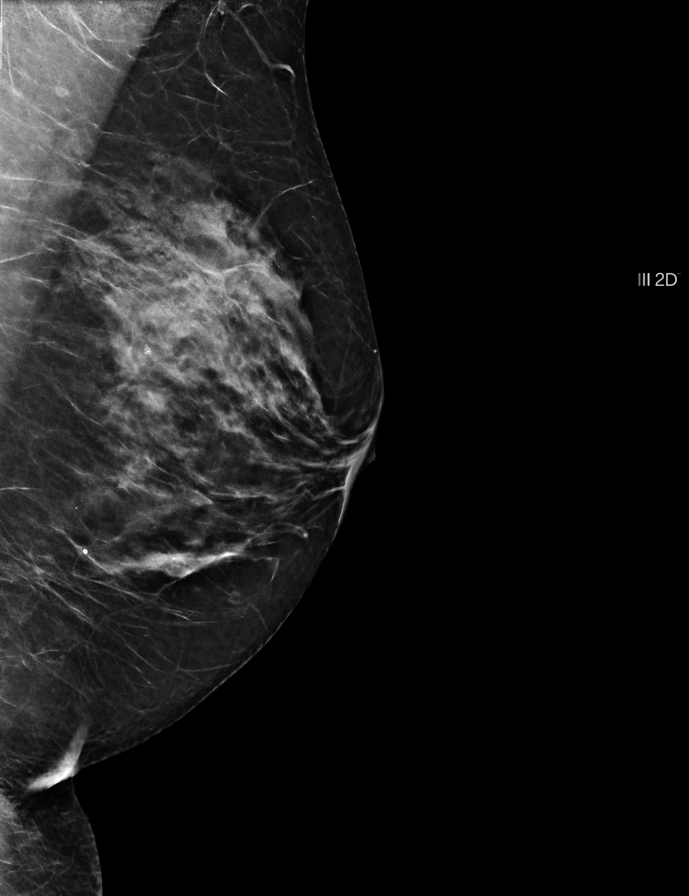

[L MLO tomo · 2 of 21 frames shown]
[frame 7/21]
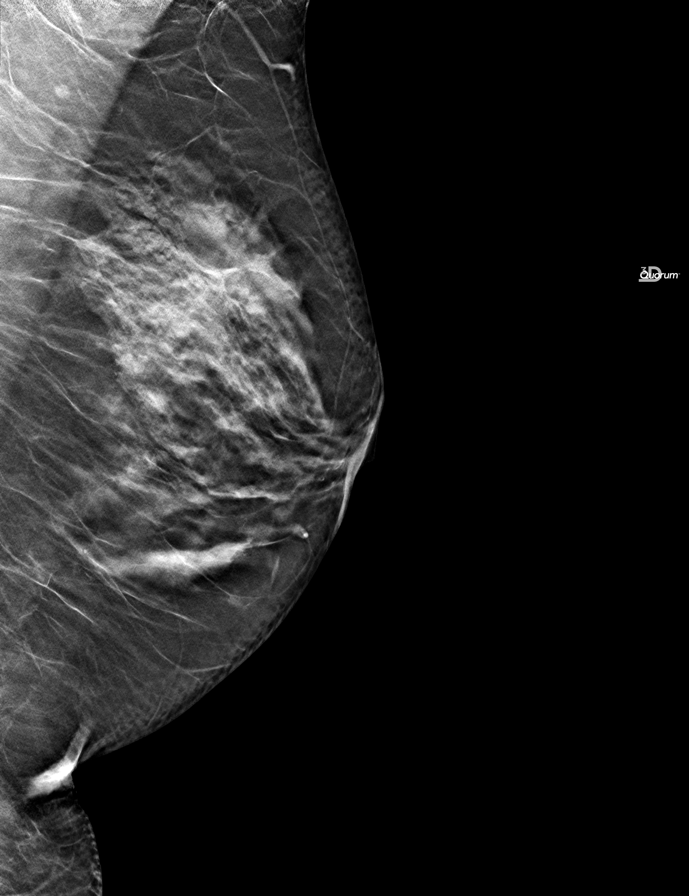
[frame 11/21]
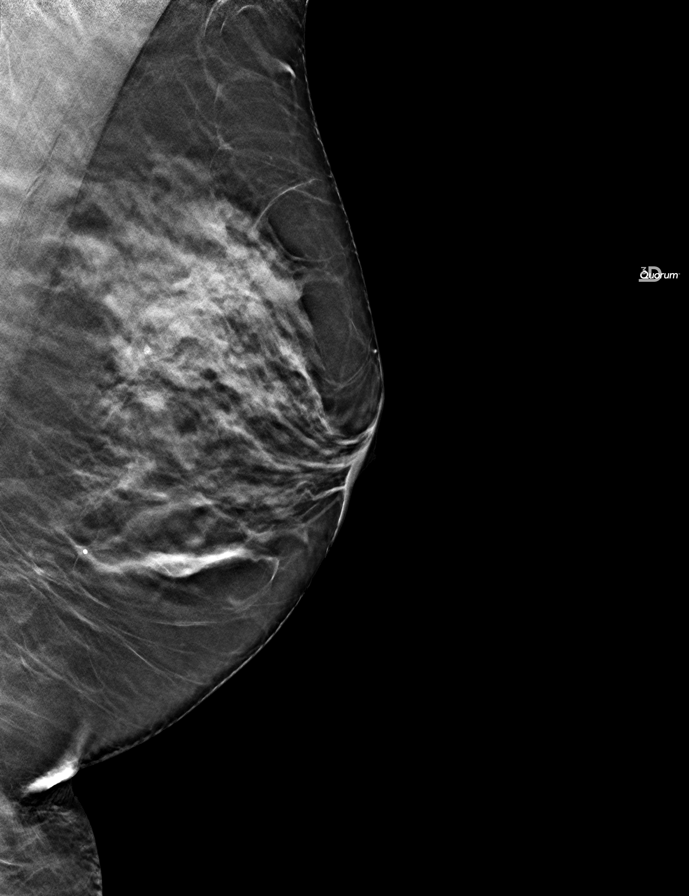

[R MLO tomo · tomo slice 11/21.0]
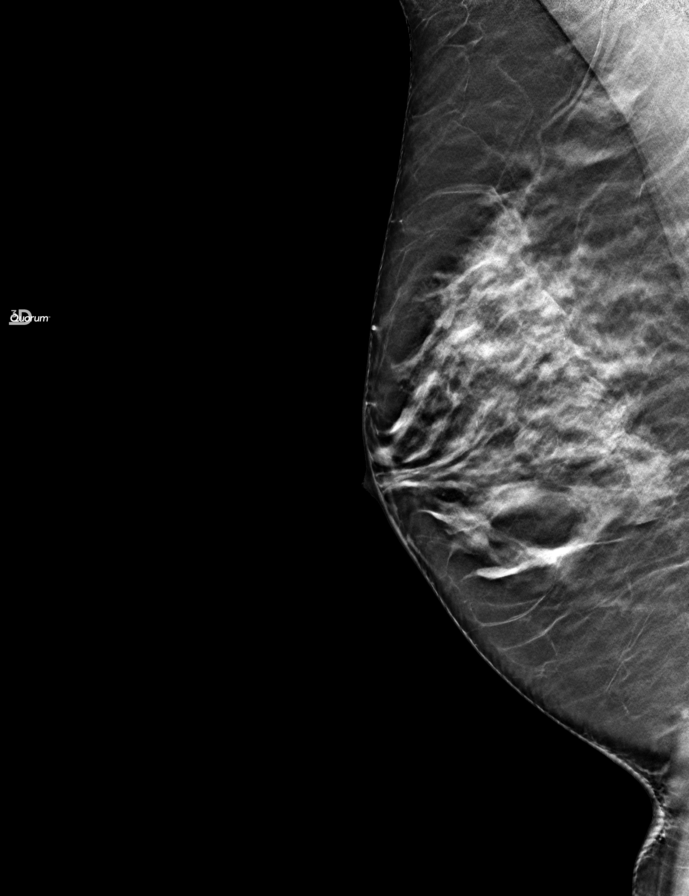

[R CC tomo · tomo slice 11/21.0]
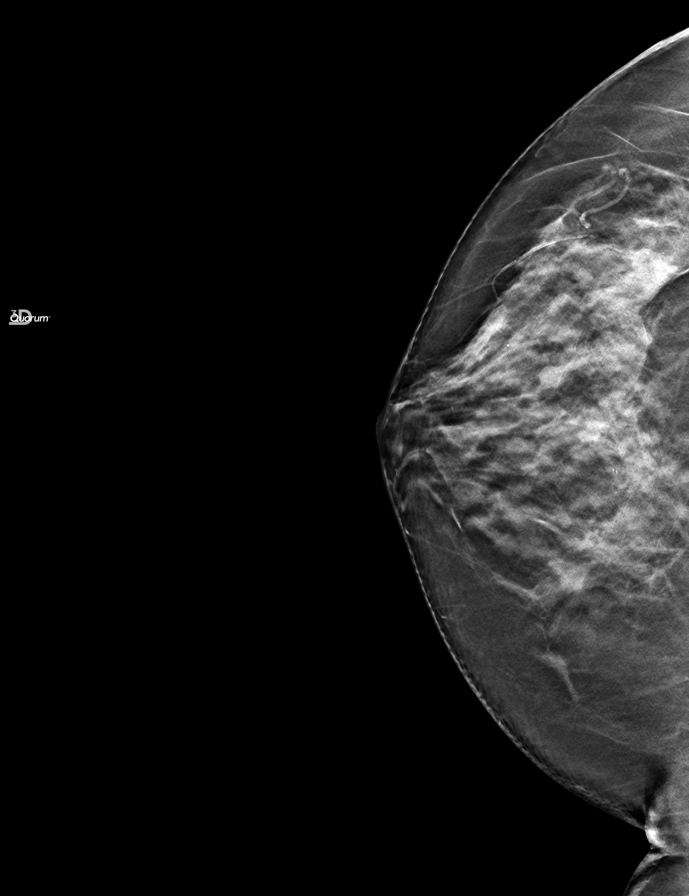

[L CC tomo · tomo slice 11/22.0]
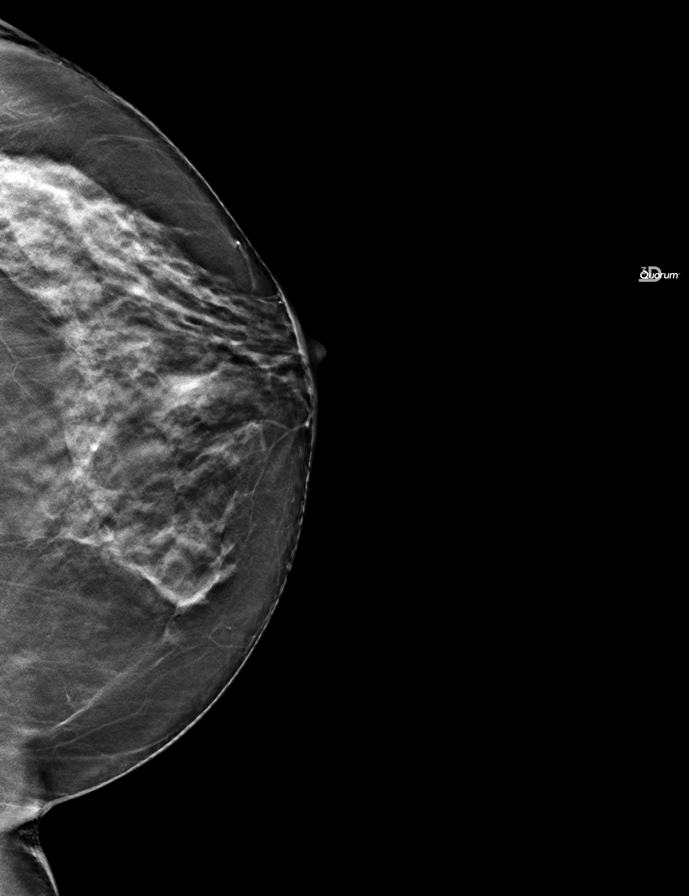

[9 of 24 positions shown; findings below may reference images not displayed]

FINDINGS: No suspicious mass, calcifications, or area of architectural 
distortion in either breast. Postbiopsy tissue marker right breast. Overall 
stable mammographic appearance.
IMPRESSION: No mammographic findings suggestive for malignancy. 
(BI-RADS 2) Benign findings. Routine mammographic follow-up is recommended.

## 2024-03-09 ENCOUNTER — Encounter (INDEPENDENT_AMBULATORY_CARE_PROVIDER_SITE_OTHER): Payer: Self-pay
